# Patient Record
Sex: Male | Born: 1940 | Race: White | Hispanic: No | Marital: Married | Smoking: Former smoker
Health system: Southern US, Community
[De-identification: ages and names within clinical notes are randomized; demographics above are authoritative.]

## PROBLEM LIST (undated history)

## (undated) DIAGNOSIS — E785 Hyperlipidemia, unspecified: Secondary | ICD-10-CM

## (undated) DIAGNOSIS — Z860100 Personal history of colon polyps, unspecified: Secondary | ICD-10-CM

## (undated) DIAGNOSIS — M1990 Unspecified osteoarthritis, unspecified site: Secondary | ICD-10-CM

## (undated) DIAGNOSIS — C801 Malignant (primary) neoplasm, unspecified: Secondary | ICD-10-CM

## (undated) DIAGNOSIS — I493 Ventricular premature depolarization: Secondary | ICD-10-CM

## (undated) DIAGNOSIS — C4490 Unspecified malignant neoplasm of skin, unspecified: Secondary | ICD-10-CM

## (undated) DIAGNOSIS — J449 Chronic obstructive pulmonary disease, unspecified: Secondary | ICD-10-CM

## (undated) DIAGNOSIS — I8290 Acute embolism and thrombosis of unspecified vein: Secondary | ICD-10-CM

## (undated) DIAGNOSIS — Z1501 Genetic susceptibility to malignant neoplasm of breast: Secondary | ICD-10-CM

## (undated) DIAGNOSIS — Z800 Family history of malignant neoplasm of digestive organs: Secondary | ICD-10-CM

## (undated) DIAGNOSIS — T148XXA Other injury of unspecified body region, initial encounter: Secondary | ICD-10-CM

## (undated) DIAGNOSIS — Z973 Presence of spectacles and contact lenses: Secondary | ICD-10-CM

## (undated) DIAGNOSIS — N433 Hydrocele, unspecified: Secondary | ICD-10-CM

## (undated) DIAGNOSIS — I350 Nonrheumatic aortic (valve) stenosis: Secondary | ICD-10-CM

## (undated) DIAGNOSIS — T7840XA Allergy, unspecified, initial encounter: Secondary | ICD-10-CM

## (undated) DIAGNOSIS — R011 Cardiac murmur, unspecified: Secondary | ICD-10-CM

## (undated) DIAGNOSIS — Z1509 Genetic susceptibility to other malignant neoplasm: Principal | ICD-10-CM

## (undated) DIAGNOSIS — D649 Anemia, unspecified: Secondary | ICD-10-CM

## (undated) DIAGNOSIS — I6523 Occlusion and stenosis of bilateral carotid arteries: Secondary | ICD-10-CM

## (undated) DIAGNOSIS — Z803 Family history of malignant neoplasm of breast: Secondary | ICD-10-CM

## (undated) DIAGNOSIS — H9190 Unspecified hearing loss, unspecified ear: Secondary | ICD-10-CM

## (undated) DIAGNOSIS — I10 Essential (primary) hypertension: Secondary | ICD-10-CM

## (undated) HISTORY — DX: Family history of malignant neoplasm of breast: Z80.3

## (undated) HISTORY — PX: COLONOSCOPY: SHX174

## (undated) HISTORY — DX: Unspecified osteoarthritis, unspecified site: M19.90

## (undated) HISTORY — DX: Genetic susceptibility to malignant neoplasm of breast: Z15.01

## (undated) HISTORY — PX: TRANSTHORACIC ECHOCARDIOGRAM: SHX275

## (undated) HISTORY — DX: Family history of malignant neoplasm of digestive organs: Z80.0

## (undated) HISTORY — DX: Malignant (primary) neoplasm, unspecified: C80.1

## (undated) HISTORY — DX: Genetic susceptibility to other malignant neoplasm: Z15.09

## (undated) HISTORY — PX: STAPEDECTOMY: SHX2435

---

## 1960-06-07 HISTORY — PX: TONSILLECTOMY: SUR1361

## 1999-07-24 ENCOUNTER — Ambulatory Visit (HOSPITAL_COMMUNITY): Admission: RE | Admit: 1999-07-24 | Discharge: 1999-07-24 | Payer: Self-pay | Admitting: Orthopaedic Surgery

## 2001-02-24 ENCOUNTER — Other Ambulatory Visit: Admission: RE | Admit: 2001-02-24 | Discharge: 2001-02-24 | Payer: Self-pay | Admitting: Gastroenterology

## 2002-06-07 HISTORY — PX: SHOULDER SURGERY: SHX246

## 2004-04-09 ENCOUNTER — Ambulatory Visit: Payer: Self-pay | Admitting: Gastroenterology

## 2004-04-23 ENCOUNTER — Ambulatory Visit: Payer: Self-pay | Admitting: Gastroenterology

## 2005-12-07 ENCOUNTER — Ambulatory Visit (HOSPITAL_COMMUNITY): Admission: RE | Admit: 2005-12-07 | Discharge: 2005-12-07 | Payer: Self-pay | Admitting: Family Medicine

## 2010-08-25 ENCOUNTER — Encounter: Payer: Self-pay | Admitting: Cardiology

## 2010-09-04 ENCOUNTER — Encounter: Payer: Self-pay | Admitting: Cardiology

## 2010-09-04 ENCOUNTER — Ambulatory Visit (INDEPENDENT_AMBULATORY_CARE_PROVIDER_SITE_OTHER): Payer: Medicare Other | Admitting: Cardiology

## 2010-09-04 ENCOUNTER — Encounter: Payer: Self-pay | Admitting: *Deleted

## 2010-09-04 VITALS — BP 176/86 | HR 80 | Resp 18 | Ht 73.0 in | Wt 189.4 lb

## 2010-09-04 DIAGNOSIS — R0989 Other specified symptoms and signs involving the circulatory and respiratory systems: Secondary | ICD-10-CM | POA: Insufficient documentation

## 2010-09-04 DIAGNOSIS — I6529 Occlusion and stenosis of unspecified carotid artery: Secondary | ICD-10-CM

## 2010-09-04 DIAGNOSIS — R002 Palpitations: Secondary | ICD-10-CM

## 2010-09-04 DIAGNOSIS — R011 Cardiac murmur, unspecified: Secondary | ICD-10-CM

## 2010-09-04 LAB — TSH: TSH: 1.46 u[IU]/mL (ref 0.35–5.50)

## 2010-09-04 NOTE — Assessment & Plan Note (Signed)
Soft murmur at the apex, probably benign, will check to rule out HOCM or MVP

## 2010-09-04 NOTE — Progress Notes (Signed)
   Patient ID: Ronnie Nicholson, male    DOB: June 07, 1941, 70 y.o.   MRN: [id number]  HPI  Ronnie Nicholson is referred today for palpitation. He has had these for years off and on. I evaluated these 10 plus years ago and found them to be benign. They occur at rest or with lying down, never with activity. No chest pain, SOB, or DOE. He does not use excess caffeine or stimulants. Does not smoke. Lab work from Dr Gerda Diss reviewed yet do not see TSH. No active sxs to suggest hyperthyroidism. Outside EKG and today's EKG are normal.   Review of Systems  [all other systems reviewed and are negative      Physical Exam  Constitutional: He is oriented to person, place, and time. He appears well-developed and well-nourished.  HENT:  Head: Normocephalic and atraumatic.  Eyes: EOM are normal. Pupils are equal, round, and reactive to light.  Neck: Normal range of motion. No JVD present. No tracheal deviation present. No thyromegaly present.  Cardiovascular: Normal rate, regular rhythm and intact distal pulses.        Soft mid systolic murmur at apex  Pulmonary/Chest: Effort normal and breath sounds normal.       Pectus excavatum  Abdominal: Soft. Bowel sounds are normal.  Musculoskeletal: Normal range of motion. He exhibits no edema and no tenderness.  Neurological: He is alert and oriented to person, place, and time.  Skin: Skin is warm and dry.  Psychiatric: He has a normal mood and affect. His behavior is normal. Judgment and thought content normal.

## 2010-09-04 NOTE — Assessment & Plan Note (Signed)
Probably benign, 48hr holter monitor to confirm

## 2010-09-04 NOTE — Assessment & Plan Note (Signed)
Newly heard, check carotids

## 2010-09-04 NOTE — Patient Instructions (Signed)
Your physician recommends that you schedule a follow-up appointment in:As needed.  Your physician has recommended that you wear a holter monitor for 48 hours. Holter monitors are medical devices that record the heart's electrical activity. Doctors most often use these monitors to diagnose arrhythmias. Arrhythmias are problems with the speed or rhythm of the heartbeat. The monitor is a small, portable device. You can wear one while you do your normal daily activities. This is usually used to diagnose what is causing palpitations/syncope (passing out).  Your physician has requested that you have a carotid duplex. This test is an ultrasound of the carotid arteries in your neck. It looks at blood flow through these arteries that supply the brain with blood. Allow one hour for this exam. There are no restrictions or special instructions.  Your physician has requested that you have an echocardiogram. Echocardiography is a painless test that uses sound waves to create images of your heart. It provides your doctor with information about the size and shape of your heart and how well your heart's chambers and valves are working. This procedure takes approximately one hour. There are no restrictions for this procedure.  Your physician recommends that you return for lab work in: To have TSH drawn today-Dx 785.1.

## 2010-09-08 ENCOUNTER — Other Ambulatory Visit: Payer: Self-pay | Admitting: *Deleted

## 2010-09-08 ENCOUNTER — Encounter (INDEPENDENT_AMBULATORY_CARE_PROVIDER_SITE_OTHER): Payer: Medicare Other | Admitting: *Deleted

## 2010-09-08 ENCOUNTER — Ambulatory Visit (HOSPITAL_COMMUNITY): Payer: Medicare Other | Attending: Cardiology | Admitting: Radiology

## 2010-09-08 ENCOUNTER — Encounter (INDEPENDENT_AMBULATORY_CARE_PROVIDER_SITE_OTHER): Payer: Medicare Other

## 2010-09-08 DIAGNOSIS — I6529 Occlusion and stenosis of unspecified carotid artery: Secondary | ICD-10-CM

## 2010-09-08 DIAGNOSIS — R011 Cardiac murmur, unspecified: Secondary | ICD-10-CM | POA: Insufficient documentation

## 2010-09-08 DIAGNOSIS — R002 Palpitations: Secondary | ICD-10-CM

## 2010-09-14 ENCOUNTER — Encounter: Payer: Self-pay | Admitting: Cardiology

## 2010-09-15 ENCOUNTER — Telehealth: Payer: Self-pay | Admitting: *Deleted

## 2010-09-15 NOTE — Telephone Encounter (Signed)
Pt is aware of monitor results and pvcs.  We discussed this last week when he was in for his echo.  He states for the past 3-4 days he has felt fine with no ectopy.  He will call back if the pvcs become "worrisome or aggravating". Mylo Red RN

## 2010-10-20 ENCOUNTER — Ambulatory Visit (INDEPENDENT_AMBULATORY_CARE_PROVIDER_SITE_OTHER): Payer: Medicare Other | Admitting: Urology

## 2010-10-20 DIAGNOSIS — N4340 Spermatocele of epididymis, unspecified: Secondary | ICD-10-CM

## 2010-11-03 ENCOUNTER — Encounter: Payer: Self-pay | Admitting: Cardiology

## 2010-11-04 ENCOUNTER — Encounter: Payer: Self-pay | Admitting: Cardiology

## 2011-04-05 ENCOUNTER — Encounter: Payer: Self-pay | Admitting: Gastroenterology

## 2011-05-12 ENCOUNTER — Encounter: Payer: Self-pay | Admitting: Internal Medicine

## 2011-05-20 ENCOUNTER — Encounter: Payer: Self-pay | Admitting: Cardiology

## 2011-05-20 ENCOUNTER — Ambulatory Visit (INDEPENDENT_AMBULATORY_CARE_PROVIDER_SITE_OTHER): Payer: Medicare Other | Admitting: Cardiology

## 2011-05-20 VITALS — BP 136/74 | HR 77 | Ht 73.0 in | Wt 193.0 lb

## 2011-05-20 DIAGNOSIS — R011 Cardiac murmur, unspecified: Secondary | ICD-10-CM

## 2011-05-20 DIAGNOSIS — I493 Ventricular premature depolarization: Secondary | ICD-10-CM | POA: Insufficient documentation

## 2011-05-20 DIAGNOSIS — I350 Nonrheumatic aortic (valve) stenosis: Secondary | ICD-10-CM

## 2011-05-20 DIAGNOSIS — R002 Palpitations: Secondary | ICD-10-CM

## 2011-05-20 DIAGNOSIS — R0989 Other specified symptoms and signs involving the circulatory and respiratory systems: Secondary | ICD-10-CM

## 2011-05-20 DIAGNOSIS — I4949 Other premature depolarization: Secondary | ICD-10-CM

## 2011-05-20 DIAGNOSIS — I359 Nonrheumatic aortic valve disorder, unspecified: Secondary | ICD-10-CM

## 2011-05-20 DIAGNOSIS — I779 Disorder of arteries and arterioles, unspecified: Secondary | ICD-10-CM

## 2011-05-20 MED ORDER — METOPROLOL TARTRATE 25 MG PO TABS: 25.0000 mg | ORAL_TABLET | Freq: Two times a day (BID) | ORAL | Status: DC

## 2011-05-20 NOTE — Assessment & Plan Note (Signed)
These are clearly related to PVCs. Will begin metoprolol tartrate 25 mg p.o. Twice a day. Hopefully, they will decrease by 75% or more. Expectations realistically given the patient

## 2011-05-20 NOTE — Patient Instructions (Signed)
Your physician has recommended you make the following change in your medication:  start Metoprolol  Your physician wants you to follow-up in: 4 months with Dr. Daleen Squibb. ( April) You will receive a reminder letter in the mail two months in advance. If you don't receive a letter, please call our office to schedule the follow-up appointment.

## 2011-05-20 NOTE — Progress Notes (Signed)
HPI  Mr. Ronnie Nicholson comes in today for increased palpitations. It almost daily over the last couple weeks. He only notices when he sitting down or lying down.  He has mild aortic stenosis and normal left ventricular function by echocardiogram in April of this year. He is nonobstructive carotid disease. We'll monitor x48 hours showed noncomplex premature ventricular contractions. Reassurance was given. At the time he was relatively asymptomatic. Past Medical History  Diagnosis Date  . Irregular heart rate   . Colon polyps   . Hypercholesteremia     Current Outpatient Prescriptions  Medication Sig Dispense Refill  . aspirin 81 MG tablet Take 81 mg by mouth daily.        . fish oil-omega-3 fatty acids 1000 MG capsule Take by mouth daily. 1200mg  daily       . Pitavastatin Calcium (LIVALO) 1 MG TABS Take 1 mg by mouth daily.        . metoprolol tartrate (LOPRESSOR) 25 MG tablet Take 1 tablet (25 mg total) by mouth 2 (two) times daily.  180 tablet  3    Allergies  Allergen Reactions  . Flexeril (Cyclobenzaprine Hcl)   . Lodine (Etodolac)   . Tetracyclines & Related     No family history on file.  History   Social History  . Marital Status: Married    Spouse Name: N/A    Number of Children: N/A  . Years of Education: N/A   Occupational History  . retired     08-2003   Social History Main Topics  . Smoking status: Former Games developer  . Smokeless tobacco: Not on file   Comment: quit in 1985  . Alcohol Use: Not on file  . Drug Use: Not on file  . Sexually Active: Not on file   Other Topics Concern  . Not on file   Social History Narrative  . No narrative on file    ROS ALL NEGATIVE EXCEPT THOSE NOTED IN HPI  PE  General Appearance: well developed, well nourished in no acute distress HEENT: symmetrical face, PERRLA, good dentition  Neck: no JVD, thyromegaly, or adenopathy, trachea midline Chest: symmetric without deformity Cardiac: PMI non-displaced, Irregular rate and  rhythm normal S1, S2, no gallop or murmur Lung: clear to ausculation and percussion Vascular: all pulses full without bruits  Abdominal: nondistended, nontender, good bowel sounds, no HSM, no bruits Extremities: no cyanosis, clubbing or edema, no sign of DVT, no varicosities  Skin: normal color, no rashes Neuro: alert and oriented x 3, non-focal Pysch: normal affect  EKG Normal sinus rhythm with frequent ventricular ectopic beats, unifocal BMET No results found for this basename: na, k, cl, co2, glucose, bun, creatinine, calcium, gfrnonaa, gfraa    Lipid Panel  No results found for this basename: chol, trig, hdl, cholhdl, vldl, ldlcalc    CBC No results found for this basename: wbc, rbc, hgb, hct, plt, mcv, mch, mchc, rdw, neutrabs, lymphsabs, monoabs, eosabs, basosabs

## 2011-06-18 ENCOUNTER — Ambulatory Visit (AMBULATORY_SURGERY_CENTER): Payer: Medicare Other | Admitting: *Deleted

## 2011-06-18 VITALS — Ht 73.0 in | Wt 197.6 lb

## 2011-06-18 DIAGNOSIS — Z1211 Encounter for screening for malignant neoplasm of colon: Secondary | ICD-10-CM

## 2011-06-18 MED ORDER — PEG-KCL-NACL-NASULF-NA ASC-C 100 G PO SOLR: ORAL | Status: DC

## 2011-07-01 ENCOUNTER — Ambulatory Visit (AMBULATORY_SURGERY_CENTER): Payer: Medicare Other | Admitting: Internal Medicine

## 2011-07-01 ENCOUNTER — Encounter: Payer: Self-pay | Admitting: Internal Medicine

## 2011-07-01 VITALS — BP 159/81 | HR 98 | Temp 97.9°F | Resp 16 | Ht 73.0 in | Wt 197.0 lb

## 2011-07-01 DIAGNOSIS — Z1211 Encounter for screening for malignant neoplasm of colon: Secondary | ICD-10-CM

## 2011-07-01 DIAGNOSIS — Z860100 Personal history of colon polyps, unspecified: Secondary | ICD-10-CM | POA: Insufficient documentation

## 2011-07-01 DIAGNOSIS — D126 Benign neoplasm of colon, unspecified: Secondary | ICD-10-CM

## 2011-07-01 DIAGNOSIS — Z800 Family history of malignant neoplasm of digestive organs: Secondary | ICD-10-CM

## 2011-07-01 MED ORDER — SODIUM CHLORIDE 0.9 % IV SOLN: 500.0000 mL | INTRAVENOUS | Status: DC

## 2011-07-01 NOTE — Patient Instructions (Addendum)
One tiny 2 mm polyp was removed. Not to worry. Will send a letter about the results. Can consider a repeat colonoscopy in 5 years or so given your personal history of polyps and your family history. Iva Boop, MD, [facility]  Green and blue discharge instructions reviewed with patient and care partner.  Handouts given for polyps and hemorrhoids.  Resume medications as you were taking them prior to your procedure.

## 2011-07-01 NOTE — Progress Notes (Signed)
Patient did not experience any of the following events: a burn prior to discharge; a fall within the facility; wrong site/side/patient/procedure/implant event; or a hospital transfer or hospital admission upon discharge from the facility. (G8907) Patient did not have preoperative order for IV antibiotic SSI prophylaxis. (G8918)  

## 2011-07-01 NOTE — Op Note (Signed)
[facility] [address] [place], [place]  [address]  COLONOSCOPY PROCEDURE REPORT  PATIENT:  Ronnie Nicholson, Ronnie Nicholson  MR#:  [id number] BIRTHDATE:  1940-12-18, 70 yrs. old  GENDER:  male ENDOSCOPIST:  Iva Boop, MD, [facility]  PROCEDURE DATE:  07/01/2011 PROCEDURE:  Colonoscopy with biopsy ASA CLASS:  Class II INDICATIONS:  surveillance and high-risk screening, history of pre-cancerous (adenomatous) colon polyps, family history of colon cancer MEDICATIONS:   These medications were titrated to patient response per physician's verbal order, Fentanyl 50 mcg IV, Versed 6 mg IV  DESCRIPTION OF PROCEDURE:   After the risks benefits and alternatives of the procedure were thoroughly explained, informed consent was obtained.  Digital rectal exam was performed and revealed no abnormalities and normal prostate.   The [id number] endoscope was introduced through the anus and advanced to the cecum, which was identified by both the appendix and ileocecal valve, without limitations.  The quality of the prep was excellent, using MoviPrep.  The instrument was then slowly withdrawn as the colon was fully examined. <<PROCEDUREIMAGES>>  FINDINGS:  A diminutive polyp was found at the splenic flexure. 2 mm polyp removed with cold biopsy.  This was otherwise a normal examination of the colon.   Retroflexed views in the rectum revealed internal and external hemorrhoids.    The time to cecum = 2:58 minutes. The scope was then withdrawn in 10:37 minutes from the cecum and the procedure completed. COMPLICATIONS:  None ENDOSCOPIC IMPRESSION: 1) Diminutive polyp at the splenic flexure - removed 2) Internal and external hemorrhoids 3) Otherwise normal examination, excellent prep 4) Personal history of adenomas and family history colon cancer (father and brother)  REPEAT EXAM:  In for Colonoscopy, pending biopsy results. Consider repeat in 5 years likely  Iva Boop, MD, [facility]  CC:  The  Patient and Simone Curia, Md  n. eSIGNED:   Iva Boop at 07/01/2011 09:50 AM  Wilhelmenia Blase, [phone]

## 2011-07-02 ENCOUNTER — Telehealth: Payer: Self-pay

## 2011-07-02 NOTE — Telephone Encounter (Signed)

## 2011-07-07 ENCOUNTER — Encounter: Payer: Self-pay | Admitting: Internal Medicine

## 2011-07-07 NOTE — Progress Notes (Signed)
Quick Note:  Small adenoma and prior adenomas + family hx colon cancer Repeat colonoscopy approx 5 years 06/2016 ______

## 2011-10-07 ENCOUNTER — Other Ambulatory Visit: Payer: Self-pay | Admitting: *Deleted

## 2011-10-07 DIAGNOSIS — I6529 Occlusion and stenosis of unspecified carotid artery: Secondary | ICD-10-CM

## 2011-10-08 ENCOUNTER — Encounter (INDEPENDENT_AMBULATORY_CARE_PROVIDER_SITE_OTHER): Payer: Medicare Other

## 2011-10-08 DIAGNOSIS — I6529 Occlusion and stenosis of unspecified carotid artery: Secondary | ICD-10-CM

## 2011-10-15 ENCOUNTER — Encounter: Payer: Self-pay | Admitting: Cardiology

## 2011-10-15 ENCOUNTER — Ambulatory Visit (INDEPENDENT_AMBULATORY_CARE_PROVIDER_SITE_OTHER): Payer: Medicare Other | Admitting: Cardiology

## 2011-10-15 VITALS — BP 148/78 | HR 62 | Ht 73.0 in | Wt 195.0 lb

## 2011-10-15 DIAGNOSIS — I359 Nonrheumatic aortic valve disorder, unspecified: Secondary | ICD-10-CM

## 2011-10-15 DIAGNOSIS — R002 Palpitations: Secondary | ICD-10-CM

## 2011-10-15 DIAGNOSIS — I4949 Other premature depolarization: Secondary | ICD-10-CM

## 2011-10-15 DIAGNOSIS — I350 Nonrheumatic aortic (valve) stenosis: Secondary | ICD-10-CM

## 2011-10-15 DIAGNOSIS — I779 Disorder of arteries and arterioles, unspecified: Secondary | ICD-10-CM

## 2011-10-15 DIAGNOSIS — I493 Ventricular premature depolarization: Secondary | ICD-10-CM

## 2011-10-15 NOTE — Assessment & Plan Note (Signed)
These are stable. He only has it at rest. Continue low-dose beta blocker. No further cardiac workup at this time. Patient is one developed symptoms of angina or ischemia and how to notify us. I will schedule followup in a year.

## 2011-10-15 NOTE — Assessment & Plan Note (Signed)
Stable by history and exam. Repeat echocardiogram next year return visit. That'll be 2 years out from his last study.

## 2011-10-15 NOTE — Patient Instructions (Signed)
Your physician has requested that you have a carotid duplex. This test is an ultrasound of the carotid arteries in your neck. It looks at blood flow through these arteries that supply the brain with blood. Allow one hour for this exam. There are no restrictions or special instructions. May 2014  Your physician recommends that you continue on your current medications as directed. Please refer to the Current Medication list given to you today.  Your physician wants you to follow-up in: 1 year. You will receive a reminder letter in the mail two months in advance. If you don't receive a letter, please call our office to schedule the follow-up appointment.

## 2011-10-15 NOTE — Progress Notes (Signed)
HPI  Ronnie Nicholson comes in today for evaluation and management of his symptomatic PVCs which cause her to have palpitations, mild aortic stenosis on echocardiogram last year, and bilateral carotid artery disease.  He works extremely hard and is [place] in form. He denies any chest tightness or ischemic equivalent symptoms.  He recently visited his ophthalmologist. Complained of having some spots in the right peripheral vision that lasted for about 10 minutes. He denies any loss of vision or amaurosis. His ophthalmologist thought it might be from his carotid disease. Carotids were just obtained on May 3 and showed slight progression of his plaque but no unstable plaque or irregularity. He is on a statin as well as aspirin. He denies any other symptoms of a TIA or mini stroke. Past Medical History  Diagnosis Date  . Irregular heart rate   . Colon polyps   . Hypercholesteremia   . Arthritis     Current Outpatient Prescriptions  Medication Sig Dispense Refill  . aspirin 81 MG tablet Take 81 mg by mouth daily.        . metoprolol tartrate (LOPRESSOR) 25 MG tablet Take 1 tablet (25 mg total) by mouth 2 (two) times daily.  180 tablet  3  . Pitavastatin Calcium (LIVALO) 1 MG TABS Take 1 mg by mouth daily.         Current Facility-Administered Medications  Medication Dose Route Frequency Provider Last Rate Last Dose  . 0.9 %  sodium chloride infusion  500 mL Intravenous Continuous Iva Boop, MD        Allergies  Allergen Reactions  . Flexeril (Cyclobenzaprine Hcl)     unspecified  . Lodine (Etodolac)     unspecified  . Tetracyclines & Related     unspecified    Family History  Problem Relation Age of Onset  . Colon cancer Father 34  . Colon cancer Brother 70    History   Social History  . Marital Status: Married    Spouse Name: N/A    Number of Children: N/A  . Years of Education: N/A   Occupational History  . retired     08-2003   Social History Main Topics  .  Smoking status: Former Smoker    Quit date: 06/18/1983  . Smokeless tobacco: Never Used   Comment: quit in 1985  . Alcohol Use: No  . Drug Use: No  . Sexually Active: Not on file   Other Topics Concern  . Not on file   Social History Narrative  . No narrative on file    ROS ALL NEGATIVE EXCEPT THOSE NOTED IN HPI  PE  General Appearance: well developed, well nourished in no acute distress HEENT: symmetrical face, PERRLA, good dentition  Neck: no JVD, thyromegaly, or adenopathy, trachea midline Chest: symmetric without deformity Cardiac: PMI non-displaced, RRR, normal S1, S2, no gallop or murmur Lung: clear to ausculation and percussion Vascular: all pulses full without bruits  Abdominal: nondistended, nontender, good bowel sounds, no HSM, no bruits Extremities: no cyanosis, clubbing or edema, no sign of DVT, no varicosities  Skin: normal color, no rashes Neuro: alert and oriented x 3, non-focal Pysch: normal affect  EKG  BMET No results found for this basename: na, k, cl, co2, glucose, bun, creatinine, calcium, gfrnonaa, gfraa    Lipid Panel  No results found for this basename: chol, trig, hdl, cholhdl, vldl, ldlcalc    CBC No results found for this basename: wbc, rbc, hgb, hct, plt, mcv, mch, mchc,  rdw, neutrabs, lymphsabs, monoabs, eosabs, basosabs

## 2011-10-15 NOTE — Assessment & Plan Note (Signed)
His symptoms do not indicate a TIA from his carotid disease. Last carotids showed slight progression but no unstable plaque the ultrasound. Continue aggressive secondary prevention. Symptoms of amaurosis and TIA reviewed with patient.

## 2012-05-10 ENCOUNTER — Other Ambulatory Visit: Payer: Self-pay | Admitting: Cardiology

## 2012-05-10 DIAGNOSIS — I6529 Occlusion and stenosis of unspecified carotid artery: Secondary | ICD-10-CM

## 2012-05-11 ENCOUNTER — Encounter (INDEPENDENT_AMBULATORY_CARE_PROVIDER_SITE_OTHER): Payer: Medicare Other

## 2012-05-11 DIAGNOSIS — I6529 Occlusion and stenosis of unspecified carotid artery: Secondary | ICD-10-CM

## 2012-06-13 ENCOUNTER — Other Ambulatory Visit: Payer: Self-pay | Admitting: *Deleted

## 2012-06-13 MED ORDER — METOPROLOL TARTRATE 25 MG PO TABS: 25.0000 mg | ORAL_TABLET | Freq: Two times a day (BID) | ORAL | Status: DC

## 2012-07-11 ENCOUNTER — Ambulatory Visit (INDEPENDENT_AMBULATORY_CARE_PROVIDER_SITE_OTHER): Payer: Medicare Other | Admitting: Urology

## 2012-07-11 DIAGNOSIS — N433 Hydrocele, unspecified: Secondary | ICD-10-CM

## 2012-10-16 ENCOUNTER — Ambulatory Visit: Payer: Medicare Other | Admitting: Physician Assistant

## 2012-11-13 ENCOUNTER — Ambulatory Visit (HOSPITAL_COMMUNITY)
Admission: RE | Admit: 2012-11-13 | Discharge: 2012-11-13 | Disposition: A | Discharge status: Home / Self Care | Payer: Medicare Other | Source: Ambulatory Visit | Attending: Orthopedic Surgery | Admitting: Orthopedic Surgery

## 2012-11-13 DIAGNOSIS — M542 Cervicalgia: Secondary | ICD-10-CM | POA: Insufficient documentation

## 2012-11-13 DIAGNOSIS — M436 Torticollis: Secondary | ICD-10-CM | POA: Insufficient documentation

## 2012-11-13 DIAGNOSIS — IMO0001 Reserved for inherently not codable concepts without codable children: Secondary | ICD-10-CM | POA: Insufficient documentation

## 2012-11-13 NOTE — Evaluation (Addendum)
Physical Therapy Evaluation  Patient Details  Name: Ronnie Nicholson MRN: [id number] Date of Birth: 1940/06/18  Today's Date: 11/13/2012 Time: 0850-0930 PT Time Calculation (min): 40 min Charges: Evaluation: 1 TE: [id number]             Visit#: 1 of 8  Re-eval: 12/13/12 Assessment Diagnosis: Neck pain Next MD Visit: Dr. Shon Baton - unscheduled Prior Therapy: For Rt RTC repair - 10-12 years ago  Authorization: Medicare    Authorization Time Period:    Authorization Visit#: 1 of 8   Past Medical History:  Past Medical History  Diagnosis Date  . Irregular heart rate   . Colon polyps   . Hypercholesteremia   . Arthritis    Past Surgical History:  Past Surgical History  Procedure Laterality Date  . Stapedectomy  both ears    1970  . Tonsillectomy  1962  . Shoulder surgery  1962    Subjective Symptoms/Limitations Symptoms: significant PMH: Rt RTC surgery, possible Lt RTC tear (did not repair secondary to improved pain.) Pertinent History: Pt is referred to PT for neck pain.  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  He reports that he does not take any pain medication and has not tried ice or heat to help with the pain.  Limitations:  (difficulty turning head to the Left to see out his window)  Limitations:  (difficulty turning head to the Left to see out his window) Patient Stated Goals: get relief for neck pain. 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 : No Is the patient reluctant to leave their home because of a fear of falling? : No  Prior Functioning  Home Living Lives With: Spouse Prior Function Vocation: Retired Comments: Enjoys working on Financial controller  Cognition/Observation Observation/Other Assessments Observations: decreased flexibility to LUE hand lift off back  Sensation/Coordination/Flexibility/Functional Tests Functional Tests Functional Tests: Neck Disability Index (NDI): 10%  Assessment RUE Strength RUE Overall Strength Comments: middle trapesius: 4/5, lower trapezius 4/5 Right Shoulder Flexion: 5/5 Right Shoulder Extension: 5/5 Right Shoulder ABduction: 5/5 Right Shoulder Internal Rotation: 4/5 Right Shoulder External Rotation: 4/5 LUE Strength LUE Overall Strength Comments: lower trapezius: 3-/5, middle trapezius: 4/5 Cervical AROM Cervical Flexion: 6 Cervical Extension: 10 Cervical - Right Side Bend: 21 Cervical - Left Side Bend: 20 Cervical - Right Rotation: 23 Cervical - Left Rotation: 22.5 Palpation Palpation: decreased thoracic PA mobility T1-T9, moderat fascial restrictions to Rt first rib with pain and tenderness, mild fascial restrictins to Rt Upper trapezius,   Mobility/Balance  Posture/Postural Control Posture/Postural Control: Postural limitations Postural Limitations: ridgid thoracic spine, mild kyphosis   Exercise/Treatments Stretches Upper Trapezius Stretch: 1 rep;20 seconds Levator Stretch: 1 rep;20 seconds Seated Exercises Cervical Isometrics: Flexion;5 secs;Other (comment) (3 reps) Neck Retraction: 5 reps Cervical Rotation: Both;5 reps Shoulder Rolls: Backwards;10 reps Other Seated Exercise: scapula retraction: 10 reps    Physical Therapy Assessment and Plan PT Assessment and Plan Clinical Impression Statement:  Pt is a 72 year old male referred to PT for cervical pain with impairments listed below.  After evaluation it is found  his restritions are greatest to thoracic region likely causing decreased mobility and increased pain to cervical spibne.  Pt will benefit from skilled therapeutic intervention in order to improve on the following deficits: Pain;Decreased strength;Decreased coordination;Impaired flexibility;Decreased range of motion;Decreased mobility;Increased fascial restricitons Rehab Potential: Good PT Frequency: Min 2X/week PT Duration: 4 weeks PT Treatment/Interventions: Therapeutic activities;Therapeutic exercise;Neuromuscular re-education;Patient/family education;Manual techniques PT Plan: Manual techniques to decrease cervical and thoracic and first rib pain and improve clavicalar, cervical and thoracic mobility, progress to stretching activities and supine neck retraction, cervical rotation.  Progress to POE and prone shoulder activities.     Goals Home Exercise Program Pt will Perform Home Exercise Program: Independently PT Goal: Perform Home Exercise Program - Progress: Goal set today PT Short Term Goals Time to Complete Short Term Goals: 2 weeks PT Short Term Goal 1: Pt will improve cervical AROM by 3 cm in rotation and lateral sidebending for greater ease when looking out his window.  PT Short Term Goal 2: Pt will improve thoracic and cervical mobility in order to decrease fascial restrictions to anterior ribs, cervical region and posterior thoracic and cervical region.  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  PT Long Term Goal 2: Pt will report pain less than 1/10 for 80% of his day for improved QOL.  Long Term Goal 3: Pt will improve cervical coordination in order to lift his head with greater ease to decrease pain to upper trapezius region Long Term Goal 4: Pt will report improved  feeling to Rt fingers.   Problem List Patient Active Problem List   Diagnosis Date Noted  . Neck pain 11/13/2012  . Neck stiffness 11/13/2012  . Muscle pain, cervical 11/13/2012  . Personal history of colonic polyps - adenomatous 07/01/2011  . Symptomatic premature ventricular contractions 05/20/2011  . Mild aortic stenosis 05/20/2011  . Bilateral carotid artery disease 05/20/2011  . Palpitations 09/04/2010    General Behavior During Therapy: [facility] for tasks assessed/performed Cognition: [facility] for tasks performed PT Plan of Care PT Home Exercise Plan: see scanned report PT Patient Instructions: importance of HEP, POC and answered questions about diagnosis.  Consulted and Agree with Plan of Care: Patient  GP Functional Assessment Tool Used: NDI: 10% and clinical observation Functional Limitation: Self care Self Care Current Status (H8469): At least 20 percent but less than 40 percent impaired, limited or restricted Self Care Goal Status (G2952): At least 1 percent but less than 20 percent impaired, limited or restricted  Delynn Olvera, MPT, ATC 11/13/2012, 10:21 AM  Physician Documentation Your signature is required to indicate approval of the treatment plan as stated above.  Please sign and either send electronically or make a copy of this report for your files and return this physician signed original.   Please mark one 1.__approve of plan  2. ___approve of plan with the following conditions.   ______________________________  _____________________ Physician Signature                                                                                                             Date

## 2012-11-15 ENCOUNTER — Ambulatory Visit (HOSPITAL_COMMUNITY)
Admission: RE | Admit: 2012-11-15 | Discharge: 2012-11-15 | Disposition: A | Discharge status: Home / Self Care | Payer: Medicare Other | Source: Ambulatory Visit | Attending: Family Medicine | Admitting: Family Medicine

## 2012-11-15 NOTE — Progress Notes (Signed)
Physical Therapy Treatment Patient Details  Name: Ronnie Nicholson MRN: [id number] Date of Birth: [birth year]  Today's Date: 11/15/2012 Time: 1308-6578 PT Time Calculation (min): 40 min  Visit#: 2 of 8  Re-eval: 12/13/12 Charges: Therex x 26' Manual x 8' MHP x 1   Authorization: Medicare  Authorization Visit#: 2 of 8   Subjective: Symptoms/Limitations Symptoms: Pt states that he only has pain with certain movements. Pain Assessment Currently in Pain?: No/denies Pain Score: 0-No pain   Exercise/Treatments Stretches Upper Trapezius Stretch: 2 reps;30 seconds Levator Stretch: 2 reps;30 seconds Theraband Exercises Scapula Retraction: 10 reps;Green Shoulder Extension: 10 reps;Green Rows: 10 reps;Green Seated Exercises Cervical Rotation: 10 reps;Both Lateral Flexion: 10 reps W Back: 10 reps Shoulder Rolls: 10 reps  Modalities Modalities: Moist Heat Manual Therapy Manual Therapy: Myofascial release Myofascial Release: MFR throughout cervical musculature; gentle manual cervical traction (while on MHP) Moist Heat Therapy Number Minutes Moist Heat: 12 Minutes Moist Heat Location:  (Cervical)  Physical Therapy Assessment and Plan PT Assessment and Plan Clinical Impression Statement: Pt completes therex well after initial cueing and demo. Pt's cervical movements are limited by muscle tightness. Applied heat pack and completed manual techniques to cervical area to decrease pain and tightness. Pt reports decrease tightness and displays improved AROM at end of session. Pt will benefit from skilled therapeutic intervention in order to improve on the following deficits: Pain;Decreased strength;Decreased coordination;Impaired flexibility;Decreased range of motion;Decreased mobility;Increased fascial restricitons Rehab Potential: Good PT Frequency: Min 2X/week PT Duration: 4 weeks PT Treatment/Interventions: Therapeutic activities;Therapeutic exercise;Neuromuscular  re-education;Patient/family education;Manual techniques PT Plan: Manual techniques to decrease cervical and thoracic and first rib pain and improve clavicalar, cervical and thoracic mobility, progress to stretching activities and supine neck retraction, cervical rotation.  Progress to POE and prone shoulder activities.     Problem List Patient Active Problem List   Diagnosis Date Noted  . Neck pain 11/13/2012  . Neck stiffness 11/13/2012  . Muscle pain, cervical 11/13/2012  . Personal history of colonic polyps - adenomatous 07/01/2011  . Symptomatic premature ventricular contractions 05/20/2011  . Mild aortic stenosis 05/20/2011  . Bilateral carotid artery disease 05/20/2011  . Palpitations 09/04/2010    General Behavior During Therapy: [facility] for tasks assessed/performed Cognition: [facility] for tasks performed  Seth Bake, PTA 11/15/2012, 11:06 AM

## 2012-11-15 NOTE — Progress Notes (Deleted)
Physical Therapy - Wound Therapy  Evaluation   Patient Details  Name: Ronnie Nicholson MRN: [id number] Date of Birth: 08/06/1940  Today's Date: 11/15/2012 Time: 4098-1191 Time Calculation (min): 25 min  Visit#:   of    Re-eval:    Subjective Subjective Assessment Subjective: Pt states that he has had this wound for about two months.  He has been on two bouts of antibiotics.  He went to his podiatrist who referred him to his diabetic MD who referred him back to his primary MD who referred him to wound care as well as back to Dr. Nolen Mu.  Dr. Nolen Mu called a vascular surgeon who he has an appointment with on 11/22/2012.  The vascular surgeon stated not to debride the wound until he has assessed the wound.   Patient and Family Stated Goals: For the wound to heal.   Date of Onset: 09/15/12 Prior Treatments: Pt currently is suppose to be soaking his foot in betadine and keeping the foot dry.    Pain Assessment Pain Assessment Pain Assessment: No/denies pain Pain Score: 0-No pain  Wound Therapy Wound 11/15/12 Other (Comment) Toe (Comment  which one) Left (Active)  Site / Wound Assessment Black 11/15/2012  9:42 AM  % Wound base Red or Granulating 0% 11/15/2012  9:42 AM  % Wound base Yellow 0% 11/15/2012  9:42 AM  % Wound base Black 100% 11/15/2012  9:42 AM  Peri-wound Assessment Erythema (non-blanchable) 11/15/2012  9:42 AM  Wound Length (cm) 1 cm 11/15/2012  9:42 AM  Wound Width (cm) 1 cm 11/15/2012  9:42 AM  Closure None 11/15/2012  9:42 AM  Drainage Amount None 11/15/2012  9:42 AM  Non-staged Wound Description Not applicable 11/15/2012  9:42 AM  Dressing Status None 11/15/2012  9:42 AM       Physical Therapy Assessment and Plan Wound Therapy - Assess/Plan/Recommendations Wound Therapy - Clinical Statement: Pt whos history includes several back surgeries; two CABG and several stents who has a nonhealing wound along the medial aspect of his LT great toe.  Pt status is complicated as he  has both arteriol and venous insufficency.  Pt has been referred to a vascular surgeon therefore we will hold treatment until the vascular surgeon assess the wound. Factors Delaying/Impairing Wound Healing: Diabetes Mellitus;Vascular compromise Hydrotherapy Plan: Other (comment) (await vascular surgeon assessment.) Wound Therapy - Current Recommendations: PT Wound Plan: await order from vascular surgeon      Goals Wound Therapy Goals - Improve the function of patient's integumentary system by progressing the wound(s) through the phases of wound healing by: Decrease Necrotic Tissue to: 0 Decrease Necrotic Tissue - Progress: Goal set today Increase Granulation Tissue to: 100 Increase Granulation Tissue - Progress: Goal set today Decrease Length/Width/Depth by (cm): 0 Decrease Length/Width/Depth - Progress: Goal set today Patient/Family will be able to : understand the risk of infection  Problem List Patient Active Problem List   Diagnosis Date Noted  . Neck pain 11/13/2012  . Neck stiffness 11/13/2012  . Muscle pain, cervical 11/13/2012  . Personal history of colonic polyps - adenomatous 07/01/2011  . Symptomatic premature ventricular contractions 05/20/2011  . Mild aortic stenosis 05/20/2011  . Bilateral carotid artery disease 05/20/2011  . Palpitations 09/04/2010    GP    RUSSELL,CINDY 11/15/2012, 9:48 AM  Your signature is required to indicate approval of the treatment plan as stated above.  Please sign and return making a copy for your files.  You may hard copy or send electronically.  Please check  one: ___1.  Approve of this plan  ___2.  Approve of this plan with the following changes.   ____________________________                             _____________ Physician                                                                      Date

## 2012-11-17 ENCOUNTER — Encounter (INDEPENDENT_AMBULATORY_CARE_PROVIDER_SITE_OTHER): Payer: Medicare Other

## 2012-11-17 DIAGNOSIS — I6529 Occlusion and stenosis of unspecified carotid artery: Secondary | ICD-10-CM

## 2012-11-20 ENCOUNTER — Ambulatory Visit (HOSPITAL_COMMUNITY)
Admission: RE | Admit: 2012-11-20 | Discharge: 2012-11-20 | Disposition: A | Discharge status: Home / Self Care | Payer: Medicare Other | Source: Ambulatory Visit | Attending: Orthopedic Surgery | Admitting: Orthopedic Surgery

## 2012-11-20 NOTE — Evaluation (Signed)
Physical Therapy Discharge Summary  Patient Details  Name: Ronnie Nicholson MRN: [id number] Date of Birth: 05/19/1941  Today's Date: 11/20/2012 Time: 8295-6213 PT Time Calculation (min): 30 min Charges: MMT x 1 Self care x 18'              Visit#: 3 of 8  Re-eval: 12/13/12 Assessment Diagnosis: Neck pain Next MD Visit: Dr. Shon Baton - unscheduled Prior Therapy: For Rt RTC repair - 10-12 years ago  Authorization: Medicare    Authorization Visit#: 3 of 8   Past Medical History:  Past Medical History  Diagnosis Date  . Irregular heart rate   . Colon polyps   . Hypercholesteremia   . Arthritis    Past Surgical History:  Past Surgical History  Procedure Laterality Date  . Stapedectomy  both ears    1970  . Tonsillectomy  1962  . Shoulder surgery  1962    Subjective Symptoms/Limitations Symptoms: Pt states that therapy has really helped. He feels he is ready for discharge to HEP. Pain Assessment Currently in Pain?: No/denies  Sensation/Coordination/Flexibility/Functional Tests Functional Tests Functional Tests: Neck Disability Index (NDI): 2% (was 10%)  Assessment RUE Strength RUE Overall Strength Comments: middle trapesius: 5/5, lower trapezius 5/5 (both were 4/5) Right Shoulder Flexion: 5/5 Right Shoulder Extension: 5/5 Right Shoulder ABduction: 5/5 Right Shoulder Internal Rotation: 5/5 Right Shoulder External Rotation: 5/5 LUE Strength LUE Overall Strength Comments: lower trapezius: 5/5 , middle trapezius: 5/5 (lower was 3-/5, middle was 4/5) Cervical AROM Cervical Flexion: 3 (was 6) Cervical Extension: 7.5 (was 10) Cervical - Right Side Bend: 19.5 (was 21) Cervical - Left Side Bend: 18 (was 20) Cervical - Right Rotation: 20.5 (was 23) Cervical - Left Rotation: 18 (was 22.5)  Exercise/Treatments Machines for Strengthening UBE (Upper Arm Bike): 6'@2 .0 Prone Exercises Other Prone Exercise: POE cervical AROM Other Prone Exercise: POE retraction and  protraction  Physical Therapy Assessment and Plan PT Assessment and Plan Clinical Impression Statement: Pt has progressed well with therapy. Pt displays improved ROM and strength. All goals have been met except for ROM goal which has been partly met. Pt is independent with HEP and comfortable with D/C. Pt will benefit from skilled therapeutic intervention in order to improve on the following deficits: Pain;Decreased strength;Decreased coordination;Impaired flexibility;Decreased range of motion;Decreased mobility;Increased fascial restricitons Rehab Potential: Good PT Frequency: Min 2X/week PT Duration: 4 weeks PT Treatment/Interventions: Therapeutic activities;Therapeutic exercise;Neuromuscular re-education;Patient/family education;Manual techniques PT Plan: Recommend D/C to HEP per pt request.    Goals Home Exercise Program Pt will Perform Home Exercise Program: Independently PT Short Term Goals Time to Complete Short Term Goals: 2 weeks PT Short Term Goal 1: Pt will improve cervical AROM by 3 cm in rotation and lateral sidebending for greater ease when looking out his window.  PT Short Term Goal 1 - Progress: Partly met PT Short Term Goal 2: Pt will improve thoracic and cervical mobility in order to decrease fascial restrictions to anterior ribs, cervical region and posterior thoracic and cervical region.  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  PT Long Term Goal 1 - Progress: Met PT Long Term Goal 2: Pt will report pain less than 1/10 for 80% of his day for improved QOL.  PT Long Term Goal 2 - Progress: Met Long Term Goal 3: Pt will improve cervical coordination in order to lift his head with greater ease to decrease pain to upper  trapezius region Long Term Goal 3 Progress: Met Long Term Goal 4: Pt will report improved feeling to Rt fingers.  Long Term Goal 4 Progress: Met  Problem List Patient Active Problem List   Diagnosis Date Noted  . Neck pain 11/13/2012  . Neck stiffness 11/13/2012  . Muscle pain, cervical 11/13/2012  . Personal history of colonic polyps - adenomatous 07/01/2011  . Symptomatic premature ventricular contractions 05/20/2011  . Mild aortic stenosis 05/20/2011  . Bilateral carotid artery disease 05/20/2011  . Palpitations 09/04/2010    General Behavior During Therapy: [facility] for tasks assessed/performed Cognition: [facility] for tasks performed PT Plan of Care PT Home Exercise Plan: Given for scapular tband exercises, POE cervical AROM, and POE retraction and protraction.   GP Functional Assessment Tool Used: NDI: 1% and clinical observation Functional Limitation: Self care Self Care Goal Status (Z6109): At least 1 percent but less than 20 percent impaired, limited or restricted Self Care Discharge Status [phone]): At least 1 percent but less than 20 percent impaired, limited or restricted  Seth Bake, PTA  11/20/2012, 9:34 AM  Physician Documentation Your signature is required to indicate approval of the treatment plan as stated above.  Please sign and either send electronically or make a copy of this report for your files and return this physician signed original.   Please mark one 1.__approve of plan  2. ___approve of plan with the following conditions.   ______________________________                                                          _____________________ Physician Signature                                                                                                             Date

## 2012-11-22 ENCOUNTER — Ambulatory Visit (HOSPITAL_COMMUNITY): Payer: Medicare Other | Admitting: *Deleted

## 2012-11-27 ENCOUNTER — Ambulatory Visit (HOSPITAL_COMMUNITY): Payer: Medicare Other | Admitting: *Deleted

## 2012-11-29 ENCOUNTER — Ambulatory Visit (HOSPITAL_COMMUNITY): Payer: Medicare Other | Admitting: Physical Therapy

## 2012-12-04 ENCOUNTER — Ambulatory Visit (HOSPITAL_COMMUNITY): Payer: Medicare Other | Admitting: Physical Therapy

## 2012-12-06 ENCOUNTER — Ambulatory Visit (HOSPITAL_COMMUNITY): Payer: Medicare Other | Admitting: Physical Therapy

## 2013-01-02 ENCOUNTER — Ambulatory Visit (INDEPENDENT_AMBULATORY_CARE_PROVIDER_SITE_OTHER): Payer: Medicare Other | Admitting: Urology

## 2013-01-02 DIAGNOSIS — N433 Hydrocele, unspecified: Secondary | ICD-10-CM

## 2013-02-01 ENCOUNTER — Ambulatory Visit (INDEPENDENT_AMBULATORY_CARE_PROVIDER_SITE_OTHER): Payer: Medicare Other | Admitting: Cardiology

## 2013-02-01 ENCOUNTER — Encounter: Payer: Self-pay | Admitting: Cardiology

## 2013-02-01 VITALS — BP 130/68 | HR 59 | Ht 73.0 in | Wt 194.0 lb

## 2013-02-01 DIAGNOSIS — I350 Nonrheumatic aortic (valve) stenosis: Secondary | ICD-10-CM

## 2013-02-01 DIAGNOSIS — R002 Palpitations: Secondary | ICD-10-CM

## 2013-02-01 DIAGNOSIS — I359 Nonrheumatic aortic valve disorder, unspecified: Secondary | ICD-10-CM

## 2013-02-01 DIAGNOSIS — I779 Disorder of arteries and arterioles, unspecified: Secondary | ICD-10-CM

## 2013-02-01 DIAGNOSIS — I493 Ventricular premature depolarization: Secondary | ICD-10-CM

## 2013-02-01 DIAGNOSIS — I4949 Other premature depolarization: Secondary | ICD-10-CM

## 2013-02-01 NOTE — Progress Notes (Signed)
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 At 72 years of age, he continues to do everything he likes to do. He is remarkably good shape. He denies any chest pain, dyspnea on exertion, presyncope or syncope. Carotid Dopplers were stable in June. Work is followed by Dr. Gerda Diss.  Past Medical History  Diagnosis Date  . Irregular heart rate   . Colon polyps   . Hypercholesteremia   . Arthritis     Current Outpatient Prescriptions  Medication Sig Dispense Refill  . aspirin 81 MG tablet Take 81 mg by mouth daily.        . metoprolol tartrate (LOPRESSOR) 25 MG tablet Take 1 tablet (25 mg total) by mouth 2 (two) times daily.  180 tablet  3  . Pitavastatin Calcium (LIVALO) 1 MG TABS Take 1 mg by mouth daily.         Current Facility-Administered Medications  Medication Dose Route Frequency Provider Last Rate Last Dose  . 0.9 %  sodium chloride infusion  500 mL Intravenous Continuous Iva Boop, MD        Allergies  Allergen Reactions  . Flexeril [Cyclobenzaprine Hcl]     unspecified  . Lodine [Etodolac]     unspecified  . Tetracyclines & Related     unspecified    Family History  Problem Relation Age of Onset  . Colon cancer Father 34  . Colon cancer Brother 86    History   Social History  . Marital Status: Married    Spouse Name: N/A    Number of Children: N/A  . Years of Education: N/A   Occupational History  . retired     08-2003   Social History Main Topics  . Smoking status: Former Smoker    Quit date: 06/18/1983  . Smokeless tobacco: Never Used     Comment: quit in 1985  . Alcohol Use: No  . Drug Use: No  . Sexual Activity: Not on file   Other Topics Concern  . Not on file   Social History Narrative  . No narrative on file    ROS ALL NEGATIVE EXCEPT THOSE NOTED IN HPI  PE  General Appearance: well developed,  well nourished in no acute distress HEENT: symmetrical face, PERRLA, good dentition  Neck: no JVD, thyromegaly, or adenopathy, trachea midline Chest: symmetric without deformity Cardiac: PMI non-displaced, RRR, normal S1, S2, no gallop, 2/6 systolic murmur consistent with mild aortic stenosis. S2 splits Lung: clear to ausculation and percussion Vascular: all pulses full without bruits  Abdominal: nondistended, nontender, good bowel sounds, no HSM, no bruits Extremities: no cyanosis, clubbing or edema, no sign of DVT, no varicosities  Skin: normal color, no rashes Neuro: alert and oriented x 3, non-focal Pysch: normal affect  EKG Sinus bradycardia, otherwise normal EKG BMET No results found for this basename: na, k, cl, co2, glucose, bun, creatinine, calcium, gfrnonaa, gfraa    Lipid Panel  No results found for this basename: chol, trig, hdl, cholhdl, vldl, ldlcalc    CBC No results found for this basename: wbc, rbc, hgb, hct, plt, mcv, mch, mchc, rdw, neutrabs, lymphsabs, monoabs, eosabs, basosabs

## 2013-02-01 NOTE — Assessment & Plan Note (Signed)
Stable in June. Continue secondary preventative therapy. Repeat in June of 2015

## 2013-02-01 NOTE — Assessment & Plan Note (Signed)
He is asymptomatic and exam is consistent with mild AS. His last echocardiogram was 2 years ago. We'll repeat we'll followup with Dr. Darl Householder in one year.

## 2013-02-01 NOTE — Patient Instructions (Addendum)
Your physician has requested that you have an echocardiogram.  Echocardiography is a painless test that uses sound waves to create images of your heart. It provides your doctor with information about the size and shape of your heart and how well your heart's chambers and valves are working. This procedure takes approximately one hour. There are no restrictions for this procedure.  Your physician recommends that you continue on your current medications as directed. Please refer to the Current Medication list given to you today.  Your physician wants you to follow-up in: 1 year with Dr. Purvis Sheffield in our [place] office. You will receive a reminder letter in the mail two months in advance. If you don't receive a letter, please call our office to schedule the follow-up appointment.  Your physician has requested that you have a carotid duplex June 2015 at [facility].  This test is an ultrasound of the carotid arteries in your neck. It looks at blood flow through these arteries that supply the brain with blood. Allow one hour for this exam. There are no restrictions or special instructions.

## 2013-03-01 ENCOUNTER — Other Ambulatory Visit: Payer: Self-pay | Admitting: Urology

## 2013-03-20 ENCOUNTER — Ambulatory Visit (HOSPITAL_COMMUNITY)
Admission: RE | Admit: 2013-03-20 | Discharge: 2013-03-20 | Disposition: A | Discharge status: Home / Self Care | Payer: Medicare Other | Source: Ambulatory Visit | Attending: Cardiology | Admitting: Cardiology

## 2013-03-20 DIAGNOSIS — I350 Nonrheumatic aortic (valve) stenosis: Secondary | ICD-10-CM

## 2013-03-20 DIAGNOSIS — R002 Palpitations: Secondary | ICD-10-CM | POA: Insufficient documentation

## 2013-03-20 DIAGNOSIS — Z87891 Personal history of nicotine dependence: Secondary | ICD-10-CM | POA: Insufficient documentation

## 2013-03-20 DIAGNOSIS — I359 Nonrheumatic aortic valve disorder, unspecified: Secondary | ICD-10-CM

## 2013-03-20 NOTE — Progress Notes (Signed)
[facility]   2D echo completed 03/20/2013.   Veda Canning, RDCS

## 2013-05-07 ENCOUNTER — Encounter (HOSPITAL_BASED_OUTPATIENT_CLINIC_OR_DEPARTMENT_OTHER): Payer: Self-pay | Admitting: *Deleted

## 2013-05-07 NOTE — Progress Notes (Signed)
05/07/13 1231  OBSTRUCTIVE SLEEP APNEA  Have you ever been diagnosed with sleep apnea through a sleep study? No  Do you snore loudly (loud enough to be heard through closed doors)?  1  Do you often feel tired, fatigued, or sleepy during the daytime? 0  Has anyone observed you stop breathing during your sleep? 1  Do you have, or are you being treated for high blood pressure? 0  BMI more than 35 kg/m2? 0  Age over 74 years old? 1  Neck circumference greater than 40 cm/18 inches? 0  Gender: 1  Obstructive Sleep Apnea Score 4  Score 4 or greater  Results sent to PCP

## 2013-05-07 NOTE — Progress Notes (Signed)
NPO AFTER MN. ARRIVE AT 0600. NEEDS ISTAT AND CXR.  CURRENT EKG IN EPIC AND CHART. WILL TAKE LOPRESSOR AM DOS W/ SIPS OF WATER.

## 2013-05-09 NOTE — Anesthesia Preprocedure Evaluation (Addendum)
Anesthesia Evaluation  Patient identified by MRN, date of birth, ID band Patient awake    Reviewed: Allergy & Precautions, H&P , NPO status , Patient's Chart, lab work & pertinent test results, reviewed documented beta blocker date and time   Airway Mallampati: II TM Distance: >3 FB Neck ROM: full    Dental  (+) Caps and Dental Advisory Given All front upper teeth are capped:   Pulmonary COPDformer smoker,  breath sounds clear to auscultation  Pulmonary exam normal       Cardiovascular hypertension, Pt. on home beta blockers + Valvular Problems/Murmurs AS Rhythm:regular Rate:Normal  Mild AS. palpitations   Neuro/Psych Carotid stenosis ( moderate ) negative neurological ROS  negative psych ROS   GI/Hepatic negative GI ROS, Neg liver ROS,   Endo/Other  negative endocrine ROS  Renal/GU negative Renal ROS  negative genitourinary   Musculoskeletal   Abdominal   Peds  Hematology negative hematology ROS (+)   Anesthesia Other Findings   Reproductive/Obstetrics negative OB ROS                          Anesthesia Physical Anesthesia Plan  ASA: III  Anesthesia Plan: General   Post-op Pain Management:    Induction: Intravenous  Airway Management Planned: LMA  Additional Equipment:   Intra-op Plan:   Post-operative Plan:   Informed Consent: I have reviewed the patients History and Physical, chart, labs and discussed the procedure including the risks, benefits and alternatives for the proposed anesthesia with the patient or authorized representative who has indicated his/her understanding and acceptance.   Dental Advisory Given  Plan Discussed with: CRNA and Surgeon  Anesthesia Plan Comments:         Anesthesia Quick Evaluation

## 2013-05-09 NOTE — H&P (Signed)
Urology History and Physical Exam  CC: Left sided scrotal swelling  HPI: 72 year old male presents for surgical management of a symptomatic, enlarging left hydrocele.  PMH: Past Medical History  Diagnosis Date  . Arthritis   . Hyperlipidemia   . History of colon polyps   . Symptomatic PVCs   . COPD (chronic obstructive pulmonary disease)   . Hypertension   . Left hydrocele   . Bilateral carotid artery stenosis     PER DUPLEX 11-17-2012---  RICA 1-39%/  LICA  60-79%  . Mild aortic valve stenosis   . Wears glasses     PSH: Past Surgical History  Procedure Laterality Date  . Stapedectomy Bilateral 1970  . Shoulder surgery Right 2004  . Transthoracic echocardiogram  03-20-2013  DR WALL    GRADE I DIASTOLIC DYSFUNCTION/  EF 65-70%/  MILD AORTIC STENOSIS WITH STABLE GRADIENTS  . Tonsillectomy  1962    Allergies: Allergies  Allergen Reactions  . Lodine [Etodolac] Other (See Comments)    URINARY RETENTION  . Flexeril [Cyclobenzaprine Hcl] Rash  . Tetracyclines & Related Rash    Medications: No prescriptions prior to admission     Social History: History   Social History  . Marital Status: Married    Spouse Name: N/A    Number of Children: N/A  . Years of Education: N/A   Occupational History  . retired     08-2003   Social History Main Topics  . Smoking status: Former Smoker -- 1.00 packs/day for 10 years    Types: Cigarettes    Quit date: 06/18/1983  . Smokeless tobacco: Never Used  . Alcohol Use: No  . Drug Use: No  . Sexual Activity: Not on file   Other Topics Concern  . Not on file   Social History Narrative  . No narrative on file    Family History: Family History  Problem Relation Age of Onset  . Colon cancer Father 43  . Colon cancer Brother 67    Review of Systems: Positive: Left scrotal enlargement, discomfort Negative:  A further 10 point review of systems was negative except what is listed in the HPI.  Physical  Exam: @VITALS2 @ Constitutional: Well nourished and well developed . No acute distress.  ENT:. The ears and nose are normal in appearance.  Neck: The appearance of the neck is normal and no neck mass is present.  Pulmonary: No respiratory distress and normal respiratory rhythm and effort.  Cardiovascular: Heart rate and rhythm are normal . No peripheral edema.  Abdomen: The abdomen is soft and nontender. No masses are palpated. No CVA tenderness. No hernias are palpable. No hepatosplenomegaly noted.  Rectal: Rectal exam demonstrates normal sphincter tone, the anus is normal on inspection., no tenderness and no masses. Estimated prostate size is 1+. Normal rectal tone, no rectal masses, prostate is smooth, symmetric and non-tender. The prostate has no nodularity and is not tender. The left seminal vesicle is nonpalpable. The right seminal vesicle is nonpalpable. The perineum is normal on inspection.  Genitourinary: Examination of the penis demonstrates a normal meatus. The penis is circumcised. Examination of the right scrotum demonstrates no hydrocele. Examination of the left scrotum demostrates a hydrocele. The right vas deferens is is palpably normal. The left vas deferens is not able to be palpated. The right epididymis is palpably normal. The right spermatic cord is palpably normal. The left spermatic cord is not palpable. The right testis is palpably normal. The left testis is nonpalpable.  Lymphatics: The femoral and inguinal nodes are not enlarged or tender.  Skin: Normal skin turgor, no visible rash and no visible skin lesions.  Neuro/Psych:. Mood and affect are appropriate  Studies:  No results found for this basename: HGB, WBC, PLT,  in the last 72 hours  No results found for this basename: NA, K, CL, CO2, BUN, CREATININE, CALCIUM, MAGNESIUM, GFRNONAA, GFRAA,  in the last 72 hours   No results found for this basename: PT, INR, APTT,  in the last 72 hours   No components found with this  basename: ABG,     Assessment:  Symptomatic left hydrocele  Plan: Left hydrocelectomy

## 2013-05-10 ENCOUNTER — Encounter (HOSPITAL_BASED_OUTPATIENT_CLINIC_OR_DEPARTMENT_OTHER): Payer: Medicare Other | Admitting: Anesthesiology

## 2013-05-10 ENCOUNTER — Ambulatory Visit (HOSPITAL_BASED_OUTPATIENT_CLINIC_OR_DEPARTMENT_OTHER)
Admission: RE | Admit: 2013-05-10 | Discharge: 2013-05-10 | Disposition: A | Discharge status: Home / Self Care | Payer: Medicare Other | Source: Ambulatory Visit | Attending: Urology | Admitting: Urology

## 2013-05-10 ENCOUNTER — Ambulatory Visit (HOSPITAL_COMMUNITY): Payer: Medicare Other

## 2013-05-10 ENCOUNTER — Ambulatory Visit (HOSPITAL_BASED_OUTPATIENT_CLINIC_OR_DEPARTMENT_OTHER): Payer: Medicare Other | Admitting: Anesthesiology

## 2013-05-10 ENCOUNTER — Encounter (HOSPITAL_BASED_OUTPATIENT_CLINIC_OR_DEPARTMENT_OTHER): Payer: Self-pay | Admitting: *Deleted

## 2013-05-10 ENCOUNTER — Encounter (HOSPITAL_BASED_OUTPATIENT_CLINIC_OR_DEPARTMENT_OTHER)
Admission: RE | Disposition: A | Discharge status: Home / Self Care | Payer: Self-pay | Source: Ambulatory Visit | Attending: Urology

## 2013-05-10 DIAGNOSIS — I10 Essential (primary) hypertension: Secondary | ICD-10-CM | POA: Insufficient documentation

## 2013-05-10 DIAGNOSIS — I658 Occlusion and stenosis of other precerebral arteries: Secondary | ICD-10-CM | POA: Insufficient documentation

## 2013-05-10 DIAGNOSIS — J4489 Other specified chronic obstructive pulmonary disease: Secondary | ICD-10-CM | POA: Insufficient documentation

## 2013-05-10 DIAGNOSIS — I359 Nonrheumatic aortic valve disorder, unspecified: Secondary | ICD-10-CM | POA: Insufficient documentation

## 2013-05-10 DIAGNOSIS — N433 Hydrocele, unspecified: Secondary | ICD-10-CM | POA: Insufficient documentation

## 2013-05-10 DIAGNOSIS — E785 Hyperlipidemia, unspecified: Secondary | ICD-10-CM | POA: Insufficient documentation

## 2013-05-10 DIAGNOSIS — Z87891 Personal history of nicotine dependence: Secondary | ICD-10-CM | POA: Insufficient documentation

## 2013-05-10 DIAGNOSIS — I6529 Occlusion and stenosis of unspecified carotid artery: Secondary | ICD-10-CM | POA: Insufficient documentation

## 2013-05-10 DIAGNOSIS — J449 Chronic obstructive pulmonary disease, unspecified: Secondary | ICD-10-CM | POA: Insufficient documentation

## 2013-05-10 HISTORY — PX: HYDROCELE EXCISION: SHX482

## 2013-05-10 HISTORY — DX: Essential (primary) hypertension: I10

## 2013-05-10 HISTORY — DX: Chronic obstructive pulmonary disease, unspecified: J44.9

## 2013-05-10 HISTORY — DX: Occlusion and stenosis of bilateral carotid arteries: I65.23

## 2013-05-10 HISTORY — DX: Presence of spectacles and contact lenses: Z97.3

## 2013-05-10 HISTORY — DX: Hydrocele, unspecified: N43.3

## 2013-05-10 HISTORY — DX: Hyperlipidemia, unspecified: E78.5

## 2013-05-10 HISTORY — DX: Ventricular premature depolarization: I49.3

## 2013-05-10 HISTORY — DX: Nonrheumatic aortic (valve) stenosis: I35.0

## 2013-05-10 HISTORY — DX: Personal history of colonic polyps: Z86.010

## 2013-05-10 HISTORY — DX: Personal history of colon polyps, unspecified: Z86.0100

## 2013-05-10 LAB — POCT I-STAT 4, (NA,K, GLUC, HGB,HCT)
Glucose, Bld: 100 mg/dL — ABNORMAL HIGH (ref 70–99)
Hemoglobin: 13.9 g/dL (ref 13.0–17.0)
Potassium: 4 mEq/L (ref 3.5–5.1)
Sodium: 143 mEq/L (ref 135–145)

## 2013-05-10 SURGERY — HYDROCELECTOMY
Anesthesia: General | Site: Scrotum | Laterality: Left

## 2013-05-10 MED ORDER — FENTANYL CITRATE 0.05 MG/ML IJ SOLN
25.0000 ug | INTRAMUSCULAR | Status: DC | PRN
  Filled 2013-05-10: qty 1

## 2013-05-10 MED ORDER — HYDROCODONE-ACETAMINOPHEN 5-325 MG PO TABS: 1.0000 | ORAL_TABLET | ORAL | Status: DC | PRN

## 2013-05-10 MED ORDER — MIDAZOLAM HCL 2 MG/2ML IJ SOLN
INTRAMUSCULAR | Status: AC
  Filled 2013-05-10: qty 2

## 2013-05-10 MED ORDER — LACTATED RINGERS IV SOLN
INTRAVENOUS | Status: DC
  Filled 2013-05-10: qty 1000

## 2013-05-10 MED ORDER — FENTANYL CITRATE 0.05 MG/ML IJ SOLN
INTRAMUSCULAR | Status: AC
  Filled 2013-05-10: qty 2

## 2013-05-10 MED ORDER — ONDANSETRON HCL 4 MG/2ML IJ SOLN
4.0000 mg | Freq: Four times a day (QID) | INTRAMUSCULAR | Status: DC | PRN
  Filled 2013-05-10: qty 2

## 2013-05-10 MED ORDER — FENTANYL CITRATE 0.05 MG/ML IJ SOLN
INTRAMUSCULAR | Status: DC | PRN
  Administered 2013-05-10 (×2): 50 ug via INTRAVENOUS

## 2013-05-10 MED ORDER — DEXAMETHASONE SODIUM PHOSPHATE 4 MG/ML IJ SOLN
INTRAMUSCULAR | Status: DC | PRN
  Administered 2013-05-10: 10 mg via INTRAVENOUS

## 2013-05-10 MED ORDER — EPHEDRINE SULFATE 50 MG/ML IJ SOLN
INTRAMUSCULAR | Status: DC | PRN
  Administered 2013-05-10 (×2): 10 mg via INTRAVENOUS

## 2013-05-10 MED ORDER — KETOROLAC TROMETHAMINE 30 MG/ML IJ SOLN
INTRAMUSCULAR | Status: DC | PRN
  Administered 2013-05-10: 15 mg via INTRAVENOUS

## 2013-05-10 MED ORDER — ONDANSETRON HCL 4 MG/2ML IJ SOLN
INTRAMUSCULAR | Status: DC | PRN
  Administered 2013-05-10: 4 mg via INTRAVENOUS

## 2013-05-10 MED ORDER — LIDOCAINE HCL (CARDIAC) 20 MG/ML IV SOLN
INTRAVENOUS | Status: DC | PRN
  Administered 2013-05-10: 80 mg via INTRAVENOUS

## 2013-05-10 MED ORDER — SODIUM CHLORIDE 0.9 % IV SOLN
250.0000 mL | INTRAVENOUS | Status: DC | PRN
Start: 2013-05-10 — End: 2013-05-10
  Filled 2013-05-10: qty 250

## 2013-05-10 MED ORDER — LACTATED RINGERS IV SOLN
INTRAVENOUS | Status: DC
  Administered 2013-05-10: 07:00:00 via INTRAVENOUS
  Filled 2013-05-10: qty 1000

## 2013-05-10 MED ORDER — ACETAMINOPHEN 650 MG RE SUPP
650.0000 mg | RECTAL | Status: DC | PRN
  Filled 2013-05-10: qty 1

## 2013-05-10 MED ORDER — PROPOFOL 10 MG/ML IV BOLUS
INTRAVENOUS | Status: DC | PRN
  Administered 2013-05-10: 200 mg via INTRAVENOUS

## 2013-05-10 MED ORDER — CEFAZOLIN SODIUM 1-5 GM-% IV SOLN
1.0000 g | INTRAVENOUS | Status: DC
  Filled 2013-05-10: qty 50

## 2013-05-10 MED ORDER — SODIUM CHLORIDE 0.9 % IJ SOLN
3.0000 mL | Freq: Two times a day (BID) | INTRAMUSCULAR | Status: DC
  Filled 2013-05-10: qty 3

## 2013-05-10 MED ORDER — ACETAMINOPHEN 325 MG PO TABS
650.0000 mg | ORAL_TABLET | ORAL | Status: DC | PRN
  Filled 2013-05-10: qty 2

## 2013-05-10 MED ORDER — OXYCODONE HCL 5 MG PO TABS
5.0000 mg | ORAL_TABLET | ORAL | Status: DC | PRN
  Filled 2013-05-10: qty 2

## 2013-05-10 MED ORDER — BUPIVACAINE HCL (PF) 0.25 % IJ SOLN
INTRAMUSCULAR | Status: DC | PRN
  Administered 2013-05-10: 10 mL

## 2013-05-10 MED ORDER — CEFAZOLIN SODIUM-DEXTROSE 2-3 GM-% IV SOLR
2.0000 g | INTRAVENOUS | Status: AC
  Administered 2013-05-10: 2 g via INTRAVENOUS
  Filled 2013-05-10: qty 50

## 2013-05-10 MED ORDER — SODIUM CHLORIDE 0.9 % IJ SOLN
3.0000 mL | INTRAMUSCULAR | Status: DC | PRN
  Filled 2013-05-10: qty 3

## 2013-05-10 MED ORDER — SODIUM CHLORIDE 0.9 % IR SOLN
Status: DC | PRN
  Administered 2013-05-10: 500 mL

## 2013-05-10 SURGICAL SUPPLY — 38 items
BANDAGE GAUZE ELAST BULKY 4 IN (GAUZE/BANDAGES/DRESSINGS) ×2 IMPLANT
BLADE SURG 15 STRL LF DISP TIS (BLADE) ×1 IMPLANT
BLADE SURG 15 STRL SS (BLADE) ×1
BLADE SURG ROTATE 9660 (MISCELLANEOUS) ×2 IMPLANT
CANISTER SUCTION 1200CC (MISCELLANEOUS) IMPLANT
CANISTER SUCTION 2500CC (MISCELLANEOUS) ×2 IMPLANT
CLEANER CAUTERY TIP 5X5 PAD (MISCELLANEOUS) IMPLANT
CLOTH BEACON ORANGE TIMEOUT ST (SAFETY) ×2 IMPLANT
COVER MAYO STAND STRL (DRAPES) ×2 IMPLANT
COVER TABLE BACK 60X90 (DRAPES) ×2 IMPLANT
DISSECTOR ROUND CHERRY 3/8 STR (MISCELLANEOUS) IMPLANT
DRAIN PENROSE 18X1/4 LTX STRL (WOUND CARE) ×2 IMPLANT
DRAPE PED LAPAROTOMY (DRAPES) ×2 IMPLANT
ELECT NEEDLE TIP 2.8 STRL (NEEDLE) IMPLANT
ELECT REM PT RETURN 9FT ADLT (ELECTROSURGICAL) ×2
ELECTRODE REM PT RTRN 9FT ADLT (ELECTROSURGICAL) ×1 IMPLANT
GAUZE SPONGE 4X4 12PLY STRL LF (GAUZE/BANDAGES/DRESSINGS) ×2 IMPLANT
GLOVE BIO SURGEON STRL SZ8 (GLOVE) ×2 IMPLANT
GOWN STRL REIN XL XLG (GOWN DISPOSABLE) ×4 IMPLANT
GOWN W/COTTON TOWEL STD LRG (GOWNS) IMPLANT
GOWN XL W/COTTON TOWEL STD (GOWNS) IMPLANT
NEEDLE HYPO 22GX1.5 SAFETY (NEEDLE) ×2 IMPLANT
NS IRRIG 500ML POUR BTL (IV SOLUTION) ×2 IMPLANT
PACK BASIN DAY SURGERY FS (CUSTOM PROCEDURE TRAY) ×2 IMPLANT
PAD CLEANER CAUTERY TIP 5X5 (MISCELLANEOUS)
PENCIL BUTTON HOLSTER BLD 10FT (ELECTRODE) ×2 IMPLANT
SUPPORT SCROTAL LG STRP (MISCELLANEOUS) IMPLANT
SUPPORT SCROTAL MED ADLT STRP (MISCELLANEOUS) ×2 IMPLANT
SUT CHROMIC 2 0 SH (SUTURE) ×2 IMPLANT
SUT CHROMIC 3 0 SH 27 (SUTURE) ×2 IMPLANT
SUT CHROMIC 4 0 SH 27 (SUTURE) IMPLANT
SUT MNCRL AB 4-0 PS2 18 (SUTURE) ×2 IMPLANT
SYR CONTROL 10ML LL (SYRINGE) ×2 IMPLANT
TOWEL OR 17X24 6PK STRL BLUE (TOWEL DISPOSABLE) ×4 IMPLANT
TRAY DSU PREP LF (CUSTOM PROCEDURE TRAY) ×2 IMPLANT
TUBE CONNECTING 12X1/4 (SUCTIONS) ×2 IMPLANT
WATER STERILE IRR 500ML POUR (IV SOLUTION) ×2 IMPLANT
YANKAUER SUCT BULB TIP NO VENT (SUCTIONS) ×2 IMPLANT

## 2013-05-10 NOTE — Anesthesia Procedure Notes (Signed)
Procedure Name: LMA Insertion Date/Time: 05/10/2013 7:40 AM Performed by: Maris Berger T Pre-anesthesia Checklist: Patient identified, Emergency Drugs available, Suction available and Patient being monitored Patient Re-evaluated:Patient Re-evaluated prior to inductionOxygen Delivery Method: Circle System Utilized Preoxygenation: Pre-oxygenation with 100% oxygen Intubation Type: IV induction Ventilation: Mask ventilation without difficulty LMA: LMA inserted LMA Size: 5.0 Number of attempts: 1 Airway Equipment and Method: bite block Placement Confirmation: positive ETCO2 Dental Injury: Teeth and Oropharynx as per pre-operative assessment

## 2013-05-10 NOTE — Transfer of Care (Signed)
Immediate Anesthesia Transfer of Care Note  Patient: Ronnie Nicholson  Procedure(s) Performed: Procedure(s): HYDROCELECTOMY ADULT (Left)  Patient Location: PACU  Anesthesia Type:General  Level of Consciousness: awake and oriented  Airway & Oxygen Therapy: Patient Spontanous Breathing and Patient connected to nasal cannula oxygen  Post-op Assessment: Report given to PACU RN  Post vital signs: Reviewed and stable  Complications: No apparent anesthesia complications

## 2013-05-10 NOTE — Interval H&P Note (Signed)
History and Physical Interval Note:  05/10/2013 7:33 AM  Ronnie Nicholson  has presented today for surgery, with the diagnosis of Left Hydrocele  The various methods of treatment have been discussed with the patient and family. After consideration of risks, benefits and other options for treatment, the patient has consented to  Procedure(s): HYDROCELECTOMY ADULT (Left) as a surgical intervention .  The patient's history has been reviewed, patient examined, no change in status, stable for surgery.  I have reviewed the patient's chart and labs.  Questions were answered to the patient's satisfaction.     Chelsea Aus

## 2013-05-10 NOTE — Anesthesia Postprocedure Evaluation (Signed)
  Anesthesia Post-op Note  Patient: Ronnie Nicholson  Procedure(s) Performed: Procedure(s) (LRB): HYDROCELECTOMY ADULT (Left)  Patient Location: PACU  Anesthesia Type: General  Level of Consciousness: awake and alert   Airway and Oxygen Therapy: Patient Spontanous Breathing  Post-op Pain: mild  Post-op Assessment: Post-op Vital signs reviewed, Patient's Cardiovascular Status Stable, Respiratory Function Stable, Patent Airway and No signs of Nausea or vomiting  Last Vitals:  Filed Vitals:   05/10/13 0830  BP: 121/69  Pulse: 65  Temp:   Resp: 12    Post-op Vital Signs: stable   Complications: No apparent anesthesia complications

## 2013-05-10 NOTE — Op Note (Signed)
Preoperative diagnosis: Left hydrocele   Postoperative diagnosis: Same   Procedure:Left hydrocele repair   Surgeon: Bertram Millard. Eldoris Beiser, M.D.   Anesthesia: Gen.   Indications: Patient presented with scrotal swelling. A hydrocele was confirmed with scrotal ultrasonography. The patient was symptomatic from his hydrocele and requested surgical intervention. He appeared to understand the risks benefits potential complications of this procedure.   Procedure: The patient was properly identified and marked in the holding room. He received preoperative IV antibiotics. He was then taken to the operating room where general anesthetic was administered using the LMA. The scrotum was then prepped and draped in the usual manner. Appropriate surgical timeout was performed. An incision was made in the median raphae of the scrotum. The hydrocele was encountered which was freed from the scrotal wall and then the testis and hydrocele sac were delivered from the incision. The hydrocele sac was then incised anteriorly and a large amount of fluid was obtained. The testis was carefully inspected and no other pathology was appreciated. The hydrocele sac was moderate in thickness.  The sac was then plicated posteriorly with a running 2-0 chromic suture. The spermatic cord block was then performed with quarter percent Marcaine. The testis was returned to the hemiscrotum taking great care to make sure that there was no twisting of the spermatic cord. Inspection of the inside of the scrotum revealed no significant bleeding. A quarter-inch Penrose was brought through the lower aspect of the scrotum into the affected hemiscrotum and sutured to the skin. The scrotal incision was then closed anatomically, first with a running 3-0 chromic reapproximating the dartos fascia, and then a 4-0 Monocryl used to close the skin in a simple running fashion. A fluff dressing and jockstrap was then placed Sponge and needle counts were correct. No  obvious complications occurred and the patient was brought to recovery room in stable condition.

## 2013-05-11 ENCOUNTER — Encounter (HOSPITAL_BASED_OUTPATIENT_CLINIC_OR_DEPARTMENT_OTHER): Payer: Self-pay | Admitting: Urology

## 2013-06-12 ENCOUNTER — Ambulatory Visit (INDEPENDENT_AMBULATORY_CARE_PROVIDER_SITE_OTHER): Payer: Self-pay | Admitting: Urology

## 2013-06-12 DIAGNOSIS — N433 Hydrocele, unspecified: Secondary | ICD-10-CM

## 2013-06-13 ENCOUNTER — Encounter: Payer: Self-pay | Admitting: Cardiovascular Disease

## 2013-06-13 ENCOUNTER — Ambulatory Visit (HOSPITAL_COMMUNITY): Payer: Medicare Other | Attending: Cardiovascular Disease

## 2013-06-13 DIAGNOSIS — I658 Occlusion and stenosis of other precerebral arteries: Secondary | ICD-10-CM | POA: Insufficient documentation

## 2013-06-13 DIAGNOSIS — Z87891 Personal history of nicotine dependence: Secondary | ICD-10-CM | POA: Insufficient documentation

## 2013-06-13 DIAGNOSIS — I6529 Occlusion and stenosis of unspecified carotid artery: Secondary | ICD-10-CM

## 2013-06-13 DIAGNOSIS — E785 Hyperlipidemia, unspecified: Secondary | ICD-10-CM | POA: Insufficient documentation

## 2013-07-09 ENCOUNTER — Encounter: Payer: Self-pay | Admitting: Family Medicine

## 2013-07-09 ENCOUNTER — Ambulatory Visit (INDEPENDENT_AMBULATORY_CARE_PROVIDER_SITE_OTHER): Payer: Medicare Other | Admitting: Family Medicine

## 2013-07-09 VITALS — BP 112/70 | Temp 99.2°F | Ht 73.0 in | Wt 195.4 lb

## 2013-07-09 DIAGNOSIS — J329 Chronic sinusitis, unspecified: Secondary | ICD-10-CM

## 2013-07-09 MED ORDER — AMOXICILLIN 500 MG PO CAPS: 500.0000 mg | ORAL_CAPSULE | Freq: Three times a day (TID) | ORAL | Status: DC

## 2013-07-09 NOTE — Progress Notes (Signed)
   Subjective:    Patient ID: Ronnie Nicholson, male    DOB: 26-Jul-1940, 73 y.o.   MRN: [id number]  Cough This is a new problem. The current episode started in the past 7 days. The problem has been unchanged. The problem occurs constantly. The cough is non-productive. Nothing aggravates the symptoms. He has tried OTC cough suppressant for the symptoms. The treatment provided no relief.   No fever  Pos prod cough  No known exp  Got the flu shot    Review of Systems  Respiratory: Positive for cough.    No vomiting no diarrhea no rash ROS otherwise    Objective:   Physical Exam  Alert mild malaise. H&T moderate his congestion and intermittent cough during exam. Pharynx light erythema neck supple. Lungs clear heart regular in rhythm.      Assessment & Plan:  Impression rhinosinusitis plan a mocks 500 3 times a day 10 days. Since Medicare discussed. Warning signs discussed. WSL

## 2013-07-19 ENCOUNTER — Other Ambulatory Visit: Payer: Self-pay | Admitting: Cardiology

## 2013-07-24 ENCOUNTER — Ambulatory Visit (INDEPENDENT_AMBULATORY_CARE_PROVIDER_SITE_OTHER): Payer: Self-pay | Admitting: Urology

## 2013-07-24 DIAGNOSIS — N4340 Spermatocele of epididymis, unspecified: Secondary | ICD-10-CM

## 2013-12-06 ENCOUNTER — Telehealth: Payer: Self-pay | Admitting: Family Medicine

## 2013-12-06 DIAGNOSIS — E785 Hyperlipidemia, unspecified: Secondary | ICD-10-CM

## 2013-12-06 DIAGNOSIS — I10 Essential (primary) hypertension: Secondary | ICD-10-CM

## 2013-12-06 DIAGNOSIS — Z125 Encounter for screening for malignant neoplasm of prostate: Secondary | ICD-10-CM

## 2013-12-06 DIAGNOSIS — Z79899 Other long term (current) drug therapy: Secondary | ICD-10-CM

## 2013-12-06 NOTE — Telephone Encounter (Signed)
Patient needs order for blood work. °

## 2013-12-06 NOTE — Telephone Encounter (Signed)
Patient was notified labwork was ordered.

## 2013-12-06 NOTE — Telephone Encounter (Signed)
Lip liv m7 psa 

## 2013-12-18 LAB — BASIC METABOLIC PANEL
BUN: 18 mg/dL (ref 6–23)
CALCIUM: 8.7 mg/dL (ref 8.4–10.5)
CO2: 29 meq/L (ref 19–32)
CREATININE: 0.91 mg/dL (ref 0.50–1.35)
Chloride: 106 mEq/L (ref 96–112)
Glucose, Bld: 86 mg/dL (ref 70–99)
Potassium: 4.3 mEq/L (ref 3.5–5.3)
Sodium: 142 mEq/L (ref 135–145)

## 2013-12-18 LAB — LIPID PANEL
Cholesterol: 256 mg/dL — ABNORMAL HIGH (ref 0–200)
HDL: 53 mg/dL (ref >39)
LDL CALC: 172 mg/dL — AB (ref 0–99)
TRIGLYCERIDES: 156 mg/dL — AB (ref <150)
Total CHOL/HDL Ratio: 4.8 Ratio
VLDL: 31 mg/dL (ref 0–40)

## 2013-12-18 LAB — HEPATIC FUNCTION PANEL
ALBUMIN: 4 g/dL (ref 3.5–5.2)
ALK PHOS: 65 U/L (ref 39–117)
ALT: 10 U/L (ref 0–53)
AST: 15 U/L (ref 0–37)
Bilirubin, Direct: 0.1 mg/dL (ref 0.0–0.3)
Indirect Bilirubin: 0.5 mg/dL (ref 0.2–1.2)
TOTAL PROTEIN: 6.4 g/dL (ref 6.0–8.3)
Total Bilirubin: 0.6 mg/dL (ref 0.2–1.2)

## 2013-12-19 LAB — PSA, MEDICARE: PSA: 0.31 ng/mL (ref <=4.00)

## 2014-01-07 ENCOUNTER — Ambulatory Visit (INDEPENDENT_AMBULATORY_CARE_PROVIDER_SITE_OTHER): Payer: Medicare Other | Admitting: Family Medicine

## 2014-01-07 ENCOUNTER — Encounter: Payer: Self-pay | Admitting: Family Medicine

## 2014-01-07 VITALS — BP 134/70 | HR 80 | Ht 71.5 in | Wt 191.8 lb

## 2014-01-07 DIAGNOSIS — Z23 Encounter for immunization: Secondary | ICD-10-CM

## 2014-01-07 MED ORDER — PRAVASTATIN SODIUM 20 MG PO TABS: 20.0000 mg | ORAL_TABLET | Freq: Every day | ORAL | Status: DC

## 2014-01-07 NOTE — Progress Notes (Signed)
Subjective:    Patient ID: Ronnie Nicholson, male    DOB: 1940-11-24, 73 y.o.   MRN: [id number]  HPI AWV- Annual Wellness Visit  The patient was seen for their annual wellness visit. The patient's past medical history, surgical history, and family history were reviewed. Pertinent vaccines were reviewed ( tetanus, pneumonia, shingles, flu) The patient's medication list was reviewed and updated.  The height and weight were entered. The patient's current BMI is: 26.38  Cognitive screening was completed. Outcome of Mini - Cog: Pass  Falls within the past 6 months: nonr  Current tobacco usage: nonr (All patients who use tobacco were given written and verbal information on quitting)  Recent listing of emergency department/hospitalizations over the past year were reviewed.  current specialist the patient sees on a regular basis: None   Medicare annual wellness visit patient questionnaire was reviewed.  A written screening schedule for the patient for the next 5-10 years was given. Appropriate discussion of followup regarding next visit was discussed.   Exercising regularly  Results for orders placed in visit on 12/06/13  LIPID PANEL      Result Value Ref Range   Cholesterol 256 (*) 0 - 200 mg/dL   Triglycerides 156 (*) <150 mg/dL   HDL 53  >39 mg/dL   Total CHOL/HDL Ratio 4.8     VLDL 31  0 - 40 mg/dL   LDL Cholesterol 172 (*) 0 - 99 mg/dL  HEPATIC FUNCTION PANEL      Result Value Ref Range   Total Bilirubin 0.6  0.2 - 1.2 mg/dL   Bilirubin, Direct 0.1  0.0 - 0.3 mg/dL   Indirect Bilirubin 0.5  0.2 - 1.2 mg/dL   Alkaline Phosphatase 65  39 - 117 U/L   AST 15  0 - 37 U/L   ALT 10  0 - 53 U/L   Total Protein 6.4  6.0 - 8.3 g/dL   Albumin 4.0  3.5 - 5.2 g/dL  BASIC METABOLIC PANEL      Result Value Ref Range   Sodium 142  135 - 145 mEq/L   Potassium 4.3  3.5 - 5.3 mEq/L   Chloride 106  96 - 112 mEq/L   CO2 29  19 - 32 mEq/L   Glucose, Bld 86  70 - 99 mg/dL   BUN 18   6 - 23 mg/dL   Creat 0.91  0.50 - 1.35 mg/dL   Calcium 8.7  8.4 - 10.5 mg/dL  PSA, MEDICARE      Result Value Ref Range   PSA 0.31  <=4.00 ng/mL    Colonoscopy due fall,   Watching diet but not as good, going to ball and watching grandson  Eating a kit of fast food  Took lavalo stopped because of high cost Review of Systems  Constitutional: Negative for fever, activity change and appetite change.  HENT: Negative for congestion and rhinorrhea.   Eyes: Negative for discharge.  Respiratory: Negative for cough and wheezing.   Cardiovascular: Negative for chest pain.  Gastrointestinal: Negative for vomiting, abdominal pain and blood in stool.  Genitourinary: Negative for frequency and difficulty urinating.  Musculoskeletal: Negative for neck pain.  Skin: Negative for rash.  Allergic/Immunologic: Negative for environmental allergies and food allergies.  Neurological: Negative for weakness and headaches.  Psychiatric/Behavioral: Negative for agitation.  All other systems reviewed and are negative.      Objective:   Physical Exam  Constitutional: He appears well-developed and well-nourished.  HENT:  Head:  Normocephalic and atraumatic.  Right Ear: External ear normal.  Left Ear: External ear normal.  Nose: Nose normal.  Mouth/Throat: Oropharynx is clear and moist.  Eyes: EOM are normal. Pupils are equal, round, and reactive to light.  Neck: Normal range of motion. Neck supple. No thyromegaly present.  Cardiovascular: Normal rate, regular rhythm and normal heart sounds.   No murmur heard. Pulmonary/Chest: Effort normal and breath sounds normal. No respiratory distress. He has no wheezes.  Abdominal: Soft. Bowel sounds are normal. He exhibits no distension and no mass. There is no tenderness.  Genitourinary: Penis normal.  Prostate gland within normal limits  Musculoskeletal: Normal range of motion. He exhibits no edema.  Lymphadenopathy:    He has no cervical adenopathy.    Neurological: He is alert. He exhibits normal muscle tone.  Skin: Skin is warm and dry. No erythema.  Psychiatric: He has a normal mood and affect. His behavior is normal. Judgment normal.          Assessment & Plan:  Impression 1 wellness exam #2 hyperlipidemia. Patient would like to try another low-dose agent. Generic. Plan Prevnar today. Colonoscopy this fall. Diet exercise discussed. Initiate Pravachol 20 mg daily. Check in 6 months. Lipid and liver then. WSL

## 2014-01-14 ENCOUNTER — Ambulatory Visit (INDEPENDENT_AMBULATORY_CARE_PROVIDER_SITE_OTHER): Payer: Medicare Other | Admitting: Cardiovascular Disease

## 2014-01-14 ENCOUNTER — Encounter: Payer: Self-pay | Admitting: Cardiovascular Disease

## 2014-01-14 VITALS — BP 140/82 | HR 70 | Ht 72.0 in | Wt 193.0 lb

## 2014-01-14 DIAGNOSIS — R002 Palpitations: Secondary | ICD-10-CM

## 2014-01-14 DIAGNOSIS — Z888 Allergy status to other drugs, medicaments and biological substances status: Secondary | ICD-10-CM

## 2014-01-14 DIAGNOSIS — Z789 Other specified health status: Secondary | ICD-10-CM

## 2014-01-14 DIAGNOSIS — I359 Nonrheumatic aortic valve disorder, unspecified: Secondary | ICD-10-CM

## 2014-01-14 DIAGNOSIS — I658 Occlusion and stenosis of other precerebral arteries: Secondary | ICD-10-CM

## 2014-01-14 DIAGNOSIS — I6529 Occlusion and stenosis of unspecified carotid artery: Secondary | ICD-10-CM

## 2014-01-14 DIAGNOSIS — I350 Nonrheumatic aortic (valve) stenosis: Secondary | ICD-10-CM

## 2014-01-14 DIAGNOSIS — I6523 Occlusion and stenosis of bilateral carotid arteries: Secondary | ICD-10-CM

## 2014-01-14 DIAGNOSIS — E785 Hyperlipidemia, unspecified: Secondary | ICD-10-CM

## 2014-01-14 NOTE — Patient Instructions (Signed)
Your physician recommends that you schedule a follow-up appointment in: 1 year with Dr Virgina Jock will receive a reminder letter two months in advance reminding you to call and schedule your appointment. If you don't receive this letter, please contact our office.  Your physician recommends that you continue on your current medications as directed. Please refer to the Current Medication list given to you today.  Your physician has requested that you have a carotid duplex. This test is an ultrasound of the carotid arteries in your neck. It looks at blood flow through these arteries that supply the brain with blood. Allow one hour for this exam. There are no restrictions or special instructions.

## 2014-01-14 NOTE — Progress Notes (Signed)
Patient ID: Ronnie Nicholson, male   DOB: February 17, 1941, 73 y.o.   MRN: [id number]      SUBJECTIVE: The patient is a 73 year old male with a reported history of symptomatic PVCs, carotid artery disease, and aortic stenosis. Echocardiogram performed on 03/20/2013 demonstrated normal left ventricular systolic function, EF 47-65%, grade 1 diastolic dysfunction, and mild aortic stenosis with a mean gradient of 13 mm mercury. Carotid Dopplers on 06/13/2013 demonstrated 1-39% right internal carotid artery stenosis and 60-79% left internal carotid artery stenosis.  He stays quite active and walks with his wife regularly. He denies exertional chest pain and dyspnea, as well as lightheadedness, dizziness and leg swelling. He says that metoprolol has completely resolved his palpitations. He admits to infrequently taking pravastatin due to muscle and joint aches. He has several questions with regards to statins in general.  Lipids on 12/18/2013 demonstrated total cholesterol 256, LDL 172, HDL 53, triglycerides 156.  ECG today demonstrates normal sinus rhythm with PVCs, heart rate 75 beats per minute.  Review of Systems: As per "subjective", otherwise negative.  Allergies  Allergen Reactions  . Lodine [Etodolac] Other (See Comments)    URINARY RETENTION  . Flexeril [Cyclobenzaprine Hcl] Rash  . Tetracyclines & Related Rash    Current Outpatient Prescriptions  Medication Sig Dispense Refill  . aspirin 81 MG tablet Take 81 mg by mouth daily.        . metoprolol tartrate (LOPRESSOR) 25 MG tablet TAKE ONE TABLET TWICE DAILY  180 tablet  1  . pravastatin (PRAVACHOL) 20 MG tablet Take 1 tablet (20 mg total) by mouth daily.  90 tablet  1   No current facility-administered medications for this visit.    Past Medical History  Diagnosis Date  . Arthritis   . Hyperlipidemia   . History of colon polyps   . Symptomatic PVCs   . COPD (chronic obstructive pulmonary disease)   . Hypertension   . Left  hydrocele   . Bilateral carotid artery stenosis     PER DUPLEX 11-17-2012---  RICA 4-65%/  LICA  03-54%  . Mild aortic valve stenosis   . Wears glasses     Past Surgical History  Procedure Laterality Date  . Stapedectomy Bilateral 1970  . Shoulder surgery Right 2004  . Transthoracic echocardiogram  03-20-2013  DR WALL    GRADE I DIASTOLIC DYSFUNCTION/  EF 65-70%/  MILD AORTIC STENOSIS WITH STABLE GRADIENTS  . Tonsillectomy  1962  . Hydrocele excision Left 05/10/2013    Procedure: HYDROCELECTOMY ADULT;  Surgeon: Franchot Gallo, MD;  Location: [facility];  Service: Urology;  Laterality: Left;    History   Social History  . Marital Status: Married    Spouse Name: N/A    Number of Children: N/A  . Years of Education: N/A   Occupational History  . retired     08-2003   Social History Main Topics  . Smoking status: Former Smoker -- 1.00 packs/day for 10 years    Types: Cigarettes    Quit date: 06/18/1983  . Smokeless tobacco: Never Used  . Alcohol Use: No  . Drug Use: No  . Sexual Activity: Not on file   Other Topics Concern  . Not on file   Social History Narrative  . No narrative on file     Filed Vitals:   01/14/14 0815  BP: 140/82  Pulse: 70  Height: 6' (1.829 m)  Weight: 193 lb (87.544 kg)  SpO2: 97%    PHYSICAL EXAM  General: NAD Neck: No JVD, no thyromegaly. Lungs: Clear to auscultation bilaterally with normal respiratory effort. CV: Nondisplaced PMI.  Regular rate and rhythm, normal S1/diminished S2, no S3/S4, III/VI ejection systolic murmur over RUSB. No pretibial or periankle edema.  Bilateral carotid bruits.  Normal pedal pulses.  Abdomen: Soft, nontender, no hepatosplenomegaly, no distention.  Neurologic: Alert and oriented x 3.  Psych: Normal affect. Extremities: No clubbing or cyanosis.   ECG: reviewed and available in electronic records.      ASSESSMENT AND PLAN: 1. Symptomatic PVC's: Symptomatically stable on  metoprolol 25 mg twice daily.  2. Aortic stenosis: Most recently assessed as mild in October 2014. This appears to be stable with no reported symptoms.  3. Carotid artery disease: Moderate blockage on the left (60-79%) in January 2015 with mild blockage on the right. I will reassess with carotid Dopplers.  4. Hyperlipidemia: We had a very long discussion with regards to the intended effects as well as side effects of statins, and the importance of taking them daily. If he is unable to take pravastatin 20 mg daily I would recommend decreasing the dose to 10 mg daily and if he were also unable to tolerate this, he may be eligible for the newer class of medications, PCSK-9 inhibitors.  Dispo: f/u 1 year.  Kate Sable, M.D., F.A.C.C.

## 2014-01-17 ENCOUNTER — Other Ambulatory Visit (HOSPITAL_COMMUNITY): Payer: Medicare Other

## 2014-01-30 ENCOUNTER — Other Ambulatory Visit: Payer: Self-pay | Admitting: Cardiovascular Disease

## 2014-03-02 ENCOUNTER — Encounter: Payer: Self-pay | Admitting: Gastroenterology

## 2014-05-20 ENCOUNTER — Telehealth: Payer: Self-pay | Admitting: Internal Medicine

## 2014-05-20 NOTE — Telephone Encounter (Signed)
Left message for patient to call back  

## 2014-05-21 ENCOUNTER — Ambulatory Visit (INDEPENDENT_AMBULATORY_CARE_PROVIDER_SITE_OTHER): Payer: Medicare Other | Admitting: Family Medicine

## 2014-05-21 ENCOUNTER — Encounter: Payer: Self-pay | Admitting: Family Medicine

## 2014-05-21 VITALS — BP 132/80 | Temp 98.4°F | Ht 73.0 in | Wt 193.0 lb

## 2014-05-21 DIAGNOSIS — I6523 Occlusion and stenosis of bilateral carotid arteries: Secondary | ICD-10-CM | POA: Diagnosis not present

## 2014-05-21 DIAGNOSIS — Z23 Encounter for immunization: Secondary | ICD-10-CM | POA: Diagnosis not present

## 2014-05-21 DIAGNOSIS — S61412A Laceration without foreign body of left hand, initial encounter: Secondary | ICD-10-CM

## 2014-05-21 DIAGNOSIS — R109 Unspecified abdominal pain: Secondary | ICD-10-CM | POA: Diagnosis not present

## 2014-05-21 NOTE — Progress Notes (Signed)
   Subjective:    Patient ID: Ronnie Nicholson, male    DOB: September 09, 1940, 73 y.o.   MRN: [id number]  Abdominal Pain This is a recurrent problem. The current episode started more than 1 month ago. The quality of the pain is aching. The pain is aggravated by eating. He has tried nothing for the symptoms.   Mid abd pain fairly severe, rad to bck  Hit Sunday after a meal  yest ate leftovers, not as bad of a spell  Had severe bout of similar pain severe mo ago, sudden onset, and sudden dparture, almost went to the er  No nause no vom  Still has gallbladder,  No fam hx of gb problems  No sig heartburn  Pt would like to get tetanus vaccine. He cut left hand yesterday.    Injured left hand. Puncture wound. Blood fair amount. Now is stop.  These 3 bouts of discomfort were quite severe. One time the patient was on the way to the emergency room. No family history of gall bladder disease  Review of Systems  Gastrointestinal: Positive for abdominal pain.   No headache no change in bowel habits no blood in stool no urinary symptoms ROS otherwise negative    Objective:   Physical Exam  Alert no apparent distress HEENT normal. Lungs clear heart regular in rhythm. Abdomen no discrete tenderness no rebound no guarding no masses      Assessment & Plan:  Impression intermittent colicky pain post large greasy meals radiating to back very severe nature. Gallbladder is highest on list. Discussed plan right upper quadrant ultrasound. 25 minutes spent most in discussion. Further recommendations based results. WSL

## 2014-05-22 NOTE — Telephone Encounter (Signed)
Left message for patient to call back  

## 2014-05-24 ENCOUNTER — Ambulatory Visit (HOSPITAL_COMMUNITY)
Admission: RE | Admit: 2014-05-24 | Discharge: 2014-05-24 | Disposition: A | Discharge status: Home / Self Care | Payer: Medicare Other | Source: Ambulatory Visit | Attending: Family Medicine | Admitting: Family Medicine

## 2014-05-24 ENCOUNTER — Emergency Department (HOSPITAL_COMMUNITY)
Admission: EM | Admit: 2014-05-24 | Discharge: 2014-05-24 | Disposition: A | Discharge status: Home / Self Care | Payer: Medicare Other | Attending: Emergency Medicine | Admitting: Emergency Medicine

## 2014-05-24 ENCOUNTER — Encounter (HOSPITAL_COMMUNITY): Payer: Self-pay | Admitting: *Deleted

## 2014-05-24 ENCOUNTER — Emergency Department (HOSPITAL_COMMUNITY): Payer: Medicare Other

## 2014-05-24 DIAGNOSIS — Z86010 Personal history of colonic polyps: Secondary | ICD-10-CM | POA: Insufficient documentation

## 2014-05-24 DIAGNOSIS — J449 Chronic obstructive pulmonary disease, unspecified: Secondary | ICD-10-CM | POA: Insufficient documentation

## 2014-05-24 DIAGNOSIS — I10 Essential (primary) hypertension: Secondary | ICD-10-CM | POA: Insufficient documentation

## 2014-05-24 DIAGNOSIS — K8020 Calculus of gallbladder without cholecystitis without obstruction: Secondary | ICD-10-CM

## 2014-05-24 DIAGNOSIS — Z7982 Long term (current) use of aspirin: Secondary | ICD-10-CM | POA: Insufficient documentation

## 2014-05-24 DIAGNOSIS — R109 Unspecified abdominal pain: Secondary | ICD-10-CM | POA: Diagnosis present

## 2014-05-24 DIAGNOSIS — K8050 Calculus of bile duct without cholangitis or cholecystitis without obstruction: Secondary | ICD-10-CM | POA: Insufficient documentation

## 2014-05-24 DIAGNOSIS — Z973 Presence of spectacles and contact lenses: Secondary | ICD-10-CM | POA: Diagnosis not present

## 2014-05-24 DIAGNOSIS — E785 Hyperlipidemia, unspecified: Secondary | ICD-10-CM | POA: Insufficient documentation

## 2014-05-24 DIAGNOSIS — Z79899 Other long term (current) drug therapy: Secondary | ICD-10-CM | POA: Insufficient documentation

## 2014-05-24 DIAGNOSIS — I493 Ventricular premature depolarization: Secondary | ICD-10-CM | POA: Diagnosis not present

## 2014-05-24 DIAGNOSIS — Z87891 Personal history of nicotine dependence: Secondary | ICD-10-CM | POA: Insufficient documentation

## 2014-05-24 LAB — CBC WITH DIFFERENTIAL/PLATELET
Basophils Absolute: 0 10*3/uL (ref 0.0–0.1)
Basophils Relative: 1 % (ref 0–1)
EOS PCT: 6 % — AB (ref 0–5)
Eosinophils Absolute: 0.4 10*3/uL (ref 0.0–0.7)
HEMATOCRIT: 38.4 % — AB (ref 39.0–52.0)
HEMOGLOBIN: 12.7 g/dL — AB (ref 13.0–17.0)
LYMPHS ABS: 1.7 10*3/uL (ref 0.7–4.0)
Lymphocytes Relative: 27 % (ref 12–46)
MCH: 30.4 pg (ref 26.0–34.0)
MCHC: 33.1 g/dL (ref 30.0–36.0)
MCV: 91.9 fL (ref 78.0–100.0)
MONOS PCT: 7 % (ref 3–12)
Monocytes Absolute: 0.5 10*3/uL (ref 0.1–1.0)
NEUTROS PCT: 59 % (ref 43–77)
Neutro Abs: 3.9 10*3/uL (ref 1.7–7.7)
Platelets: 207 10*3/uL (ref 150–400)
RBC: 4.18 MIL/uL — AB (ref 4.22–5.81)
RDW: 12.6 % (ref 11.5–15.5)
WBC: 6.5 10*3/uL (ref 4.0–10.5)

## 2014-05-24 LAB — LIPASE, BLOOD: Lipase: 20 U/L (ref 11–59)

## 2014-05-24 LAB — COMPREHENSIVE METABOLIC PANEL
ALK PHOS: 88 U/L (ref 39–117)
ALT: 40 U/L (ref 0–53)
AST: 96 U/L — AB (ref 0–37)
Albumin: 3.5 g/dL (ref 3.5–5.2)
Anion gap: 11 (ref 5–15)
BILIRUBIN TOTAL: 0.2 mg/dL — AB (ref 0.3–1.2)
BUN: 18 mg/dL (ref 6–23)
CHLORIDE: 101 meq/L (ref 96–112)
CO2: 28 mEq/L (ref 19–32)
Calcium: 8.7 mg/dL (ref 8.4–10.5)
Creatinine, Ser: 1.03 mg/dL (ref 0.50–1.35)
GFR calc Af Amer: 80 mL/min — ABNORMAL LOW (ref >90)
GFR calc non Af Amer: 69 mL/min — ABNORMAL LOW (ref >90)
Glucose, Bld: 124 mg/dL — ABNORMAL HIGH (ref 70–99)
POTASSIUM: 3.9 meq/L (ref 3.7–5.3)
Sodium: 140 mEq/L (ref 137–147)
TOTAL PROTEIN: 6.8 g/dL (ref 6.0–8.3)

## 2014-05-24 LAB — LACTIC ACID, PLASMA: LACTIC ACID, VENOUS: 1.3 mmol/L (ref 0.5–2.2)

## 2014-05-24 MED ORDER — HYDROCODONE-ACETAMINOPHEN 5-325 MG PO TABS: ORAL_TABLET | ORAL | Status: DC

## 2014-05-24 MED ORDER — IOHEXOL 300 MG/ML  SOLN
100.0000 mL | Freq: Once | INTRAMUSCULAR | Status: AC | PRN
  Administered 2014-05-24: 100 mL via INTRAVENOUS

## 2014-05-24 MED ORDER — IOHEXOL 300 MG/ML  SOLN
50.0000 mL | Freq: Once | INTRAMUSCULAR | Status: AC | PRN
  Administered 2014-05-24: 50 mL via ORAL

## 2014-05-24 NOTE — ED Notes (Signed)
Patient with no complaints at this time. Respirations even and unlabored. Skin warm/dry. Discharge instructions reviewed with patient at this time. Patient given opportunity to voice concerns/ask questions. Patient discharged at this time and left Emergency Department with steady gait.   

## 2014-05-24 NOTE — ED Notes (Signed)
Patient had onset of abdominal pain radiating to back after eating [place] food tonight.   Denies pain, n/v  at present.  Negative Murphy's sign. Had GB US today which showed possible chronic cholecystitis.  Is to f/u with Dr. Wolfgang Phoenix.

## 2014-05-24 NOTE — ED Provider Notes (Signed)
CSN: [id number]     Arrival date & time 05/24/14  1904 History   First MD Initiated Contact with Patient 05/24/14 1921     Chief Complaint  Patient presents with  . Abdominal Pain     HPI Pt was seen at 1925.  Per pt, c/o gradual onset and persistence of multiple intermittent episodes of upper abd "pain" for the past 4+ months, worse over the past week. Describes the abd pain as radiating into his back, and occurring after he eats. States he had "another episode" tonight PTA, "after I ate [place] food." Pt was evaluated by his PMD today, and was sent to the hospital for an outpatient RUQ [place]. Pt does not know the results. Denies vomiting/diarrhea, no fevers, no back pain, no rash, no CP/SOB, no black or blood in stools.       Past Medical History  Diagnosis Date  . Arthritis   . Hyperlipidemia   . History of colon polyps   . Symptomatic PVCs   . COPD (chronic obstructive pulmonary disease)   . Hypertension   . Left hydrocele   . Bilateral carotid artery stenosis     PER DUPLEX 11-17-2012---  RICA 3-81%/  LICA  01-75%  . Mild aortic valve stenosis   . Wears glasses    Past Surgical History  Procedure Laterality Date  . Stapedectomy Bilateral 1970  . Shoulder surgery Right 2004  . Transthoracic echocardiogram  03-20-2013  DR WALL    GRADE I DIASTOLIC DYSFUNCTION/  EF 65-70%/  MILD AORTIC STENOSIS WITH STABLE GRADIENTS  . Tonsillectomy  1962  . Hydrocele excision Left 05/10/2013    Procedure: HYDROCELECTOMY ADULT;  Surgeon: Franchot Gallo, MD;  Location: [facility];  Service: Urology;  Laterality: Left;   Family History  Problem Relation Age of Onset  . Colon cancer Father 68  . Colon cancer Brother 34   History  Substance Use Topics  . Smoking status: Former Smoker -- 1.00 packs/day for 10 years    Types: Cigarettes    Quit date: 06/18/1983  . Smokeless tobacco: Never Used  . Alcohol Use: No    Review of Systems ROS: Statement: All systems negative  except as marked or noted in the HPI; Constitutional: Negative for fever and chills. ; ; Eyes: Negative for eye pain, redness and discharge. ; ; ENMT: Negative for ear pain, hoarseness, nasal congestion, sinus pressure and sore throat. ; ; Cardiovascular: Negative for chest pain, palpitations, diaphoresis, dyspnea and peripheral edema. ; ; Respiratory: Negative for cough, wheezing and stridor. ; ; Gastrointestinal: +abd pain. Negative for nausea, vomiting, diarrhea, blood in stool, hematemesis, jaundice and rectal bleeding. . ; ; Genitourinary: Negative for dysuria, flank pain and hematuria. ; ; Musculoskeletal: Negative for back pain and neck pain. Negative for swelling and trauma.; ; Skin: Negative for pruritus, rash, abrasions, blisters, bruising and skin lesion.; ; Neuro: Negative for headache, lightheadedness and neck stiffness. Negative for weakness, altered level of consciousness , altered mental status, extremity weakness, paresthesias, involuntary movement, seizure and syncope.      Allergies  Lodine; Flexeril; and Tetracyclines & related  Home Medications   Prior to Admission medications   Medication Sig Start Date End Date Taking? Authorizing Provider  aspirin 81 MG tablet Take 81 mg by mouth daily.     Yes Historical Provider, MD  metoprolol tartrate (LOPRESSOR) 25 MG tablet TAKE ONE TABLET TWICE DAILY 01/31/14  Yes Herminio Commons, MD  pravastatin (PRAVACHOL) 20 MG tablet Take  1 tablet (20 mg total) by mouth daily. 01/07/14  Yes Mikey Kirschner, MD   BP 165/59 mmHg  Pulse 66  Temp(Src) 98 F (36.7 C) (Oral)  Resp 20  Ht 6\' 1"  (1.854 m)  Wt 192 lb (87.091 kg)  BMI 25.34 kg/m2  SpO2 100% Physical Exam  1930: Physical examination:  Nursing notes reviewed; Vital signs and O2 SAT reviewed;  Constitutional: Well developed, Well nourished, Well hydrated, In no acute distress; Head:  Normocephalic, atraumatic; Eyes: EOMI, PERRL, No scleral icterus; ENMT: Mouth and pharynx normal,  Mucous membranes moist; Neck: Supple, Full range of motion, No lymphadenopathy; Cardiovascular: Regular rate and rhythm, No gallop; Respiratory: Breath sounds clear & equal bilaterally, No rales, rhonchi, wheezes.  Speaking full sentences with ease, Normal respiratory effort/excursion; Chest: Nontender, Movement normal; Abdomen: Soft, Nontender, Nondistended, Normal bowel sounds; Genitourinary: No CVA tenderness; Extremities: Pulses normal, No tenderness, No edema, No calf edema or asymmetry.; Neuro: AA&Ox3, Major CN grossly intact.  Speech clear. No gross focal motor or sensory deficits in extremities.; Skin: Color normal, Warm, Dry.   ED Course  Procedures     EKG Interpretation None      MDM  MDM Reviewed: previous chart, nursing note and vitals Reviewed previous: ultrasound and labs Interpretation: labs and CT scan      US Abdomen Limited Ruq 05/24/2014   CLINICAL DATA:  One-month history of generalized abdominal pain ; history of hyperlipidemia and colonic polyps  EXAM: US ABDOMEN LIMITED - RIGHT UPPER QUADRANT  COMPARISON:  None.  FINDINGS: Gallbladder:  There is at least 1 large shadowing stone in the gallbladder fundus measuring 2.8 cm. Other echogenic foci in the gallbladder lumen are consistent with tiny stones or sludge. There is a focal area of gallbladder wall thickening measuring up to 1.2 cm in thickness. There is no positive sonographic Murphy's sign.  Common bile duct:  Diameter: 3 mm  Liver:  The liver exhibits no focal mass or ductal dilation. The maximal measured dimension is 16.4 cm.  IMPRESSION: Cholelithiasis associated with irregular gallbladder wall thickening. There is no positive sonographic Murphy's sign. Findings may reflect chronic cholecystitis. CT scanning of the abdomen is recommended to further evaluate the pericholecystic soft tissues and the gallbladder wall. Nuclear medicine hepatobiliary scanning may be of value in terms of assessing gallbladder function.    Electronically Signed   By: David  Martinique   On: 05/24/2014 09:43    Results for orders placed or performed during the hospital encounter of 05/24/14  Comprehensive metabolic panel  Result Value Ref Range   Sodium 140 137 - 147 mEq/L   Potassium 3.9 3.7 - 5.3 mEq/L   Chloride 101 96 - 112 mEq/L   CO2 28 19 - 32 mEq/L   Glucose, Bld 124 (H) 70 - 99 mg/dL   BUN 18 6 - 23 mg/dL   Creatinine, Ser 1.03 0.50 - 1.35 mg/dL   Calcium 8.7 8.4 - 10.5 mg/dL   Total Protein 6.8 6.0 - 8.3 g/dL   Albumin 3.5 3.5 - 5.2 g/dL   AST 96 (H) 0 - 37 U/L   ALT 40 0 - 53 U/L   Alkaline Phosphatase 88 39 - 117 U/L   Total Bilirubin 0.2 (L) 0.3 - 1.2 mg/dL   GFR calc non Af Amer 69 (L) >90 mL/min   GFR calc Af Amer 80 (L) >90 mL/min   Anion gap 11 5 - 15  Lipase, blood  Result Value Ref Range   Lipase 20  11 - 59 U/L  CBC with Differential  Result Value Ref Range   WBC 6.5 4.0 - 10.5 K/uL   RBC 4.18 (L) 4.22 - 5.81 MIL/uL   Hemoglobin 12.7 (L) 13.0 - 17.0 g/dL   HCT 38.4 (L) 39.0 - 52.0 %   MCV 91.9 78.0 - 100.0 fL   MCH 30.4 26.0 - 34.0 pg   MCHC 33.1 30.0 - 36.0 g/dL   RDW 12.6 11.5 - 15.5 %   Platelets 207 150 - 400 K/uL   Neutrophils Relative % 59 43 - 77 %   Neutro Abs 3.9 1.7 - 7.7 K/uL   Lymphocytes Relative 27 12 - 46 %   Lymphs Abs 1.7 0.7 - 4.0 K/uL   Monocytes Relative 7 3 - 12 %   Monocytes Absolute 0.5 0.1 - 1.0 K/uL   Eosinophils Relative 6 (H) 0 - 5 %   Eosinophils Absolute 0.4 0.0 - 0.7 K/uL   Basophils Relative 1 0 - 1 %   Basophils Absolute 0.0 0.0 - 0.1 K/uL  Lactic acid, plasma  Result Value Ref Range   Lactic Acid, Venous 1.3 0.5 - 2.2 mmol/L   Ct Abdomen Pelvis W Contrast 05/24/2014   CLINICAL DATA:  Right upper quadrant abdominal pain. Pain radiates to left flank pain  EXAM: CT ABDOMEN AND PELVIS WITH CONTRAST  TECHNIQUE: Multidetector CT imaging of the abdomen and pelvis was performed using the standard protocol following bolus administration of intravenous contrast.   CONTRAST:  36mL OMNIPAQUE IOHEXOL 300 MG/ML SOLN, 14mL OMNIPAQUE IOHEXOL 300 MG/ML SOLN  COMPARISON:  Ultrasound 05/14/2014  FINDINGS: Lower chest:  Lung bases are clear.  Hepatobiliary: No focal hepatic lesion. No biliary duct dilatation. There are 2 large lamellated gallstones within the lumen of the gallbladder measuring approximately 2.5 cm each. There is mild gallbladder wall thickening. No pericholecystic fluid evident. No biliary duct dilatation.  Pancreas: The pancreas is normal.  No duct dilatation.  Spleen: Normal spleen.  Adrenals/urinary tract: Adrenal glands normal. Kidneys are normal with parapelvic cysts. The ureters are normal. The bladder is normal.  Stomach/Bowel: Stomach, small bowel and cecum are normal. The cecum is in the right upper quadrant. Appendix is not identified however there is no pericecal inflammation to suggest acute appendicitis. Colon and rectosigmoid colon are normal.  Vascular/Lymphatic: Abdominal aorta is mildly calcified. No retroperitoneal periportal lymphadenopathy.  Reproductive: Prostate gland is normal. No pelvic lymphadenopathy. No free fluid the pelvis.  Musculoskeletal: No aggressive osseous lesion.  Other: No ventral abdominal hernia.  IMPRESSION: 1. Large gallstones within the gallbladder with mild wall thickening. No convincing evidence acute cholecystitis. 2. No bowel obstruction. 3. Atherosclerotic calcification aorta.   Electronically Signed   By: Suzy Bouchard M.D.   On: 05/24/2014 21:17    2120:  T/C to General Surgery Dr. Arnoldo Morale, case discussed, including:  HPI, pertinent PM/SHx, VS/PE, dx testing, ED course and treatment:  No acute surgical issue at this time, agreeable to f/u in office Tuesday at 1030am. Pt states he continues to feel "ok" and wants to go home now. Has tol PO well while in the ED without N/V. Abd remains benign, VSS. Dx and testing, as well as d/w General Surgeon, d/w pt and family.  Questions answered.  Verb understanding, agreeable  to d/c home with outpt f/u.      Francine Graven, DO 05/27/14 1428

## 2014-05-24 NOTE — Telephone Encounter (Signed)
I have left another message that if the patient still needs [place] to please call back

## 2014-05-24 NOTE — Discharge Instructions (Signed)
°Emergency Department Resource Guide °1) Find a Doctor and Pay Out of Pocket °Although you won't have to find out who is covered by your insurance plan, it is a good idea to ask around and get recommendations. You will then need to call the office and see if the doctor you have chosen will accept you as a new patient and what types of options they offer for patients who are self-pay. Some doctors offer discounts or will set up payment plans for their patients who do not have insurance, but you will need to ask so you aren't surprised when you get to your appointment. ° °2) Contact Your Local Health Department °Not all health departments have doctors that can see patients for sick visits, but many do, so it is worth a call to see if yours does. If you don't know where your local health department is, you can check in your phone book. The CDC also has a tool to help you locate your state's health department, and many state websites also have listings of all of their local health departments. ° °3) Find a Walk-in Clinic °If your illness is not likely to be very severe or complicated, you may want to try a walk in clinic. These are popping up all over the country in pharmacies, drugstores, and shopping centers. They're usually staffed by nurse practitioners or physician assistants that have been trained to treat common illnesses and complaints. They're usually fairly quick and inexpensive. However, if you have serious medical issues or chronic medical problems, these are probably not your best option. ° °No Primary Care Doctor: °- Call Health Connect at  [phone] - they can help you locate a primary care doctor that  accepts your insurance, provides certain services, etc. °- Physician Referral Service- 1-800-533-3463 ° °Chronic Pain Problems: °Organization         Address  Phone   Notes  °[facility]  ([phone]) [phone] Patients need to be referred by their primary care doctor.  ° °Medication  Assistance: °Organization         Address  Phone   Notes  °[place] [facility] [address] °[place], [place] [address] (336) 641-8030 --Must be a resident of [place] °-- Must have NO insurance coverage whatsoever (no Medicaid/ Medicare, etc.) °-- The pt. MUST have a primary care doctor that directs their care regularly and follows them in the community °  °[facility]  (866) 331-1348   °[employer]  (888) 892-1162   ° °Agencies that provide inexpensive medical care: °Organization         Address  Phone   Notes  °[facility]  (336) 832-8035   °[facility]    (336) 832-7272   °[facility] [address] °[place], [place] [address] (336) 832-4777   °[facility] [address], °[place] (336) 271-4999   °[facility]    (336) 373-0678   °[facility]    (336) 272-1050   °[facility] ° [address], [place] Phone:  (336) 832-4444, Fax:  (336) 832-4440 Hours of Operation:  9 am - 6 pm, M-F.  Also accepts Medicaid/Medicare and self-pay.  °[facility] ° [address],  Phone: (336) 832-3150, Fax: (336) [phone]. Hours of Operation:  8:30 am - 5:30 pm, M-F.  Also accepts Medicaid and self-pay.  °[facility] [place] [address], [place] Phone: (336) 878-6027   °[facility] [address], [place], [place] (336)723-1848, Ext. 123 Mondays & Thursdays: 7-9 AM.  First 15 patients are seen on a first come, first serve basis. °  ° °Medicaid-accepting [place] Providers: ° °Organization         Address  Phone   Notes  °[facility] [address], [place] (336) 641-2100 Also accepts self-pay patients.  °[facility] [address], [place] ° (336) 856-9996   °[facility] [address], [place]  (336) 288-8857   °[facility] [address] [place] [address], [place] (336) 299-7000   °Veita Bland [address], [place]  ° (336) 373-1557 Only accepts [place] Access Medicaid patients after they have their name applied to their card.  ° °Self-Pay (no insurance) in [place]: ° °Organization         Address  Phone   Notes  °Sickle Cell Patients, [facility] [address], [place] (336) 832-1970   °[facility] [address], [place] (336) 832-4400   °[facility] ° [address], [place] (336) 992-4800   °[facility]/Dr. Osei-Bonsu ° [address] [place] [address], [place] or [address], [place] (336) [phone] Phone number for both [place] and [place] locations is the same.  °[facility] [address], [place] (336) 299-0000   °[facility] [address] [place] [address], [place] or [address] (336) 852-7530 °(336) 878-2260   °[facility] [address], [place] (336) 350-1642, phone; (336) 294-5005, fax Sees patients 1st and 3rd Saturday of every month.  Must not qualify for public or private insurance (i.e. Medicaid, Medicare, [place] Health Choice, Veterans' Benefits) • Household income should be no more than 200% of the poverty level •The clinic cannot treat you if you are pregnant or think you are pregnant • Sexually transmitted diseases are not treated at the clinic.  ° ° °Dental Care: °Organization         Address  Phone  Notes  °[place] [facility] [address],  (336) 641-6152 Accepts children up to age 21 who are enrolled in Medicaid or [place] Health Choice; pregnant women with a Medicaid card; and children who have applied for Medicaid or [place] Health Choice, but were declined, whose parents can pay a reduced fee at time of service.  °[place]  [facility] [place]  [address], [place] (336) 641-7733 Accepts children up to age 21 who are enrolled in Medicaid or [place] Health Choice; pregnant women with a Medicaid card; and children who have applied for Medicaid or [place] Health Choice, but were declined, whose parents can pay a reduced fee at time of service.  °[facility] ° [address],  (336) 641-4533 Patients are seen by appointment only. Walk-ins are not accepted. [facility] will see patients 18 years of age and older. °Monday - Tuesday (8am-5pm) °Most Wednesdays (8:30-5pm) °$30 per visit, cash only  °[facility] ° [address], [place] (336) 641-4533 Patients are seen by appointment only. Walk-ins are not accepted. [facility] will see patients 18 years of age and older. °One   Wednesday Evening (Monthly: Volunteer Based).  $30 per visit, cash only  °[facility]  (919) 537-3737 for adults; Children under age 4, call Graduate Pediatric Dentistry at ([phone]) [phone]. Children aged 4-14, please call ([phone]) [phone] to request a pediatric application. ° Dental services are provided in all areas of dental care including fillings, crowns and bridges, complete and partial dentures, implants, gum treatment, root canals, and extractions. Preventive care is also provided. Treatment is provided to both adults and children. °Patients are selected via a lottery and there is often a waiting list. °  °[facility] [address], °[place] ° (336) 763-8833 [email or web address].drcivils.[email or web address] °  °[facility] [address], [place], [place] (336)723-1848, Ext. 123 Second and Fourth Thursday of each month, opens at 6:30 AM; Clinic ends at 9 AM.  Patients are seen on a first-come first-served basis, and a limited number are seen during each clinic.  ° °[facility] ° [address], [place], [place] (336) 723-7904    Eligibility Requirements °You must have lived in [place], [place], or [place] counties for at least the last three months. °  You cannot be eligible for state or federal sponsored healthcare insurance, including Veterans Administration, Medicaid, or Medicare. °  You generally cannot be eligible for healthcare insurance through your employer.  °  How to apply: °Eligibility screenings are held every Tuesday and Wednesday afternoon from 1:00 pm until 4:00 pm. You do not need an appointment for the interview!  °[facility] [address], [place], [place] 336-631-2330   °[facility]  336-342-8273   °[facility]  336-703-3100   °[facility]  336-570-6415   ° °Behavioral Health Resources in the Community: °Intensive Outpatient Programs °Organization         Address  Phone  Notes  °[facility] [address], [place], [place] 336-878-6098   °[facility] [address], [place], [place] 336-832-9800   °[facility] [address], [place], [place] ° 336-882-2125   °[facility] [address],  °[place], [place] 1-800-853-5163 or 336-641-4981   °Substance Abuse Resources °Organization         Address  Phone  Notes  °[facility]  336-882-2125   °[facility]  336-784-9470   °[facility]  336-285-9073   °[facility]  336-845-3988   °Residential & Outpatient Substance Abuse Program  1-800-659-3381   °Psychological Services °Organization         Address  Phone  Notes  °[facility]  336- 832-9600   °[facility]  336- 378-7881   °[facility] [address], [place] 1-800-853-5163 or 336-641-4981   ° °Mobile Crisis Teams °Organization         Address  Phone  Notes  °[facility], Mobile Crisis Care Unit  1-877-626-1772   °Assertive °[facility] ° [address]  [place], [place] 336-834-9664   °Sharon DeEsch [address] °[place] 336-554-5454   ° °Self-Help/Support Groups °Organization         Address  Phone             Notes  °[facility]  - variety of support groups  336- 373-1402 Call for more information  °Narcotics Anonymous (NA), [facility] [address], °[place]  2 meetings at this location  ° °  Residential Treatment Programs Organization         Address  Phone  Notes  [facility] [address],    [place]  1-(779)131-0778   [facility]  [address], [place] [address], [place], [place]   [place], [place] 848-819-2653 Admissions: 8am-3pm M-F  [facility] [address]    [place], [place] 858-850-2774   [facility] [address] [place], [place], [place]   [facility] [address]  [place], [place]   [facility] Intensive Outpatient [address], [place], [place]   [facility] ([place] [place]  [place], [place] 1-(317)858-7053 or 717-251-4665   [facility] [address], [place], [place] Accepts Medicaid  [facility] [address],  [place] 1-(205)621-6707 Substance Abuse/Addiction Treatment   [facility]         Address  Phone  Notes  [facility]  939-656-6582   Domenic Schwab, PhD [address] [place] [place], [place]   3673010450 or 475-838-1666   [place], [place] 416-732-6114   [facility] [address] [place], [place] 418-831-8567 Insurance/Medicaid/sponsorship through [facility] [address] [place] [address]                                    [place], [place] ([phone])504-2334 [place] [address], [place] ([phone]    Dr. Adele Schilder  [phone]   [facility] Dept. 1) [address], [place] 2) [place] 3)  [address] [place] [address], [place] 704-224-0479 [phone])[phone]  ([phone])[phone]   [place] 619-631-4573 or (973)[phone] (After Hours)      Eat a bland diet, avoiding greasy, fatty, fried foods, as well as spicy and acidic foods or beverages.  Avoid eating within the hour or 2 before going to bed or laying down.  Also avoid teas, colas, coffee, chocolate, pepermint and spearment. Take the prescription as directed.  Call your regular medical doctor on Monday to schedule a follow up appointment in the next 3 days. Call the General Surgeon's office on Monday to confirm your follow up appointment for Tuesday at 1030am.  Return to the Emergency Department immediately if worsening.

## 2014-05-24 NOTE — ED Notes (Signed)
Pt c/o abdominal pain that radiates through to his back.. Pt states he was here earlier today to have an [place] of his abdomen but won't get the results until Monday.

## 2014-05-27 ENCOUNTER — Emergency Department (HOSPITAL_COMMUNITY)
Admission: EM | Admit: 2014-05-27 | Discharge: 2014-05-27 | Disposition: A | Discharge status: Home / Self Care | Payer: Medicare Other | Attending: Emergency Medicine | Admitting: Emergency Medicine

## 2014-05-27 ENCOUNTER — Encounter (HOSPITAL_COMMUNITY): Payer: Self-pay | Admitting: *Deleted

## 2014-05-27 DIAGNOSIS — Z86010 Personal history of colonic polyps: Secondary | ICD-10-CM | POA: Diagnosis not present

## 2014-05-27 DIAGNOSIS — Z87891 Personal history of nicotine dependence: Secondary | ICD-10-CM | POA: Insufficient documentation

## 2014-05-27 DIAGNOSIS — M1990 Unspecified osteoarthritis, unspecified site: Secondary | ICD-10-CM | POA: Insufficient documentation

## 2014-05-27 DIAGNOSIS — E785 Hyperlipidemia, unspecified: Secondary | ICD-10-CM | POA: Diagnosis not present

## 2014-05-27 DIAGNOSIS — Z79899 Other long term (current) drug therapy: Secondary | ICD-10-CM | POA: Insufficient documentation

## 2014-05-27 DIAGNOSIS — Z7982 Long term (current) use of aspirin: Secondary | ICD-10-CM | POA: Insufficient documentation

## 2014-05-27 DIAGNOSIS — I10 Essential (primary) hypertension: Secondary | ICD-10-CM | POA: Diagnosis not present

## 2014-05-27 DIAGNOSIS — K8050 Calculus of bile duct without cholangitis or cholecystitis without obstruction: Secondary | ICD-10-CM | POA: Diagnosis not present

## 2014-05-27 DIAGNOSIS — R1084 Generalized abdominal pain: Secondary | ICD-10-CM | POA: Diagnosis present

## 2014-05-27 DIAGNOSIS — J449 Chronic obstructive pulmonary disease, unspecified: Secondary | ICD-10-CM | POA: Diagnosis not present

## 2014-05-27 LAB — URINALYSIS, ROUTINE W REFLEX MICROSCOPIC
Bilirubin Urine: NEGATIVE
Glucose, UA: NEGATIVE mg/dL
Hgb urine dipstick: NEGATIVE
Ketones, ur: NEGATIVE mg/dL
LEUKOCYTES UA: NEGATIVE
NITRITE: NEGATIVE
PH: 7 (ref 5.0–8.0)
Protein, ur: NEGATIVE mg/dL
SPECIFIC GRAVITY, URINE: 1.008 (ref 1.005–1.030)
UROBILINOGEN UA: 0.2 mg/dL (ref 0.0–1.0)

## 2014-05-27 LAB — CBC WITH DIFFERENTIAL/PLATELET
BASOS ABS: 0 10*3/uL (ref 0.0–0.1)
Basophils Relative: 1 % (ref 0–1)
EOS ABS: 0.2 10*3/uL (ref 0.0–0.7)
EOS PCT: 2 % (ref 0–5)
HEMATOCRIT: 43.3 % (ref 39.0–52.0)
HEMOGLOBIN: 14.4 g/dL (ref 13.0–17.0)
Lymphocytes Relative: 16 % (ref 12–46)
Lymphs Abs: 1.1 10*3/uL (ref 0.7–4.0)
MCH: 30.5 pg (ref 26.0–34.0)
MCHC: 33.3 g/dL (ref 30.0–36.0)
MCV: 91.7 fL (ref 78.0–100.0)
MONO ABS: 0.5 10*3/uL (ref 0.1–1.0)
MONOS PCT: 7 % (ref 3–12)
NEUTROS ABS: 4.9 10*3/uL (ref 1.7–7.7)
Neutrophils Relative %: 74 % (ref 43–77)
Platelets: 223 10*3/uL (ref 150–400)
RBC: 4.72 MIL/uL (ref 4.22–5.81)
RDW: 12.5 % (ref 11.5–15.5)
WBC: 6.7 10*3/uL (ref 4.0–10.5)

## 2014-05-27 LAB — COMPREHENSIVE METABOLIC PANEL
ALBUMIN: 4.2 g/dL (ref 3.5–5.2)
ALT: 27 U/L (ref 0–53)
ANION GAP: 15 (ref 5–15)
AST: 22 U/L (ref 0–37)
Alkaline Phosphatase: 100 U/L (ref 39–117)
BILIRUBIN TOTAL: 0.6 mg/dL (ref 0.3–1.2)
BUN: 17 mg/dL (ref 6–23)
CALCIUM: 9.7 mg/dL (ref 8.4–10.5)
CHLORIDE: 98 meq/L (ref 96–112)
CO2: 26 mEq/L (ref 19–32)
Creatinine, Ser: 1.08 mg/dL (ref 0.50–1.35)
GFR calc Af Amer: 76 mL/min — ABNORMAL LOW (ref >90)
GFR calc non Af Amer: 65 mL/min — ABNORMAL LOW (ref >90)
Glucose, Bld: 95 mg/dL (ref 70–99)
Potassium: 4.4 mEq/L (ref 3.7–5.3)
Sodium: 139 mEq/L (ref 137–147)
TOTAL PROTEIN: 7.8 g/dL (ref 6.0–8.3)

## 2014-05-27 LAB — LIPASE, BLOOD: LIPASE: 19 U/L (ref 11–59)

## 2014-05-27 NOTE — ED Notes (Signed)
Pt was seen and treated at [facility] on 12/18, dx with gallstones. Was told to follow up with Dr Arnoldo Morale on Tuesday 12/22. Pt and family does not want Dr. Arnoldo Morale as Psychologist, sport and exercise. Pt went to his PCP this morning, who sent pt to ED. Pt denies pain at present. Has been on liquid diet. Ate small amount of oatmeal at 0645. Denies n/v/d.

## 2014-05-27 NOTE — Discharge Instructions (Signed)
Biliary Colic  °Biliary colic is a steady or irregular pain in the upper abdomen. It is usually under the right side of the rib cage. It happens when gallstones interfere with the normal flow of bile from the gallbladder. Bile is a liquid that helps to digest fats. Bile is made in the liver and stored in the gallbladder. When you eat a meal, bile passes from the gallbladder through the cystic duct and the common bile duct into the small intestine. There, it mixes with partially digested food. If a gallstone blocks either of these ducts, the normal flow of bile is blocked. The muscle cells in the bile duct contract forcefully to try to move the stone. This causes the pain of biliary colic.  °SYMPTOMS  °· A person with biliary colic usually complains of pain in the upper abdomen. This pain can be: °· In the center of the upper abdomen just below the breastbone. °· In the upper-right part of the abdomen, near the gallbladder and liver. °· Spread back toward the right shoulder blade. °· Nausea and vomiting. °· The pain usually occurs after eating. °· Biliary colic is usually triggered by the digestive system's demand for bile. The demand for bile is high after fatty meals. Symptoms can also occur when a person who has been fasting suddenly eats a very large meal. Most episodes of biliary colic pass after 1 to 5 hours. After the most intense pain passes, your abdomen may continue to ache mildly for about 24 hours. °DIAGNOSIS  °After you describe your symptoms, your caregiver will perform a physical exam. He or she will pay attention to the upper right portion of your belly (abdomen). This is the area of your liver and gallbladder. An ultrasound will help your caregiver look for gallstones. Specialized scans of the gallbladder may also be done. Blood tests may be done, especially if you have fever or if your pain persists. °PREVENTION  °Biliary colic can be prevented by controlling the risk factors for gallstones. Some of  these risk factors, such as heredity, increasing age, and pregnancy are a normal part of life. Obesity and a high-fat diet are risk factors you can change through a healthy lifestyle. Women going through menopause who take hormone replacement therapy (estrogen) are also more likely to develop biliary colic. °TREATMENT  °· Pain medication may be prescribed. °· You may be encouraged to eat a fat-free diet. °· If the first episode of biliary colic is severe, or episodes of colic keep retuning, surgery to remove the gallbladder (cholecystectomy) is usually recommended. This procedure can be done through small incisions using an instrument called a laparoscope. The procedure often requires a brief stay in the hospital. Some people can leave the hospital the same day. It is the most widely used treatment in people troubled by painful gallstones. It is effective and safe, with no complications in more than 90% of cases. °· If surgery cannot be done, medication that dissolves gallstones may be used. This medication is expensive and can take months or years to work. Only small stones will dissolve. °· Rarely, medication to dissolve gallstones is combined with a procedure called shock-wave lithotripsy. This procedure uses carefully aimed shock waves to break up gallstones. In many people treated with this procedure, gallstones form again within a few years. °PROGNOSIS  °If gallstones block your cystic duct or common bile duct, you are at risk for repeated episodes of biliary colic. There is also a 25% chance that you will develop   a gallbladder infection(acute cholecystitis), or some other complication of gallstones within 10 to 20 years. If you have surgery, schedule it at a time that is convenient for you and at a time when you are not sick. HOME CARE INSTRUCTIONS   Drink plenty of clear fluids.  Avoid fatty, greasy or fried foods, or any foods that make your pain worse.  Take medications as directed. SEEK MEDICAL  CARE IF:   You develop a fever over 100.5 F (38.1 C).  Your pain gets worse over time.  You develop nausea that prevents you from eating and drinking.  You develop vomiting. SEEK IMMEDIATE MEDICAL CARE IF:   You have continuous or severe belly (abdominal) pain which is not relieved with medications.  You develop nausea and vomiting which is not relieved with medications.  You have symptoms of biliary colic and you suddenly develop a fever and shaking chills. This may signal cholecystitis. Call your caregiver immediately.  You develop a yellow color to your skin or the white part of your eyes (jaundice). Document Released: 10/25/2005 Document Revised: 08/16/2011 Document Reviewed: 01/04/2008 [facility] Patient Information 2015 [place], [place]. This information is not intended to replace advice given to you by your health care provider. Make sure you discuss any questions you have with your health care provider. Cholelithiasis Cholelithiasis (also called gallstones) is a form of gallbladder disease in which gallstones form in your gallbladder. The gallbladder is an organ that stores bile made in the liver, which helps digest fats. Gallstones begin as small crystals and slowly grow into stones. Gallstone pain occurs when the gallbladder spasms and a gallstone is blocking the duct. Pain can also occur when a stone passes out of the duct.  RISK FACTORS  Being male.   Having multiple pregnancies. Health care providers sometimes advise removing diseased gallbladders before future pregnancies.   Being obese.  Eating a diet heavy in fried foods and fat.   Being older than 5 years and increasing age.   Prolonged use of medicines containing male hormones.   Having diabetes mellitus.   Rapidly losing weight.   Having a family history of gallstones (heredity).  SYMPTOMS  Nausea.   Vomiting.  Abdominal pain.   Yellowing of the skin (jaundice).   Sudden pain. It  may persist from several minutes to several hours.  Fever.   Tenderness to the touch. In some cases, when gallstones do not move into the bile duct, people have no pain or symptoms. These are called "silent" gallstones.  TREATMENT Silent gallstones do not need treatment. In severe cases, emergency surgery may be required. Options for treatment include:  Surgery to remove the gallbladder. This is the most common treatment.  Medicines. These do not always work and may take 6-12 months or more to work.  Shock wave treatment (extracorporeal biliary lithotripsy). In this treatment an ultrasound machine sends shock waves to the gallbladder to break gallstones into smaller pieces that can pass into the intestines or be dissolved by medicine. HOME CARE INSTRUCTIONS   Only take over-the-counter or prescription medicines for pain, discomfort, or fever as directed by your health care provider.   Follow a low-fat diet until seen again by your health care provider. Fat causes the gallbladder to contract, which can result in pain.   Follow up with your health care provider as directed. Attacks are almost always recurrent and surgery is usually required for permanent treatment.  SEEK IMMEDIATE MEDICAL CARE IF:   Your pain increases and is not  controlled by medicines.   You have a fever or persistent symptoms for more than 2-3 days.   You have a fever and your symptoms suddenly get worse.   You have persistent nausea and vomiting.  MAKE SURE YOU:   Understand these instructions.  Will watch your condition.  Will get help right away if you are not doing well or get worse. Document Released: 05/20/2005 Document Revised: 01/24/2013 Document Reviewed: 11/15/2012 [facility] Patient Information 2015 [place], [place]. This information is not intended to replace advice given to you by your health care provider. Make sure you discuss any questions you have with your health care provider.

## 2014-05-27 NOTE — ED Notes (Signed)
MD at bedside. Colin Rhein

## 2014-05-27 NOTE — Addendum Note (Signed)
Addended by: Dairl Ponder on: 05/27/2014 08:15 AM   Modules accepted: Orders

## 2014-05-27 NOTE — ED Notes (Signed)
Unable to tolerate solid food w/o having major pain.

## 2014-05-27 NOTE — ED Provider Notes (Signed)
CSN: [id number]     Arrival date & time 05/27/14  31 History   First MD Initiated Contact with Patient 05/27/14 1128     Chief Complaint  Patient presents with  . gallstones      (Consider location/radiation/quality/duration/timing/severity/associated sxs/prior Treatment) Patient is a 73 y.o. male presenting with abdominal pain.  Abdominal Pain Pain location:  Generalized Pain quality: aching and sharp   Pain radiates to:  Does not radiate Pain severity:  Moderate Onset quality:  Gradual Duration:  1 week Timing:  Constant Progression:  Unchanged Chronicity:  New Context comment:  Recently seen at [facility] for same with cholelithiasis without cholecystitis and set up for outpt referral with surgery Relieved by:  Nothing Worsened by:  Eating Associated symptoms: anorexia, nausea and vomiting   Associated symptoms: no constipation, no diarrhea and no fever     Past Medical History  Diagnosis Date  . Arthritis   . Hyperlipidemia   . History of colon polyps   . Symptomatic PVCs   . COPD (chronic obstructive pulmonary disease)   . Hypertension   . Left hydrocele   . Bilateral carotid artery stenosis     PER DUPLEX 11-17-2012---  RICA 2-44%/  LICA  01-02%  . Mild aortic valve stenosis   . Wears glasses    Past Surgical History  Procedure Laterality Date  . Stapedectomy Bilateral 1970  . Shoulder surgery Right 2004  . Transthoracic echocardiogram  03-20-2013  DR WALL    GRADE I DIASTOLIC DYSFUNCTION/  EF 65-70%/  MILD AORTIC STENOSIS WITH STABLE GRADIENTS  . Tonsillectomy  1962  . Hydrocele excision Left 05/10/2013    Procedure: HYDROCELECTOMY ADULT;  Surgeon: Franchot Gallo, MD;  Location: [facility];  Service: Urology;  Laterality: Left;   Family History  Problem Relation Age of Onset  . Colon cancer Father 27  . Colon cancer Brother 72   History  Substance Use Topics  . Smoking status: Former Smoker -- 1.00 packs/day for 10 years   Types: Cigarettes    Quit date: 06/18/1983  . Smokeless tobacco: Never Used  . Alcohol Use: No    Review of Systems  Constitutional: Negative for fever.  Gastrointestinal: Positive for nausea, vomiting, abdominal pain and anorexia. Negative for diarrhea and constipation.  All other systems reviewed and are negative.     Allergies  Lodine; Flexeril; and Tetracyclines & related  Home Medications   Prior to Admission medications   Medication Sig Start Date End Date Taking? Authorizing Provider  aspirin EC 81 MG tablet Take 81 mg by mouth daily.   Yes Historical Provider, MD  metoprolol tartrate (LOPRESSOR) 25 MG tablet Take 25 mg by mouth 2 (two) times daily.   Yes Historical Provider, MD  pravastatin (PRAVACHOL) 20 MG tablet Take 1 tablet (20 mg total) by mouth daily. 01/07/14  Yes Mikey Kirschner, MD  aspirin 81 MG tablet Take 81 mg by mouth daily.      Historical Provider, MD  HYDROcodone-acetaminophen (NORCO/VICODIN) 5-325 MG per tablet 1 or 2 tabs PO q12 hours prn pain Patient not taking: Reported on 05/27/2014 05/24/14   Francine Graven, DO  metoprolol tartrate (LOPRESSOR) 25 MG tablet TAKE ONE TABLET TWICE DAILY 01/31/14   Herminio Commons, MD   BP 143/59 mmHg  Pulse 65  Temp(Src) 97.5 F (36.4 C) (Oral)  Resp 16  SpO2 99% Physical Exam  Constitutional: He is oriented to person, place, and time. He appears well-developed and well-nourished.  HENT:  Head: Normocephalic and atraumatic.  Eyes: Conjunctivae and EOM are normal.  Neck: Normal range of motion. Neck supple.  Cardiovascular: Normal rate, regular rhythm and normal heart sounds.   Pulmonary/Chest: Effort normal and breath sounds normal. No respiratory distress.  Abdominal: He exhibits no distension. There is no tenderness. There is no rebound and no guarding.  Musculoskeletal: Normal range of motion.  Neurological: He is alert and oriented to person, place, and time.  Skin: Skin is warm and dry.  Vitals  reviewed.   ED Course  Procedures (including critical care time) Labs Review Labs Reviewed  CBC WITH DIFFERENTIAL  URINALYSIS, ROUTINE W REFLEX MICROSCOPIC  COMPREHENSIVE METABOLIC PANEL  LIPASE, BLOOD    Imaging Review No results found.   EKG Interpretation None      MDM   Final diagnoses:  Biliary colic    73 y.o. male with pertinent PMH of COPD, HTN, cholelithiasis presents with primary cc of wanting to have his gallbladder removed.  The patient presented 2 days ago to [facility] where he was diagnosed with cholelithiasis without evidence of cholecystitis. Outpatient follow-up with surgery was arranged, however the patient and his wife do not want to go to the surgeon that they were told to follow-up with. They present today primarily to have another surgeon perform the operation. Patient states his symptoms have been mostly resolved since time of last year visit. He states he's been able to eat applesauce and soft foods without any difficulty or pain. He has not had nausea or vomiting over the last 2 days. On my examination patient is well-appearing, has stable vitals, has no abdominal pain or tenderness.   I spoke with [facility] to help arrange for followup.  Unable to arrange this at this time, however they are aware of the pt and he was instructed to call when he left today.  He voiced understanding of standard return precautions.  I have reviewed all laboratory and imaging studies if ordered as above  1. Biliary colic         Debby Freiberg, MD 05/27/14 [phone]

## 2014-06-11 ENCOUNTER — Encounter: Payer: Self-pay | Admitting: Family Medicine

## 2014-06-11 ENCOUNTER — Ambulatory Visit (INDEPENDENT_AMBULATORY_CARE_PROVIDER_SITE_OTHER): Payer: Medicare Other | Admitting: Family Medicine

## 2014-06-11 VITALS — BP 122/86 | Temp 98.7°F | Ht 73.0 in | Wt 190.0 lb

## 2014-06-11 DIAGNOSIS — J310 Chronic rhinitis: Secondary | ICD-10-CM

## 2014-06-11 DIAGNOSIS — J329 Chronic sinusitis, unspecified: Secondary | ICD-10-CM

## 2014-06-11 MED ORDER — CEPHALEXIN 500 MG PO CAPS: 500.0000 mg | ORAL_CAPSULE | Freq: Three times a day (TID) | ORAL | Status: DC

## 2014-06-11 NOTE — Progress Notes (Signed)
   Subjective:    Patient ID: Ronnie Nicholson, male    DOB: 15-Apr-1941, 73 y.o.   MRN: [id number]  HPI Comments: Pt getting gallbladder surgery on the 13th.   Cough This is a new problem. The current episode started yesterday. Associated symptoms include nasal congestion and rhinorrhea. He has tried OTC cough suppressant for the symptoms.   Cough and cong and drainage  Some headache frontal, worse with change of position.  Cough is productive  Sore throat off-and-on.  Due to have gallbladder surgery shortly.   Review of Systems  HENT: Positive for rhinorrhea.   Respiratory: Positive for cough.    No vomiting no diarrhea no abdominal pain currently    Objective:   Physical Exam  Alert moderate malaise. HEENT moderate nasal congestion frontal tenderness. Neck supple. Lungs clear. Heart regular in rhythm.      Assessment & Plan:  Impression acute rhinosinusitis plan antibiotics prescribed. Since Medicare discussed. Warning signs discussed. WSL

## 2014-06-19 ENCOUNTER — Other Ambulatory Visit (INDEPENDENT_AMBULATORY_CARE_PROVIDER_SITE_OTHER): Payer: Self-pay | Admitting: Surgery

## 2014-06-19 ENCOUNTER — Encounter (HOSPITAL_BASED_OUTPATIENT_CLINIC_OR_DEPARTMENT_OTHER): Payer: Self-pay | Admitting: *Deleted

## 2014-06-19 NOTE — Progress Notes (Signed)
Will go to [facility] for bmet-ekg done 8/15-

## 2014-06-19 NOTE — Progress Notes (Signed)
   06/19/14 1606  OBSTRUCTIVE SLEEP APNEA  Have you ever been diagnosed with sleep apnea through a sleep study? No  Do you snore loudly (loud enough to be heard through closed doors)?  0  Do you often feel tired, fatigued, or sleepy during the daytime? 0  Has anyone observed you stop breathing during your sleep? 0  Do you have, or are you being treated for high blood pressure? 1  BMI more than 35 kg/m2? 0  Age over 76 years old? 1  Neck circumference greater than 40 cm/16 inches? 1  Gender: 1  Obstructive Sleep Apnea Score 4  Score 4 or greater  Results sent to PCP

## 2014-06-20 ENCOUNTER — Encounter (HOSPITAL_COMMUNITY)
Admission: RE | Admit: 2014-06-20 | Discharge: 2014-06-20 | Disposition: A | Discharge status: Home / Self Care | Payer: Medicare Other | Source: Ambulatory Visit | Attending: Surgery | Admitting: Surgery

## 2014-06-20 ENCOUNTER — Other Ambulatory Visit (HOSPITAL_COMMUNITY): Payer: Medicare Other

## 2014-06-20 DIAGNOSIS — K8020 Calculus of gallbladder without cholecystitis without obstruction: Secondary | ICD-10-CM | POA: Insufficient documentation

## 2014-06-20 DIAGNOSIS — Z01818 Encounter for other preprocedural examination: Secondary | ICD-10-CM | POA: Diagnosis not present

## 2014-06-20 LAB — BASIC METABOLIC PANEL
ANION GAP: 6 (ref 5–15)
BUN: 18 mg/dL (ref 6–23)
CALCIUM: 9.1 mg/dL (ref 8.4–10.5)
CO2: 30 mmol/L (ref 19–32)
CREATININE: 1.06 mg/dL (ref 0.50–1.35)
Chloride: 103 mEq/L (ref 96–112)
GFR, EST AFRICAN AMERICAN: 77 mL/min — AB (ref >90)
GFR, EST NON AFRICAN AMERICAN: 67 mL/min — AB (ref >90)
Glucose, Bld: 111 mg/dL — ABNORMAL HIGH (ref 70–99)
POTASSIUM: 4.3 mmol/L (ref 3.5–5.1)
SODIUM: 139 mmol/L (ref 135–145)

## 2014-06-23 NOTE — H&P (Signed)
Ronnie Nicholson 06/19/2014 10:48 AM Location: [facility] Patient #: [id number] DOB: Jun 05, 1941 Married / Language: English / Race: White Male  History of Present Illness (Ezequiel Macauley A. Ninfa Linden MD; 06/19/2014 11:21 AM) Patient words: Intial GB.  The patient is a 74 year old male who presents for evaluation of gall stones. This is a pleasant gentleman referred by Dr. Wolfgang Phoenix for symptom cholelithiasis. He has had several attacks of right upper quadrant abdominal pain after fatty meals. This started this past summer but is now coming much more freely. He has severe pain in the right upper quadrant after fatty meals. He has no nausea or vomiting. The pain does not refer anywhere else. It is sharp in nature. He is otherwise without complaints. He has been to the emergency department recently for an attack   Other Problems Festus Holts, LPN; 2/50/5397 67:34 AM) Cholelithiasis Hypercholesterolemia  Past Surgical History Festus Holts, LPN; 1/93/7902 40:97 AM) Colon Polyp Removal - Colonoscopy Colon Polyp Removal - Open Shoulder Surgery Right. Vasectomy  Diagnostic Studies History Festus Holts, LPN; 3/53/2992 42:68 AM) Colonoscopy 1-5 years ago  Allergies Festus Holts, LPN; 3/41/9622 29:79 AM) Lodine *ANALGESICS - ANTI-INFLAMMATORY* Flexeril *MUSCULOSKELETAL THERAPY AGENTS* Tetracycline *CHEMICALS*  Medication History Festus Holts, LPN; 8/92/1194 17:40 AM) Aspirin Low Strength (81MG  Tablet Chewable, Oral) Active. Lopressor (50MG  Tablet, Oral) Active.  Social History Festus Holts, LPN; 01/18/4817 56:31 AM) Alcohol use Occasional alcohol use. No caffeine use No drug use Tobacco use Former smoker.  Family History Festus Holts, LPN; 4/97/0263 78:58 AM) Breast Cancer Sister. Colon Cancer Brother. Colon Polyps Brother. Melanoma Brother.  Review of Systems Festus Holts LPN; 8/50/2774 12:87 AM) General Not Present- Appetite  Loss, Chills, Fatigue, Fever, Night Sweats, Weight Gain and Weight Loss. Skin Not Present- Change in Wart/Mole, Dryness, Hives, Jaundice, New Lesions, Non-Healing Wounds, Rash and Ulcer. HEENT Present- Hearing Loss. Not Present- Earache, Hoarseness, Nose Bleed, Oral Ulcers, Ringing in the Ears, Seasonal Allergies, Sinus Pain, Sore Throat, Visual Disturbances, Wears glasses/contact lenses and Yellow Eyes. Respiratory Not Present- Bloody sputum, Chronic Cough, Difficulty Breathing, Snoring and Wheezing. Breast Not Present- Breast Mass, Breast Pain, Nipple Discharge and Skin Changes. Cardiovascular Not Present- Chest Pain, Difficulty Breathing Lying Down, Leg Cramps, Palpitations, Rapid Heart Rate, Shortness of Breath and Swelling of Extremities. Gastrointestinal Not Present- Abdominal Pain, Bloating, Bloody Stool, Change in Bowel Habits, Chronic diarrhea, Constipation, Difficulty Swallowing, Excessive gas, Gets full quickly at meals, Hemorrhoids, Indigestion, Nausea, Rectal Pain and Vomiting. Male Genitourinary Not Present- Blood in Urine, Change in Urinary Stream, Frequency, Impotence, Nocturia, Painful Urination, Urgency and Urine Leakage. Musculoskeletal Not Present- Back Pain, Joint Pain, Joint Stiffness, Muscle Pain, Muscle Weakness and Swelling of Extremities. Neurological Not Present- Decreased Memory, Fainting, Headaches, Numbness, Seizures, Tingling, Tremor, Trouble walking and Weakness. Psychiatric Not Present- Anxiety, Bipolar, Change in Sleep Pattern, Depression, Fearful and Frequent crying. Endocrine Not Present- Cold Intolerance, Excessive Hunger, Hair Changes, Heat Intolerance, Hot flashes and New Diabetes. Hematology Not Present- Easy Bruising, Excessive bleeding, Gland problems, HIV and Persistent Infections.   Vitals Festus Holts LPN; 8/67/6720 94:70 AM) 06/19/2014 10:51 AM Weight: 186.13 lb Height: 73in Body Surface Area: 2.09 m Body Mass Index: 24.56 kg/m Temp.:  98.10F(Temporal)  Pulse: 64 (Regular)  Resp.: 18 (Unlabored)  BP: 140/84 (Sitting, Left Arm, Standard)    Physical Exam (Aeon Koors A. Ninfa Linden MD; 06/19/2014 11:21 AM) General Mental Status-Alert. General Appearance-Consistent with stated age. Hydration-Well hydrated. Voice-Normal.  Head and Neck Head-normocephalic, atraumatic with no lesions or palpable masses.  Eye Eyeball -  Bilateral-Extraocular movements intact. Sclera/Conjunctiva - Bilateral-No scleral icterus.  Chest and Lung Exam Chest and lung exam reveals -quiet, even and easy respiratory effort with no use of accessory muscles and on auscultation, normal breath sounds, no adventitious sounds and normal vocal resonance. Inspection Chest Wall - Normal. Back - normal.  Cardiovascular Cardiovascular examination reveals -on palpation PMI is normal in location and amplitude, no palpable S3 or S4. Normal cardiac borders., normal heart sounds, regular rate and rhythm with no murmurs, carotid auscultation reveals no bruits and normal pedal pulses bilaterally.  Abdomen Inspection Inspection of the abdomen reveals - No Hernias. Skin - Scar - no surgical scars. Palpation/Percussion Palpation and Percussion of the abdomen reveal - Soft, Non Tender, No Rebound tenderness, No Rigidity (guarding) and No hepatosplenomegaly. Auscultation Auscultation of the abdomen reveals - Bowel sounds normal.  Neurologic Neurologic evaluation reveals -alert and oriented x 3 with no impairment of recent or remote memory. Mental Status-Normal.  Musculoskeletal Normal Exam - Left-Upper Extremity Strength Normal and Lower Extremity Strength Normal. Normal Exam - Right-Upper Extremity Strength Normal, Lower Extremity Weakness.    Assessment & Plan (Macrae Wiegman A. Ninfa Linden MD; 06/19/2014 11:22 AM) SYMPTOMATIC CHOLELITHIASIS (574.20  K80.20) Impression: I have reviewed his ultrasound and CAT scan showing a thickened  gallbladder wall and gallstones. His liver function tests are normal. I recommend urgent laparoscopic cholecystectomy. I discussed this with the patient and his wife in detail and gave him literature regarding the surgery. I discussed the surgical risks which includes as not limited to bleeding, infection, bile duct injury, bile leak, injury to other structures, the need to convert to a open procedure, cardiopulmonary issues, DVT, etc. they understand and wish to proceed. I also discussed postoperative recovery.

## 2014-06-24 ENCOUNTER — Ambulatory Visit (HOSPITAL_BASED_OUTPATIENT_CLINIC_OR_DEPARTMENT_OTHER): Payer: Medicare Other | Admitting: Anesthesiology

## 2014-06-24 ENCOUNTER — Ambulatory Visit (HOSPITAL_BASED_OUTPATIENT_CLINIC_OR_DEPARTMENT_OTHER)
Admission: RE | Admit: 2014-06-24 | Discharge: 2014-06-24 | Disposition: A | Discharge status: Home / Self Care | Payer: Medicare Other | Source: Ambulatory Visit | Attending: Surgery | Admitting: Surgery

## 2014-06-24 ENCOUNTER — Encounter (HOSPITAL_BASED_OUTPATIENT_CLINIC_OR_DEPARTMENT_OTHER): Payer: Self-pay | Admitting: *Deleted

## 2014-06-24 ENCOUNTER — Encounter (HOSPITAL_BASED_OUTPATIENT_CLINIC_OR_DEPARTMENT_OTHER)
Admission: RE | Disposition: A | Discharge status: Home / Self Care | Payer: Self-pay | Source: Ambulatory Visit | Attending: Surgery

## 2014-06-24 DIAGNOSIS — K8010 Calculus of gallbladder with chronic cholecystitis without obstruction: Secondary | ICD-10-CM | POA: Insufficient documentation

## 2014-06-24 DIAGNOSIS — I10 Essential (primary) hypertension: Secondary | ICD-10-CM | POA: Insufficient documentation

## 2014-06-24 DIAGNOSIS — Z87891 Personal history of nicotine dependence: Secondary | ICD-10-CM | POA: Insufficient documentation

## 2014-06-24 DIAGNOSIS — E780 Pure hypercholesterolemia: Secondary | ICD-10-CM | POA: Insufficient documentation

## 2014-06-24 DIAGNOSIS — C801 Malignant (primary) neoplasm, unspecified: Secondary | ICD-10-CM

## 2014-06-24 HISTORY — PX: CHOLECYSTECTOMY: SHX55

## 2014-06-24 HISTORY — DX: Malignant (primary) neoplasm, unspecified: C80.1

## 2014-06-24 LAB — POCT HEMOGLOBIN-HEMACUE: HEMOGLOBIN: 13.9 g/dL (ref 13.0–17.0)

## 2014-06-24 SURGERY — LAPAROSCOPIC CHOLECYSTECTOMY
Anesthesia: General | Site: Abdomen

## 2014-06-24 MED ORDER — HYDROCODONE-ACETAMINOPHEN 5-325 MG PO TABS: 1.0000 | ORAL_TABLET | ORAL | Status: DC | PRN

## 2014-06-24 MED ORDER — MEPERIDINE HCL 25 MG/ML IJ SOLN: 6.2500 mg | INTRAMUSCULAR | Status: DC | PRN

## 2014-06-24 MED ORDER — MORPHINE SULFATE 2 MG/ML IJ SOLN: 1.0000 mg | INTRAMUSCULAR | Status: DC | PRN

## 2014-06-24 MED ORDER — ACETAMINOPHEN 650 MG RE SUPP: 650.0000 mg | RECTAL | Status: DC | PRN

## 2014-06-24 MED ORDER — OXYCODONE HCL 5 MG PO TABS: 5.0000 mg | ORAL_TABLET | ORAL | Status: DC | PRN

## 2014-06-24 MED ORDER — FENTANYL CITRATE 0.05 MG/ML IJ SOLN
INTRAMUSCULAR | Status: AC
  Filled 2014-06-24: qty 6

## 2014-06-24 MED ORDER — BUPIVACAINE-EPINEPHRINE (PF) 0.5% -1:200000 IJ SOLN
INTRAMUSCULAR | Status: AC
  Filled 2014-06-24: qty 30

## 2014-06-24 MED ORDER — FENTANYL CITRATE 0.05 MG/ML IJ SOLN: 50.0000 ug | INTRAMUSCULAR | Status: DC | PRN

## 2014-06-24 MED ORDER — ONDANSETRON HCL 4 MG/2ML IJ SOLN: 4.0000 mg | Freq: Once | INTRAMUSCULAR | Status: DC | PRN

## 2014-06-24 MED ORDER — PROPOFOL 10 MG/ML IV BOLUS
INTRAVENOUS | Status: AC
  Filled 2014-06-24: qty 20

## 2014-06-24 MED ORDER — ROCURONIUM BROMIDE 100 MG/10ML IV SOLN
INTRAVENOUS | Status: DC | PRN
  Administered 2014-06-24: 30 mg via INTRAVENOUS

## 2014-06-24 MED ORDER — PROPOFOL 10 MG/ML IV BOLUS
INTRAVENOUS | Status: DC | PRN
  Administered 2014-06-24: 150 mg via INTRAVENOUS

## 2014-06-24 MED ORDER — NEOSTIGMINE METHYLSULFATE 10 MG/10ML IV SOLN
INTRAVENOUS | Status: DC | PRN
  Administered 2014-06-24: 3 mg via INTRAVENOUS

## 2014-06-24 MED ORDER — SODIUM CHLORIDE 0.9 % IJ SOLN: 3.0000 mL | Freq: Two times a day (BID) | INTRAMUSCULAR | Status: DC

## 2014-06-24 MED ORDER — LIDOCAINE HCL (CARDIAC) 20 MG/ML IV SOLN
INTRAVENOUS | Status: DC | PRN
  Administered 2014-06-24: 100 mg via INTRAVENOUS

## 2014-06-24 MED ORDER — BUPIVACAINE-EPINEPHRINE (PF) 0.5% -1:200000 IJ SOLN
INTRAMUSCULAR | Status: DC | PRN
  Administered 2014-06-24: 20 mL

## 2014-06-24 MED ORDER — SODIUM CHLORIDE 0.9 % IR SOLN
Status: DC | PRN
  Administered 2014-06-24: 1

## 2014-06-24 MED ORDER — SODIUM CHLORIDE 0.9 % IJ SOLN: 3.0000 mL | INTRAMUSCULAR | Status: DC | PRN

## 2014-06-24 MED ORDER — SODIUM CHLORIDE 0.9 % IV SOLN: 250.0000 mL | INTRAVENOUS | Status: DC | PRN

## 2014-06-24 MED ORDER — EPHEDRINE SULFATE 50 MG/ML IJ SOLN
INTRAMUSCULAR | Status: DC | PRN
  Administered 2014-06-24: 10 mg via INTRAVENOUS

## 2014-06-24 MED ORDER — MIDAZOLAM HCL 2 MG/2ML IJ SOLN: 1.0000 mg | INTRAMUSCULAR | Status: DC | PRN

## 2014-06-24 MED ORDER — MIDAZOLAM HCL 2 MG/2ML IJ SOLN
INTRAMUSCULAR | Status: AC
  Filled 2014-06-24: qty 2

## 2014-06-24 MED ORDER — HYDROMORPHONE HCL 1 MG/ML IJ SOLN
0.2500 mg | INTRAMUSCULAR | Status: DC | PRN
  Administered 2014-06-24 (×2): 0.5 mg via INTRAVENOUS

## 2014-06-24 MED ORDER — CEFAZOLIN SODIUM-DEXTROSE 2-3 GM-% IV SOLR
2.0000 g | INTRAVENOUS | Status: AC
  Administered 2014-06-24: 2 g via INTRAVENOUS

## 2014-06-24 MED ORDER — LACTATED RINGERS IV SOLN
INTRAVENOUS | Status: DC
  Administered 2014-06-24 (×2): via INTRAVENOUS

## 2014-06-24 MED ORDER — HYDROMORPHONE HCL 1 MG/ML IJ SOLN
INTRAMUSCULAR | Status: AC
  Filled 2014-06-24: qty 1

## 2014-06-24 MED ORDER — FENTANYL CITRATE 0.05 MG/ML IJ SOLN
INTRAMUSCULAR | Status: DC | PRN
  Administered 2014-06-24: 100 ug via INTRAVENOUS
  Administered 2014-06-24: 50 ug via INTRAVENOUS

## 2014-06-24 MED ORDER — GLYCOPYRROLATE 0.2 MG/ML IJ SOLN
INTRAMUSCULAR | Status: DC | PRN
  Administered 2014-06-24: .4 mg via INTRAVENOUS
  Administered 2014-06-24: 0.2 mg via INTRAVENOUS

## 2014-06-24 MED ORDER — DEXAMETHASONE SODIUM PHOSPHATE 4 MG/ML IJ SOLN
INTRAMUSCULAR | Status: DC | PRN
  Administered 2014-06-24: 10 mg via INTRAVENOUS

## 2014-06-24 MED ORDER — MIDAZOLAM HCL 5 MG/5ML IJ SOLN
INTRAMUSCULAR | Status: DC | PRN
  Administered 2014-06-24: 2 mg via INTRAVENOUS

## 2014-06-24 MED ORDER — ACETAMINOPHEN 325 MG PO TABS: 650.0000 mg | ORAL_TABLET | ORAL | Status: DC | PRN

## 2014-06-24 MED ORDER — ONDANSETRON HCL 4 MG/2ML IJ SOLN
INTRAMUSCULAR | Status: DC | PRN
  Administered 2014-06-24: 4 mg via INTRAVENOUS

## 2014-06-24 SURGICAL SUPPLY — 42 items
APPLIER CLIP 5 13 M/L LIGAMAX5 (MISCELLANEOUS) ×4
BANDAGE ADH SHEER 1  50/CT (GAUZE/BANDAGES/DRESSINGS) IMPLANT
BENZOIN TINCTURE PRP APPL 2/3 (GAUZE/BANDAGES/DRESSINGS) ×4 IMPLANT
BLADE CLIPPER SURG (BLADE) ×4 IMPLANT
CANISTER SUCT 3000ML (MISCELLANEOUS) ×4 IMPLANT
CHLORAPREP W/TINT 26ML (MISCELLANEOUS) ×4 IMPLANT
CLIP APPLIE 5 13 M/L LIGAMAX5 (MISCELLANEOUS) ×2 IMPLANT
CLOSURE WOUND 1/2 X4 (GAUZE/BANDAGES/DRESSINGS) ×1
COVER MAYO STAND STRL (DRAPES) IMPLANT
DECANTER SPIKE VIAL GLASS SM (MISCELLANEOUS) IMPLANT
DRAPE C-ARM 42X72 X-RAY (DRAPES) IMPLANT
ELECT REM PT RETURN 9FT ADLT (ELECTROSURGICAL) ×4
ELECTRODE REM PT RTRN 9FT ADLT (ELECTROSURGICAL) ×2 IMPLANT
FILTER SMOKE EVAC LAPAROSHD (FILTER) IMPLANT
GLOVE BIOGEL PI IND STRL 7.0 (GLOVE) ×2 IMPLANT
GLOVE BIOGEL PI INDICATOR 7.0 (GLOVE) ×2
GLOVE ECLIPSE 6.5 STRL STRAW (GLOVE) ×4 IMPLANT
GLOVE EXAM NITRILE MD LF STRL (GLOVE) ×4 IMPLANT
GLOVE SURG SIGNA 7.5 PF LTX (GLOVE) ×4 IMPLANT
GOWN STRL REUS W/ TWL LRG LVL3 (GOWN DISPOSABLE) ×4 IMPLANT
GOWN STRL REUS W/ TWL XL LVL3 (GOWN DISPOSABLE) ×2 IMPLANT
GOWN STRL REUS W/TWL LRG LVL3 (GOWN DISPOSABLE) ×4
GOWN STRL REUS W/TWL XL LVL3 (GOWN DISPOSABLE) ×2
HEMOSTAT SNOW SURGICEL 2X4 (HEMOSTASIS) IMPLANT
PACK BASIN DAY SURGERY FS (CUSTOM PROCEDURE TRAY) ×4 IMPLANT
POUCH SPECIMEN RETRIEVAL 10MM (ENDOMECHANICALS) ×4 IMPLANT
SCISSORS LAP 5X35 DISP (ENDOMECHANICALS) IMPLANT
SET CHOLANGIOGRAPH 5 50 .035 (SET/KITS/TRAYS/PACK) IMPLANT
SET IRRIG TUBING LAPAROSCOPIC (IRRIGATION / IRRIGATOR) ×4 IMPLANT
SLEEVE ENDOPATH XCEL 5M (ENDOMECHANICALS) ×8 IMPLANT
SLEEVE SCD COMPRESS KNEE MED (MISCELLANEOUS) ×4 IMPLANT
SPECIMEN JAR SMALL (MISCELLANEOUS) ×4 IMPLANT
STRIP CLOSURE SKIN 1/2X4 (GAUZE/BANDAGES/DRESSINGS) ×3 IMPLANT
SUT MON AB 4-0 PC3 18 (SUTURE) ×4 IMPLANT
SUT VICRYL 0 UR6 27IN ABS (SUTURE) ×4 IMPLANT
TOWEL OR 17X24 6PK STRL BLUE (TOWEL DISPOSABLE) ×4 IMPLANT
TRAY LAPAROSCOPIC (CUSTOM PROCEDURE TRAY) ×4 IMPLANT
TROCAR XCEL BLUNT TIP 100MML (ENDOMECHANICALS) ×4 IMPLANT
TROCAR XCEL NON-BLD 5MMX100MML (ENDOMECHANICALS) ×4 IMPLANT
TUBE CONNECTING 20'X1/4 (TUBING) ×1
TUBE CONNECTING 20X1/4 (TUBING) ×3 IMPLANT
TUBING INSUFFLATION (TUBING) ×4 IMPLANT

## 2014-06-24 NOTE — Discharge Instructions (Signed)
[facility] ______[facility] LAPAROSCOPIC SURGERY: POST OP INSTRUCTIONS Always review your discharge instruction sheet given to you by the facility where your surgery was performed. IF YOU HAVE DISABILITY OR FAMILY LEAVE FORMS, YOU MUST BRING THEM TO THE OFFICE FOR PROCESSING.   DO NOT GIVE THEM TO YOUR DOCTOR.  1. A prescription for pain medication may be given to you upon discharge.  Take your pain medication as prescribed, if needed.  If narcotic pain medicine is not needed, then you may take acetaminophen (Tylenol) or ibuprofen (Advil) as needed. 2. Take your usually prescribed medications unless otherwise directed. 3. If you need a refill on your pain medication, please contact your pharmacy.  They will contact our office to request authorization. Prescriptions will not be filled after 5pm or on week-ends. 4. You should follow a light diet the first few days after arrival home, such as soup and crackers, etc.  Be sure to include lots of fluids daily. 5. Most patients will experience some swelling and bruising in the area of the incisions.  Ice packs will help.  Swelling and bruising can take several days to resolve.  6. It is common to experience some constipation if taking pain medication after surgery.  Increasing fluid intake and taking a stool softener (such as Colace) will usually help or prevent this problem from occurring.  A mild laxative (Milk of Magnesia or Miralax) should be taken according to package instructions if there are no bowel movements after 48 hours. 7. Unless discharge instructions indicate otherwise, you may remove your bandages 24-48 hours after surgery, and you may shower at that time.  You may have steri-strips (small skin tapes) in place directly over the incision.  These strips should be left on the skin for 7-10 days.  If your surgeon used skin glue on the incision, you may shower in 24 hours.  The glue will flake off over the next 2-3 weeks.  Any sutures or  staples will be removed at the office during your follow-up visit. 8. ACTIVITIES:  You may resume regular (light) daily activities beginning the next day--such as daily self-care, walking, climbing stairs--gradually increasing activities as tolerated.  You may have sexual intercourse when it is comfortable.  Refrain from any heavy lifting or straining until approved by your doctor. a. You may drive when you are no longer taking prescription pain medication, you can comfortably wear a seatbelt, and you can safely maneuver your car and apply brakes. b. RETURN TO WORK:  __________________________________________________________ 9. You should see your doctor in the office for a follow-up appointment approximately 2-3 weeks after your surgery.  Make sure that you call for this appointment within a day or two after you arrive home to insure a convenient appointment time. 10. OTHER INSTRUCTIONS: ___No lifting more than 15 pounds for 2 weeks. 11. _______________________________________________________________________________________________________________________ __________________________________________________________________________________________________________________________ WHEN TO CALL YOUR DOCTOR: 1. Fever over 101.0 2. Inability to urinate 3. Continued bleeding from incision. 4. Increased pain, redness, or drainage from the incision. 5. Increasing abdominal pain  The clinic staff is available to answer your questions during regular business hours.  Please dont hesitate to call and ask to speak to one of the nurses for clinical concerns.  If you have a medical emergency, go to the nearest emergency room or call 911.  A surgeon from [facility] is always on call at the hospital. [address], [place], [place], [place]  [address] ? [address], [place], [place]   [address] [phone] ? [phone] ? FAX [phone] Web site: [email or web address]   Post  Anesthesia Home Care Instructions  Activity: Get plenty of rest for the remainder of the day. A responsible adult should stay with you for 24 hours following the procedure.  For the next 24 hours, DO NOT: -Drive a car -Paediatric nurse -Drink alcoholic beverages -Take any medication unless instructed by your physician -Make any legal decisions or sign important papers.  Meals: Start with liquid foods such as gelatin or soup. Progress to regular foods as tolerated. Avoid greasy, spicy, heavy foods. If nausea and/or vomiting occur, drink only clear liquids until the nausea and/or vomiting subsides. Call your physician if vomiting continues.  Special Instructions/Symptoms: Your throat may feel dry or sore from the anesthesia or the breathing tube placed in your throat during surgery. If this causes discomfort, gargle with warm salt water. The discomfort should disappear within 24 hours.

## 2014-06-24 NOTE — Anesthesia Postprocedure Evaluation (Signed)
Anesthesia Post Note  Patient: Ronnie Nicholson  Procedure(s) Performed: Procedure(s) (LRB): LAPAROSCOPIC CHOLECYSTECTOMY (N/A)  Anesthesia type: general  Patient location: PACU  Post pain: Pain level controlled  Post assessment: Patient's Cardiovascular Status Stable  Last Vitals:  Filed Vitals:   06/24/14 1037  BP: 127/58  Pulse: 62  Temp: 36.5 C  Resp: 18    Post vital signs: Reviewed and stable  Level of consciousness: sedated  Complications: No apparent anesthesia complications

## 2014-06-24 NOTE — Anesthesia Preprocedure Evaluation (Addendum)
Anesthesia Evaluation  Patient identified by MRN, date of birth, ID band Patient awake    Reviewed: Allergy & Precautions, NPO status , Patient's Chart, lab work & pertinent test results  History of Anesthesia Complications Negative for: history of anesthetic complications  Airway Mallampati: I  TM Distance: >3 FB Neck ROM: Full    Dental   Pulmonary COPDformer smoker,          Cardiovascular hypertension, Pt. on medications and Pt. on home beta blockers + Valvular Problems/Murmurs AS  Mild AS by recent ECHO. Asymptomatic.   Neuro/Psych negative neurological ROS  negative psych ROS   GI/Hepatic   Endo/Other    Renal/GU      Musculoskeletal   Abdominal   Peds  Hematology   Anesthesia Other Findings   Reproductive/Obstetrics                            Anesthesia Physical Anesthesia Plan  ASA: II  Anesthesia Plan: General   Post-op Pain Management:    Induction: Intravenous  Airway Management Planned: Oral ETT  Additional Equipment:   Intra-op Plan:   Post-operative Plan:   Informed Consent: I have reviewed the patients History and Physical, chart, labs and discussed the procedure including the risks, benefits and alternatives for the proposed anesthesia with the patient or authorized representative who has indicated his/her understanding and acceptance.     Plan Discussed with: CRNA and Surgeon  Anesthesia Plan Comments:         Anesthesia Quick Evaluation

## 2014-06-24 NOTE — Transfer of Care (Signed)
Immediate Anesthesia Transfer of Care Note  Patient: Ronnie Nicholson  Procedure(s) Performed: Procedure(s): LAPAROSCOPIC CHOLECYSTECTOMY (N/A)  Patient Location: PACU  Anesthesia Type:General  Level of Consciousness: sedated  Airway & Oxygen Therapy: Patient Spontanous Breathing and Patient connected to face mask oxygen  Post-op Assessment: Report given to PACU RN and Post -op Vital signs reviewed and stable  Post vital signs: Reviewed and stable  Complications: No apparent anesthesia complications

## 2014-06-24 NOTE — Interval H&P Note (Signed)
History and Physical Interval Note: no change in H and P  06/24/2014 6:19 AM  Waucoma  has presented today for surgery, with the diagnosis of CHOLELITHIASIS  The various methods of treatment have been discussed with the patient and family. After consideration of risks, benefits and other options for treatment, the patient has consented to  Procedure(s): LAPAROSCOPIC CHOLECYSTECTOMY WITH INTRAOPERATIVE CHOLANGIOGRAM (N/A) as a surgical intervention .  The patient's history has been reviewed, patient examined, no change in status, stable for surgery.  I have reviewed the patient's chart and labs.  Questions were answered to the patient's satisfaction.     Liala Codispoti A

## 2014-06-24 NOTE — Op Note (Signed)
Laparoscopic Cholecystectomy Procedure Note  Indications: This patient presents with symptomatic gallbladder disease and will undergo laparoscopic cholecystectomy.  Pre-operative Diagnosis: Calculus of gallbladder without mention of cholecystitis or obstruction  Post-operative Diagnosis: Same  Surgeon: Coralie Keens A   Assistants: 0  Anesthesia: General endotracheal anesthesia  ASA Class: 2  Procedure Details  The patient was seen again in the Holding Room. The risks, benefits, complications, treatment options, and expected outcomes were discussed with the patient. The possibilities of reaction to medication, pulmonary aspiration, perforation of viscus, bleeding, recurrent infection, finding a normal gallbladder, the need for additional procedures, failure to diagnose a condition, the possible need to convert to an open procedure, and creating a complication requiring transfusion or operation were discussed with the patient. The likelihood of improving the patient's symptoms with return to their baseline status is good.  The patient and/or family concurred with the proposed plan, giving informed consent. The site of surgery properly noted. The patient was taken to Operating Room, identified as Ronnie Nicholson and the procedure verified as Laparoscopic Cholecystectomy with Intraoperative Cholangiogram. A Time Out was held and the above information confirmed.  Prior to the induction of general anesthesia, antibiotic prophylaxis was administered. General endotracheal anesthesia was then administered and tolerated well. After the induction, the abdomen was prepped with Chloraprep and draped in sterile fashion. The patient was positioned in the supine position.  Local anesthetic agent was injected into the skin near the umbilicus and an incision made. We dissected down to the abdominal fascia with blunt dissection.  The fascia was incised vertically and we entered the peritoneal cavity  bluntly.  A pursestring suture of 0-Vicryl was placed around the fascial opening.  The Hasson cannula was inserted and secured with the stay suture.  Pneumoperitoneum was then created with CO2 and tolerated well without any adverse changes in the patient's vital signs. An 5-mm port was placed in the subxiphoid position.  Two 5-mm ports were placed in the right upper quadrant. All skin incisions were infiltrated with a local anesthetic agent before making the incision and placing the trocars.   We positioned the patient in reverse Trendelenburg, tilted slightly to the patient's left.  The gallbladder was identified, the fundus grasped and retracted cephalad. Adhesions were lysed bluntly and with the electrocautery where indicated, taking care not to injure any adjacent organs or viscus. The infundibulum was grasped and retracted laterally, exposing the peritoneum overlying the triangle of Calot. This was then divided and exposed in a blunt fashion. The cystic duct was clearly identified and bluntly dissected circumferentially. A critical view of the cystic duct and cystic artery was obtained.  The cystic duct was then ligated with clips and divided. The cystic artery was, dissected free, ligated with clips and divided as well.   The gallbladder was dissected from the liver bed in retrograde fashion with the electrocautery. The gallbladder was removed and placed in an Endocatch sac. The liver bed was irrigated and inspected. Hemostasis was achieved with the electrocautery. Copious irrigation was utilized and was repeatedly aspirated until clear.  The gallbladder and Endocatch sac were then removed through the umbilical port site.  I had to make the incision in the skin and fascia much larger to pull the large stones out. The pursestring suture was used to close the umbilical fascia.  I then placed a second 0 vicryl suture at the fascia.  We again inspected the right upper quadrant for hemostasis.   Pneumoperitoneum was released as we removed the trocars.  4-0 Monocryl was used to close the skin.  Skin glue was applied. The patient was then extubated and brought to the recovery room in stable condition. Instrument, sponge, and needle counts were correct at closure and at the conclusion of the case.   Findings: Chronic Cholecystitis with Cholelithiasis  Estimated Blood Loss: Minimal         Drains: 0         Specimens: Gallbladder           Complications: None; patient tolerated the procedure well.         Disposition: PACU - hemodynamically stable.         Condition: stable

## 2014-06-24 NOTE — Anesthesia Procedure Notes (Signed)
Procedure Name: Intubation Date/Time: 06/24/2014 7:39 AM Performed by: Maryella Shivers Pre-anesthesia Checklist: Patient identified, Emergency Drugs available, Suction available and Patient being monitored Patient Re-evaluated:Patient Re-evaluated prior to inductionOxygen Delivery Method: Circle System Utilized Preoxygenation: Pre-oxygenation with 100% oxygen Intubation Type: IV induction Ventilation: Mask ventilation without difficulty Laryngoscope Size: Mac and 4 Grade View: Grade II Tube type: Oral Tube size: 8.0 mm Number of attempts: 1 Airway Equipment and Method: Stylet and Oral airway Placement Confirmation: ETT inserted through vocal cords under direct vision,  positive ETCO2 and breath sounds checked- equal and bilateral Secured at: 22 cm Tube secured with: Tape Dental Injury: Teeth and Oropharynx as per pre-operative assessment

## 2014-06-25 ENCOUNTER — Encounter (HOSPITAL_BASED_OUTPATIENT_CLINIC_OR_DEPARTMENT_OTHER): Payer: Self-pay | Admitting: Surgery

## 2014-06-26 ENCOUNTER — Other Ambulatory Visit: Payer: Self-pay | Admitting: General Surgery

## 2014-06-28 LAB — CEA: CEA: 1.8 ng/mL (ref 0.0–5.0)

## 2014-06-28 LAB — CANCER ANTIGEN 19-9: CA 19-9: 78.4 U/mL — ABNORMAL HIGH (ref <35.0)

## 2014-07-12 ENCOUNTER — Other Ambulatory Visit (INDEPENDENT_AMBULATORY_CARE_PROVIDER_SITE_OTHER): Payer: Self-pay | Admitting: General Surgery

## 2014-07-12 DIAGNOSIS — C23 Malignant neoplasm of gallbladder: Secondary | ICD-10-CM

## 2014-07-15 ENCOUNTER — Telehealth: Payer: Self-pay | Admitting: Oncology

## 2014-07-15 ENCOUNTER — Other Ambulatory Visit: Payer: Self-pay | Admitting: *Deleted

## 2014-07-15 NOTE — Telephone Encounter (Signed)
Chart delivered 07/15/14

## 2014-07-15 NOTE — [facility] Oncology Navigator Note (Signed)
Left VM for patient to call office regarding new patient referral appointment. Ask for GI Navigator. Dr. Benay Spice will see him on 2/16 at 2 pm. POF to scheduler.

## 2014-07-15 NOTE — [facility] Oncology Navigator Note (Signed)
Wife left VM leaving cell # and home # to be called for his appointment. Called home # and left VM with new appointment for 07/23/14 at 2pm. Requested she return call and confirm.

## 2014-07-15 NOTE — Telephone Encounter (Signed)
Per 02/08 POF, pt has been called by RN this is NP per Dr. Benay Spice add NP 60 minutes on 02/16 at 2pm...Marland KitchenMarland KitchenMarland Kitchen KJ

## 2014-07-16 ENCOUNTER — Encounter: Payer: Self-pay | Admitting: *Deleted

## 2014-07-16 ENCOUNTER — Ambulatory Visit (HOSPITAL_BASED_OUTPATIENT_CLINIC_OR_DEPARTMENT_OTHER): Payer: Medicare Other | Admitting: Oncology

## 2014-07-16 ENCOUNTER — Telehealth: Payer: Self-pay | Admitting: *Deleted

## 2014-07-16 VITALS — BP 135/64 | HR 81 | Temp 97.6°F | Resp 18 | Ht 73.0 in | Wt 187.1 lb

## 2014-07-16 DIAGNOSIS — Z86010 Personal history of colonic polyps: Secondary | ICD-10-CM

## 2014-07-16 DIAGNOSIS — R1011 Right upper quadrant pain: Secondary | ICD-10-CM

## 2014-07-16 DIAGNOSIS — Z800 Family history of malignant neoplasm of digestive organs: Secondary | ICD-10-CM

## 2014-07-16 DIAGNOSIS — Z809 Family history of malignant neoplasm, unspecified: Secondary | ICD-10-CM

## 2014-07-16 DIAGNOSIS — C23 Malignant neoplasm of gallbladder: Secondary | ICD-10-CM | POA: Insufficient documentation

## 2014-07-16 DIAGNOSIS — R970 Elevated carcinoembryonic antigen [CEA]: Secondary | ICD-10-CM

## 2014-07-16 NOTE — Progress Notes (Signed)
Met with patient and wife during new patient visit. Explained the role of GI Navigator and provided new patient information packet with information on: Gallbladder cancer, support groups,advanced directive and Astronomer. Answered questions and reviewed current treatment plan using TEACH back method. Will add to GI Conference on 08/07/14 after his surgery on 08/01/14.

## 2014-07-16 NOTE — Telephone Encounter (Signed)
Wife left VM that they have conflict with the 1/84 appointment due another MD appointment at 1:30 that day-6 month follow up with Dr. Noralee Space (dermatology) r/t his melanoma history. Called wife's work # and was put through to Designer, television/film set. Called [place] at home and he was offered to see Dr. Benay Spice today at 3:30 pm instead of 07/23/14. He agrees to this appointment and feels he can make it. He will call wife. Called wife's work # again and was put through to Designer, television/film set. Gave her appointment for today at 3:30 pm and requested return call to confirm.

## 2014-07-16 NOTE — Progress Notes (Signed)
Ronnie New Patient Consult   Referring FB:[id number] Byerly   Trask O Gatt 74 y.o.  [birth year]    Reason for Referral: Gallbladder carcinoma   HPI: Mr. Groft reports an acute episode of right upper abdominal pain during the summer of 2015. He had recurrent similar pain 05/24/2014 after eating [place] food. A right upper quadrant ultrasound revealed at least one stone in the gallbladder fundus measuring 2.8 cm. A focal area of gallbladder wall thickening measured up to 1.2 cm in thickness. A CT of the abdomen 05/24/2014 revealed no focal hepatic lesion. 2 large gallstones were noted in the lumen with mild gallbladder wall thickening. No pericolic cystic fluid. No biliary duct dilatation. The pancreas appeared normal. No retroperitoneal or periportal lymphadenopathy.  He was referred to [name] and a cholecystectomy was recommended. A laparoscopic cholecystectomy was performed on 06/24/2014. The pathology ([id number]) confirmed an invasive poorly differentiated adenocarcinoma of the gallbladder measuring 2.5 cm. Adenocarcinoma extended into attached hepatic parenchyma and is present at the inked parenchymal margin. Adenocarcinoma was present at the cystic duct margin. Diffuse lymphovascular invasion was identified. One benign. Cystic lymph node.  He was referred to Dr. Barry Dienes and is scheduled for staging CT scans and additional surgery.  Mr. Marsala reports feeling well. Past Medical History  Diagnosis Date  . Arthritis   . Hyperlipidemia   . History of colon polyps   .  history of Symptomatic PVCs   . COPD (chronic obstructive pulmonary disease)   . Hypertension   . Left hydrocele   . Bilateral carotid artery stenosis     PER DUPLEX 11-17-2012---  RICA 2-35%/  LICA  36-14%  . Mild aortic valve stenosis   . Wears glasses     .   Bilateral hearing loss, wears hearing aids   .   History of a basal cell carcinoma  Past Surgical History  Procedure  Laterality Date  . Stapedectomy Bilateral 1970  . Shoulder surgery Right 2004  . Transthoracic echocardiogram  03-20-2013  DR WALL    GRADE I DIASTOLIC DYSFUNCTION/  EF 65-70%/  MILD AORTIC STENOSIS WITH STABLE GRADIENTS  . Tonsillectomy  1962  . Hydrocele excision Left 05/10/2013    Procedure: HYDROCELECTOMY ADULT;  Surgeon: Franchot Gallo, MD;  Location: [facility];  Service: Urology;  Laterality: Left;  . Colonoscopy   06/30/2011   . Cholecystectomy N/A 06/24/2014    Procedure: LAPAROSCOPIC CHOLECYSTECTOMY;  Surgeon: Coralie Keens, MD;  Location: [facility];  Service: General;  Laterality: N/A;    Medications: Reviewed  Allergies:  Allergies  Allergen Reactions  . Lodine [Etodolac] Other (See Comments)    URINARY RETENTION  . Flexeril [Cyclobenzaprine Hcl] Rash  . Tetracyclines & Related Rash    Family history: A brother died of melanoma. Another brother had colon cancer. A brother had a "brain "tumor. His sister had metastatic breast cancer. He had 5 brothers and 2 sisters. One son. No other family history of cancer.  Social History:   He lives in [place]. He works as a Dealer and former. He quit smoking cigarettes in 1985. No current alcohol use.  History  Alcohol Use No    History  Smoking status  . Former Smoker -- 1.00 packs/day for 10 years  . Types: Cigarettes  . Quit date: 06/18/1983  Smokeless tobacco  . Never Used      ROS:   Positives include: Intermittent upper abdomen pain beginning in the summer of 2015-resolved following the  cholecystectomy  A complete ROS was otherwise negative.  Physical Exam:  Blood pressure 135/64, pulse 81, temperature 97.6 F (36.4 C), temperature source Oral, resp. rate 18, height 6\' 1"  (1.854 m), weight 187 lb 1.6 oz (84.868 kg), SpO2 99 %.  HEENT: Oropharynx without visible mass, neck without mass Lungs: Clear bilaterally, distant breath sounds, no respiratory distress Cardiac:  Regular rate and rhythm Abdomen: No hepatosplenomegaly, nontender, no mass, healed surgical incisions GU: Testes without mass  Vascular: No leg edema Lymph nodes: No cervical, supra-clavicular, axillary, or inguinal nodes Neurologic: Alert and oriented, the motor exam appears intact in the upper and lower extremities Skin: No rash Musculoskeletal: No spine tenderness   LAB:  CBC  Lab Results  Component Value Date   WBC 6.7 05/27/2014   HGB 13.9 06/24/2014   HCT 43.3 05/27/2014   MCV 91.7 05/27/2014   PLT 223 05/27/2014   NEUTROABS 4.9 05/27/2014     CMP      Component Value Date/Time   NA 139 06/20/2014 0825   K 4.3 06/20/2014 0825   CL 103 06/20/2014 0825   CO2 30 06/20/2014 0825   GLUCOSE 111* 06/20/2014 0825   BUN 18 06/20/2014 0825   CREATININE 1.06 06/20/2014 0825   CREATININE 0.91 12/18/2013 0803   CALCIUM 9.1 06/20/2014 0825   PROT 7.8 05/27/2014 1148   ALBUMIN 4.2 05/27/2014 1148   AST 22 05/27/2014 1148   ALT 27 05/27/2014 1148   ALKPHOS 100 05/27/2014 1148   BILITOT 0.6 05/27/2014 1148   GFRNONAA 67* 06/20/2014 0825   GFRAA 77* 06/20/2014 0825    Lab Results  Component Value Date   CEA 1.8 06/26/2014   CA 19-9        78.4 on 06/26/2014   Imaging: As per history of present illness   Assessment/Plan:   1. 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   Intermittent right upper abdominal pain beginning in the summer of 2015-likely related to symptomatic cholelithiasis  3.   History of colon polyps  4.   Family history of multiple cancers including colon cancer   Disposition:   Mr. Nicholson has been diagnosed with adenocarcinoma of the gallbladder. This was an incidental finding when he  underwent a cholecystectomy for treatment of symptomatic cholelithiasis. He appears to have locally advanced disease based on the staging evaluation to date. He is scheduled for reexcision surgery with Dr. Barry Dienes in an attempt to obtain an R0 resection.  The mildly elevated CA 19-9 may be related to the 06/24/2014 surgery.   I will see him after surgery to discuss the indication for adjuvant therapy. We will see him sooner if the staging CT scans reveal evidence of metastatic disease.  [place], [place] 07/16/2014, 4:23 PM

## 2014-07-16 NOTE — Patient Instructions (Signed)
[facility] Discharge Instructions  RECOMMENDATIONS MADE BY THE CONSULTANT AND ANY TEST RESULTS WILL BE SENT TO YOUR REFERRING PHYSICIAN.  EXAM FINDINGS BY THE PHYSICIAN TODAY : Stage IIIa gallbladder cancer.   MEDICATIONS PRESCRIBED:  None  INSTRUCTIONS GIVEN AND DISCUSSED: F/U with Dr. Barry Dienes for surgery to obtain clean surgical margins. Will see Dr. Benay Spice again 3 weeks post op.  SPECIAL INSTRUCTIONS/FOLLOW-UP: If margins are negative, no further treatment is necessary. If unable to obtain clean margin, may discuss local radiation and oral chemo. We will present your case at the GI conference.  Thank you for choosing [facility] to provide your oncology and hematology care.  To afford each patient quality time with our providers, please arrive at least 30 minutes before your scheduled appointment time.  With your help, our goal is to use those 30 minutes to complete the necessary work-up to ensure our physicians have the information they need to help with your evaluation and healthcare recommendations.     ___________________  Should you have questions after your visit to [facility], please contact our office at [phone] between the hours of 8:30 a.m. and 4:30 p.m.  Voicemails left after 4:00 p.m. will not be returned until the following business day.  For prescription refill requests, have your pharmacy contact our office with your prescription refill request. We request 24 hour notice for all refill requests.

## 2014-07-17 ENCOUNTER — Ambulatory Visit
Admission: RE | Admit: 2014-07-17 | Discharge: 2014-07-17 | Disposition: A | Discharge status: Home / Self Care | Payer: Medicare Other | Source: Ambulatory Visit | Attending: General Surgery | Admitting: General Surgery

## 2014-07-17 ENCOUNTER — Other Ambulatory Visit: Payer: Medicare Other

## 2014-07-17 ENCOUNTER — Telehealth: Payer: Self-pay | Admitting: Oncology

## 2014-07-17 DIAGNOSIS — C23 Malignant neoplasm of gallbladder: Secondary | ICD-10-CM

## 2014-07-17 MED ORDER — IOHEXOL 300 MG/ML  SOLN
100.0000 mL | Freq: Once | INTRAMUSCULAR | Status: AC | PRN
  Administered 2014-07-17: 100 mL via INTRAVENOUS

## 2014-07-17 NOTE — Telephone Encounter (Signed)
Left message to confirm all appointments. Mailed calendar.

## 2014-07-23 ENCOUNTER — Ambulatory Visit: Payer: Medicare Other | Admitting: Oncology

## 2014-07-23 NOTE — Progress Notes (Signed)
Please put orders in Epic surgery 08-01-14 pre op 07-24-14 Thanks

## 2014-07-24 ENCOUNTER — Encounter (HOSPITAL_COMMUNITY): Payer: Self-pay

## 2014-07-24 ENCOUNTER — Encounter (HOSPITAL_COMMUNITY)
Admission: RE | Admit: 2014-07-24 | Discharge: 2014-07-24 | Disposition: A | Discharge status: Home / Self Care | Payer: Medicare Other | Source: Ambulatory Visit | Attending: General Surgery | Admitting: General Surgery

## 2014-07-24 DIAGNOSIS — C23 Malignant neoplasm of gallbladder: Secondary | ICD-10-CM | POA: Insufficient documentation

## 2014-07-24 DIAGNOSIS — Z01818 Encounter for other preprocedural examination: Secondary | ICD-10-CM | POA: Insufficient documentation

## 2014-07-24 HISTORY — DX: Other injury of unspecified body region, initial encounter: T14.8XXA

## 2014-07-24 LAB — BASIC METABOLIC PANEL WITH GFR
Anion gap: 6 (ref 5–15)
BUN: 13 mg/dL (ref 6–23)
CO2: 29 mmol/L (ref 19–32)
Calcium: 9.1 mg/dL (ref 8.4–10.5)
Chloride: 108 mmol/L (ref 96–112)
Creatinine, Ser: 0.92 mg/dL (ref 0.50–1.35)
GFR calc Af Amer: >90 mL/min
GFR calc non Af Amer: 80 mL/min — ABNORMAL LOW
Glucose, Bld: 96 mg/dL (ref 70–99)
Potassium: 4.6 mmol/L (ref 3.5–5.1)
Sodium: 143 mmol/L (ref 135–145)

## 2014-07-24 LAB — CBC
HCT: 40.4 % (ref 39.0–52.0)
Hemoglobin: 13 g/dL (ref 13.0–17.0)
MCH: 29.3 pg (ref 26.0–34.0)
MCHC: 32.2 g/dL (ref 30.0–36.0)
MCV: 91.2 fL (ref 78.0–100.0)
PLATELETS: 207 10*3/uL (ref 150–400)
RBC: 4.43 MIL/uL (ref 4.22–5.81)
RDW: 12.7 % (ref 11.5–15.5)
WBC: 7.8 10*3/uL (ref 4.0–10.5)

## 2014-07-24 NOTE — Progress Notes (Signed)
   07/24/14 1110  OBSTRUCTIVE SLEEP APNEA  Have you ever been diagnosed with sleep apnea through a sleep study? No  Do you snore loudly (loud enough to be heard through closed doors)?  1  Do you often feel tired, fatigued, or sleepy during the daytime? 0  Has anyone observed you stop breathing during your sleep? 1  Do you have, or are you being treated for high blood pressure? 0  BMI more than 35 kg/m2? 0  Age over 76 years old? 1  Neck circumference greater than 40 cm/16 inches? 0  Gender: 1  Obstructive Sleep Apnea Score 4  Score 4 or greater  Results sent to PCP

## 2014-07-24 NOTE — Patient Instructions (Addendum)
[place]  07/24/2014   Your procedure is scheduled on: Thursday February 25th, 2016  Report to [facility] Main  Entrance and follow signs to               [place] at 840 AM.  Call this number if you have problems the morning of surgery [phone]   Remember:  Do not eat food or drink liquids :After Midnight.     Take these medicines the morning of surgery with A SIP OF WATER: metoprolol                              You may not have any metal on your body including hair pins and              piercings  Do not wear jewelry, make-up, lotions, powders or perfumes.             Do not wear nail polish.  Do not shave  48 hours prior to surgery.              Men may shave face and neck.   Do not bring valuables to the hospital. [place].  Contacts, dentures or bridgework may not be worn into surgery.  Leave suitcase in the car. After surgery it may be brought to your room.     Patients discharged the day of surgery will not be allowed to drive home.  Name and phone number of your driver:  Special Instructions: N/A              Please read over the following fact sheets you were given: _____________________________________________________________________             [facility] - Preparing for Surgery Before surgery, you can play an important role.  Because skin is not sterile, your skin needs to be as free of germs as possible.  You can reduce the number of germs on your skin by washing with CHG (chlorahexidine gluconate) soap before surgery.  CHG is an antiseptic cleaner which kills germs and bonds with the skin to continue killing germs even after washing. Please DO NOT use if you have an allergy to CHG or antibacterial soaps.  If your skin becomes reddened/irritated stop using the CHG and inform your nurse when you arrive at Short Stay. Do not shave (including legs and underarms) for at  least 48 hours prior to the first CHG shower.  You may shave your face/neck. Please follow these instructions carefully:  1.  Shower with CHG Soap the night before surgery and the  morning of Surgery.  2.  If you choose to wash your hair, wash your hair first as usual with your  normal  shampoo.  3.  After you shampoo, rinse your hair and body thoroughly to remove the  shampoo.                           4.  Use CHG as you would any other liquid soap.  You can apply chg directly  to the skin and wash  Gently with a scrungie or clean washcloth.  5.  Apply the CHG Soap to your body ONLY FROM THE NECK DOWN.   Do not use on face/ open                           Wound or open sores. Avoid contact with eyes, ears mouth and genitals (private parts).                       Wash face,  Genitals (private parts) with your normal soap.             6.  Wash thoroughly, paying special attention to the area where your surgery  will be performed.  7.  Thoroughly rinse your body with warm water from the neck down.  8.  DO NOT shower/wash with your normal soap after using and rinsing off  the CHG Soap.                9.  Pat yourself dry with a clean towel.            10.  Wear clean pajamas.            11.  Place clean sheets on your bed the night of your first shower and do not  sleep with pets. Day of Surgery : Do not apply any lotions/deodorants the morning of surgery.  Please wear clean clothes to the hospital/surgery center.  FAILURE TO FOLLOW THESE INSTRUCTIONS MAY RESULT IN THE CANCELLATION OF YOUR SURGERY PATIENT SIGNATURE_________________________________  NURSE SIGNATURE__________________________________  ________________________________________________________________________

## 2014-07-24 NOTE — Progress Notes (Signed)
Echo 10-14 epic ekg 01-14-14 epic Chest ct 07-17-14 epic Carotid duplex 06-13-13 epic lov cardiology dr Bronson Ing 01-14-14 epic

## 2014-07-27 ENCOUNTER — Other Ambulatory Visit (INDEPENDENT_AMBULATORY_CARE_PROVIDER_SITE_OTHER): Payer: Self-pay | Admitting: General Surgery

## 2014-07-27 DIAGNOSIS — R1011 Right upper quadrant pain: Secondary | ICD-10-CM

## 2014-07-29 ENCOUNTER — Other Ambulatory Visit: Payer: Medicare Other

## 2014-07-29 ENCOUNTER — Ambulatory Visit (HOSPITAL_BASED_OUTPATIENT_CLINIC_OR_DEPARTMENT_OTHER): Payer: Medicare Other | Admitting: Genetic Counselor

## 2014-07-29 ENCOUNTER — Encounter: Payer: Self-pay | Admitting: Genetic Counselor

## 2014-07-29 DIAGNOSIS — Z803 Family history of malignant neoplasm of breast: Secondary | ICD-10-CM

## 2014-07-29 DIAGNOSIS — Z8052 Family history of malignant neoplasm of bladder: Secondary | ICD-10-CM

## 2014-07-29 DIAGNOSIS — C23 Malignant neoplasm of gallbladder: Secondary | ICD-10-CM

## 2014-07-29 DIAGNOSIS — Z808 Family history of malignant neoplasm of other organs or systems: Secondary | ICD-10-CM

## 2014-07-29 DIAGNOSIS — Z800 Family history of malignant neoplasm of digestive organs: Secondary | ICD-10-CM

## 2014-07-29 DIAGNOSIS — Z8042 Family history of malignant neoplasm of prostate: Secondary | ICD-10-CM

## 2014-07-29 DIAGNOSIS — Z8489 Family history of other specified conditions: Secondary | ICD-10-CM

## 2014-07-29 DIAGNOSIS — C801 Malignant (primary) neoplasm, unspecified: Secondary | ICD-10-CM | POA: Insufficient documentation

## 2014-07-29 NOTE — Progress Notes (Signed)
REFERRING PROVIDER: Mikey Kirschner, MD [place], [place] [address]   Betsy Coder, MD  PRIMARY PROVIDER:  Rubbie Battiest, MD  PRIMARY REASON FOR VISIT:  1. Gallbladder cancer   2. Family history of colon cancer   3. Family history of breast cancer   4. Family history of brain tumor   5. Family history of melanoma   6. Family history of bladder cancer   7. Family history of prostate cancer      HISTORY OF PRESENT ILLNESS:   Mr. Scheck, a 74 y.o. male, was seen for a [place] cancer genetics consultation at the request of Dr. Benay Spice due to a personal and family history of cancer.  Mr. Memmott presents to clinic today to discuss the possibility of a hereditary predisposition to cancer, genetic testing, and to further clarify his future cancer risks, as well as potential cancer risks for family members.   In 2016, at the age of 13, Mr. 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 This will be treated with surgery this Thursday, and possibly chemo or radiation.  He has had two colonoscopys, one of which found two polyps.  Based on this polyp count and his family history of colon cancer Mr. Tamer is on a 5 year colonoscopy schedule.    CANCER HISTORY:   No history exists.    Past Medical History  Diagnosis Date  . Arthritis   . Hyperlipidemia   . History of colon polyps   . Symptomatic PVCs   . COPD (chronic obstructive pulmonary disease)   . Left hydrocele   . Bilateral carotid artery stenosis     PER DUPLEX 11-17-2012---  RICA 0-76%/  LICA  22-63%  . Mild aortic valve stenosis   . Wears glasses   . Skin abrasion     right temple, left hand areas removed by dermatology last week, areas healing  . Cancer 2106    gallbladder cancer  . Family history of colon cancer   . Family history of breast cancer     Past Surgical History  Procedure Laterality Date  . Stapedectomy Bilateral 1965 left, 1981 right  . Shoulder surgery Right 2004     rotator cuff  . Transthoracic echocardiogram  03-20-2013  DR WALL    GRADE I DIASTOLIC DYSFUNCTION/  EF 65-70%/  MILD AORTIC STENOSIS WITH STABLE GRADIENTS  . Tonsillectomy  1962  . Hydrocele excision Left 05/10/2013    Procedure: HYDROCELECTOMY ADULT;  Surgeon: Franchot Gallo, MD;  Location: [facility];  Service: Urology;  Laterality: Left;  . Colonoscopy    . Cholecystectomy N/A 06/24/2014    Procedure: LAPAROSCOPIC CHOLECYSTECTOMY;  Surgeon: Coralie Keens, MD;  Location: [facility];  Service: General;  Laterality: N/A;    History   Social History  . Marital Status: Married    Spouse Name: Thayer Headings  . Number of Children: 1  . Years of Education: N/A   Occupational History  . retired     08-2003   Social History Main Topics  . Smoking status: Former Smoker -- 1.00 packs/day for 10 years    Types: Cigarettes    Quit date: 06/18/1983  . Smokeless tobacco: Never Used  . Alcohol Use: No  . Drug Use: No  . Sexual Activity: Not on file   Other Topics Concern  . None   Social History Narrative     FAMILY HISTORY:  We obtained a detailed, 4-generation family history.  Significant  diagnoses are listed below: Family History  Problem Relation Age of Onset  . Colon cancer Father 21  . Colon cancer Brother 80  . Colon cancer Brother   . Breast cancer Sister 55  . Cancer Maternal Uncle     NOS  . Breast cancer Paternal Aunt     dx <50  . Cancer Paternal Uncle     NOS  . Cancer Brother 70    cancer in a gland in his neck  . Brain cancer Brother 6  . Melanoma Brother 54  . Breast cancer Cousin     6 paternal first cousins    Mr. Vasil has five brothers and two sisters, all but one sister have had cancer. Mr. Yono father had colon cancer at 75.  His father had three brothers and three sisters.  One sister had breast cancer and a brother had an unknown cancer.  Six paternal first cousins (male) had breast cancer.  Mr. Rack  has one maternal cousin with prostate and bladder cancer. Patient's ancestors are of Caucasian descent. There is no reported Ashkenazi Jewish ancestry. There is no known consanguinity.  GENETIC COUNSELING ASSESSMENT: LEOCADIO HEAL is a 74 y.o. male with a personal history of gallbladder cancer and a family history of colon, breast, brain, bladder and prostate cancer which somewhat suggestive of a hereditary cancer syndrome, possibly Lynch syndrome, and predisposition to cancer. We, therefore, discussed and recommended the following at today's visit.   DISCUSSION: We reviewed the characteristics, features and inheritance patterns of hereditary cancer syndromes. Based on his personal history of gallbladder cancer and family history of colon, brain and breast cancer, there is a possibility that the cancer in the family could be the result of Lynch syndrome. We discussed that there is not a distinct cancer pattern based on the cancers in his family.  We also discussed genetic testing, including the appropriate family members to test, the process of testing, insurance coverage and turn-around-time for results. We discussed the implications of a negative, positive and/or variant of uncertain significant result. We recommended Mr. Gockley pursue genetic testing for the [employer].   PLAN: After considering the risks, benefits, and limitations,Mr. Murren  provided informed consent to pursue genetic testing and the blood sample was sent to [facility] for analysis of the Multi-Cancer gene panel. Results should be available within approximately 3-4 weeks' time, at which point they will be disclosed by telephone to Mr. Librizzi, as will any additional recommendations warranted by these results. Mr. Wanninger will receive a summary of his genetic counseling visit and a copy of his results once available. This information will also be available in Epic. We encouraged Mr. Victorio to  remain in contact with cancer genetics annually so that we can continuously update the family history and inform him of any changes in cancer genetics and testing that may be of benefit for his family. Mr. Helget questions were answered to his satisfaction today. Our contact information was provided should additional questions or concerns arise.  Lastly, we encouraged Mr. Harter to remain in contact with cancer genetics annually so that we can continuously update the family history and inform him of any changes in cancer genetics and testing that may be of benefit for this family.   Mr.  Avina questions were answered to his satisfaction today. Our contact information was provided should additional questions or concerns arise. Thank you for the referral and allowing [place] to share in the care of your  patient.   Trelyn Vanderlinde P. Florene Glen, [place], [facility] Certified Genetic Counselor [email or web address] phone: [phone]  The patient was seen for a total of 75 minutes in face-to-face genetic counseling.  This patient was discussed with Drs. Magrinat, Lindi Adie and/or Burr Medico who agrees with the above.    _______________________________________________________________________ For Office Staff:  Number of people involved in session: 2 Was an Intern/ student involved with case: no

## 2014-07-30 ENCOUNTER — Other Ambulatory Visit (HOSPITAL_COMMUNITY): Payer: Self-pay | Admitting: *Deleted

## 2014-07-30 NOTE — Progress Notes (Signed)
Spoke with patient by phone, patient aware urine specimen needed for day of surgery and additional labs will be drawn day of surgery and patient aware to do fleets enema night before surgery and patient aware to follow all bowel prep instructions and clear liquid instructions from dr Barry Dienes.

## 2014-07-31 NOTE — Anesthesia Preprocedure Evaluation (Addendum)
Anesthesia Evaluation  Patient identified by MRN, date of birth, ID band Patient awake    Reviewed: Allergy & Precautions, NPO status , Patient's Chart, lab work & pertinent test results  History of Anesthesia Complications Negative for: history of anesthetic complications  Airway Mallampati: II  TM Distance: >3 FB Neck ROM: Full    Dental no notable dental hx. (+) Dental Advisory Given   Pulmonary COPDformer smoker,  breath sounds clear to auscultation  Pulmonary exam normal       Cardiovascular + Peripheral Vascular Disease + dysrhythmias + Valvular Problems/Murmurs AS Rhythm:Regular Rate:Normal  Echocardiogram performed on 03/20/2013 demonstrated normal left ventricular systolic function, EF 14-23%, grade 1 diastolic dysfunction, and mild aortic stenosis with a mean gradient of 13 mm mercury. Carotid Dopplers on 06/13/2013 demonstrated 1-39% right internal carotid artery stenosis and 60-79% left internal carotid artery stenosis.   Neuro/Psych negative neurological ROS  negative psych ROS   GI/Hepatic negative GI ROS, Neg liver ROS,   Endo/Other  negative endocrine ROS  Renal/GU negative Renal ROS  negative genitourinary   Musculoskeletal  (+) Arthritis -, Osteoarthritis,    Abdominal   Peds negative pediatric ROS (+)  Hematology negative hematology ROS (+)   Anesthesia Other Findings   Reproductive/Obstetrics negative OB ROS                            Anesthesia Physical Anesthesia Plan  ASA: III  Anesthesia Plan: General   Post-op Pain Management:    Induction: Intravenous  Airway Management Planned: Oral ETT  Additional Equipment: Arterial line  Intra-op Plan:   Post-operative Plan: Extubation in OR  Informed Consent: I have reviewed the patients History and Physical, chart, labs and discussed the procedure including the risks, benefits and alternatives for the proposed  anesthesia with the patient or authorized representative who has indicated his/her understanding and acceptance.   Dental advisory given  Plan Discussed with: CRNA  Anesthesia Plan Comments:         Anesthesia Quick Evaluation

## 2014-08-01 ENCOUNTER — Encounter (HOSPITAL_COMMUNITY)
Admission: RE | Disposition: A | Discharge status: Home / Self Care | Payer: Self-pay | Source: Ambulatory Visit | Attending: General Surgery

## 2014-08-01 ENCOUNTER — Inpatient Hospital Stay (HOSPITAL_COMMUNITY)
Admission: RE | Admit: 2014-08-01 | Discharge: 2014-08-06 | DRG: 407 | Disposition: A | Discharge status: Home / Self Care | Payer: Medicare Other | Source: Ambulatory Visit | Attending: General Surgery | Admitting: General Surgery

## 2014-08-01 ENCOUNTER — Inpatient Hospital Stay (HOSPITAL_COMMUNITY): Payer: Medicare Other | Admitting: Anesthesiology

## 2014-08-01 ENCOUNTER — Encounter (HOSPITAL_COMMUNITY): Payer: Self-pay | Admitting: *Deleted

## 2014-08-01 DIAGNOSIS — Z886 Allergy status to analgesic agent status: Secondary | ICD-10-CM

## 2014-08-01 DIAGNOSIS — I10 Essential (primary) hypertension: Secondary | ICD-10-CM | POA: Diagnosis present

## 2014-08-01 DIAGNOSIS — Z7982 Long term (current) use of aspirin: Secondary | ICD-10-CM | POA: Diagnosis not present

## 2014-08-01 DIAGNOSIS — Z888 Allergy status to other drugs, medicaments and biological substances status: Secondary | ICD-10-CM

## 2014-08-01 DIAGNOSIS — Z9049 Acquired absence of other specified parts of digestive tract: Secondary | ICD-10-CM | POA: Diagnosis present

## 2014-08-01 DIAGNOSIS — Z881 Allergy status to other antibiotic agents status: Secondary | ICD-10-CM

## 2014-08-01 DIAGNOSIS — Z79899 Other long term (current) drug therapy: Secondary | ICD-10-CM | POA: Diagnosis not present

## 2014-08-01 DIAGNOSIS — C23 Malignant neoplasm of gallbladder: Secondary | ICD-10-CM | POA: Diagnosis present

## 2014-08-01 HISTORY — PX: OPEN PARTIAL HEPATECTOMY [83]: SHX5987

## 2014-08-01 LAB — CBC
HCT: 35.5 % — ABNORMAL LOW (ref 39.0–52.0)
Hemoglobin: 11.6 g/dL — ABNORMAL LOW (ref 13.0–17.0)
MCH: 29.5 pg (ref 26.0–34.0)
MCHC: 32.7 g/dL (ref 30.0–36.0)
MCV: 90.3 fL (ref 78.0–100.0)
PLATELETS: 192 10*3/uL (ref 150–400)
RBC: 3.93 MIL/uL — AB (ref 4.22–5.81)
RDW: 12.6 % (ref 11.5–15.5)
WBC: 14 10*3/uL — ABNORMAL HIGH (ref 4.0–10.5)

## 2014-08-01 LAB — PROTIME-INR
INR: 1.06 (ref 0.00–1.49)
Prothrombin Time: 14 seconds (ref 11.6–15.2)

## 2014-08-01 LAB — HEPATIC FUNCTION PANEL
ALK PHOS: 85 U/L (ref 39–117)
ALT: 18 U/L (ref 0–53)
AST: 24 U/L (ref 0–37)
Albumin: 4.3 g/dL (ref 3.5–5.2)
BILIRUBIN INDIRECT: 1.2 mg/dL — AB (ref 0.3–0.9)
Bilirubin, Direct: 0.2 mg/dL (ref 0.0–0.5)
TOTAL PROTEIN: 7.4 g/dL (ref 6.0–8.3)
Total Bilirubin: 1.4 mg/dL — ABNORMAL HIGH (ref 0.3–1.2)

## 2014-08-01 LAB — CREATININE, SERUM
Creatinine, Ser: 0.95 mg/dL (ref 0.50–1.35)
GFR calc Af Amer: >90 mL/min (ref >90)
GFR calc non Af Amer: 79 mL/min — ABNORMAL LOW (ref >90)

## 2014-08-01 LAB — CBC WITH DIFFERENTIAL/PLATELET
Basophils Absolute: 0.1 10*3/uL (ref 0.0–0.1)
Basophils Relative: 1 % (ref 0–1)
Eosinophils Absolute: 0.1 10*3/uL (ref 0.0–0.7)
Eosinophils Relative: 2 % (ref 0–5)
HCT: 40.8 % (ref 39.0–52.0)
HEMOGLOBIN: 13.2 g/dL (ref 13.0–17.0)
LYMPHS ABS: 1.1 10*3/uL (ref 0.7–4.0)
Lymphocytes Relative: 18 % (ref 12–46)
MCH: 29.4 pg (ref 26.0–34.0)
MCHC: 32.4 g/dL (ref 30.0–36.0)
MCV: 90.9 fL (ref 78.0–100.0)
MONO ABS: 0.5 10*3/uL (ref 0.1–1.0)
MONOS PCT: 8 % (ref 3–12)
NEUTROS ABS: 4.3 10*3/uL (ref 1.7–7.7)
NEUTROS PCT: 71 % (ref 43–77)
Platelets: 240 10*3/uL (ref 150–400)
RBC: 4.49 MIL/uL (ref 4.22–5.81)
RDW: 12.8 % (ref 11.5–15.5)
WBC: 6.1 10*3/uL (ref 4.0–10.5)

## 2014-08-01 LAB — ABO/RH: ABO/RH(D): O POS

## 2014-08-01 LAB — APTT: APTT: 30 s (ref 24–37)

## 2014-08-01 SURGERY — HEPATECTOMY, PARTIAL, OPEN
Anesthesia: General | Site: Abdomen

## 2014-08-01 MED ORDER — ONDANSETRON HCL 4 MG/2ML IJ SOLN
INTRAMUSCULAR | Status: AC
  Filled 2014-08-01: qty 2

## 2014-08-01 MED ORDER — MORPHINE SULFATE (PF) 1 MG/ML IV SOLN
INTRAVENOUS | Status: DC
  Administered 2014-08-01: 17:00:00 via INTRAVENOUS
  Administered 2014-08-01: 5 mg via INTRAVENOUS
  Administered 2014-08-02 (×2): 3 mg via INTRAVENOUS
  Administered 2014-08-02: 0 mg via INTRAVENOUS
  Administered 2014-08-02: 2 mg via INTRAVENOUS
  Administered 2014-08-02: 4 mg via INTRAVENOUS
  Administered 2014-08-02: 5 mg via INTRAVENOUS
  Administered 2014-08-03: 2 mg via INTRAVENOUS
  Filled 2014-08-01: qty 25

## 2014-08-01 MED ORDER — ONDANSETRON HCL 4 MG/2ML IJ SOLN: 4.0000 mg | Freq: Four times a day (QID) | INTRAMUSCULAR | Status: DC | PRN

## 2014-08-01 MED ORDER — ONDANSETRON HCL 4 MG/2ML IJ SOLN
INTRAMUSCULAR | Status: DC | PRN
  Administered 2014-08-01: 4 mg via INTRAVENOUS

## 2014-08-01 MED ORDER — PROPOFOL 10 MG/ML IV BOLUS
INTRAVENOUS | Status: DC | PRN
  Administered 2014-08-01: 160 mg via INTRAVENOUS
  Administered 2014-08-01: 40 mg via INTRAVENOUS

## 2014-08-01 MED ORDER — CISATRACURIUM BESYLATE 20 MG/10ML IV SOLN
INTRAVENOUS | Status: AC
  Filled 2014-08-01: qty 10

## 2014-08-01 MED ORDER — FENTANYL CITRATE 0.05 MG/ML IJ SOLN
INTRAMUSCULAR | Status: DC | PRN
  Administered 2014-08-01: 100 ug via INTRAVENOUS
  Administered 2014-08-01 (×3): 50 ug via INTRAVENOUS
  Administered 2014-08-01: 100 ug via INTRAVENOUS

## 2014-08-01 MED ORDER — LIDOCAINE HCL (CARDIAC) 20 MG/ML IV SOLN
INTRAVENOUS | Status: AC
  Filled 2014-08-01: qty 5

## 2014-08-01 MED ORDER — DIPHENHYDRAMINE HCL 12.5 MG/5ML PO ELIX: 12.5000 mg | ORAL_SOLUTION | Freq: Four times a day (QID) | ORAL | Status: DC | PRN

## 2014-08-01 MED ORDER — STERILE WATER FOR IRRIGATION IR SOLN
Status: DC | PRN
  Administered 2014-08-01: 1500 mL

## 2014-08-01 MED ORDER — DIPHENHYDRAMINE HCL 50 MG/ML IJ SOLN: 12.5000 mg | Freq: Four times a day (QID) | INTRAMUSCULAR | Status: DC | PRN

## 2014-08-01 MED ORDER — METOPROLOL TARTRATE 1 MG/ML IV SOLN
5.0000 mg | Freq: Four times a day (QID) | INTRAVENOUS | Status: DC
  Administered 2014-08-01 – 2014-08-04 (×9): 5 mg via INTRAVENOUS
  Filled 2014-08-01 (×13): qty 5

## 2014-08-01 MED ORDER — EVICEL 5 ML EX KIT
PACK | CUTANEOUS | Status: DC | PRN
  Administered 2014-08-01: 1

## 2014-08-01 MED ORDER — MORPHINE SULFATE (PF) 1 MG/ML IV SOLN
INTRAVENOUS | Status: AC
  Filled 2014-08-01: qty 25

## 2014-08-01 MED ORDER — KCL IN DEXTROSE-NACL 20-5-0.45 MEQ/L-%-% IV SOLN
INTRAVENOUS | Status: DC
  Administered 2014-08-01 – 2014-08-03 (×5): via INTRAVENOUS
  Administered 2014-08-03: 100 mL/h via INTRAVENOUS
  Administered 2014-08-04: 100 mL via INTRAVENOUS
  Filled 2014-08-01 (×10): qty 1000

## 2014-08-01 MED ORDER — HEPARIN SODIUM (PORCINE) 5000 UNIT/ML IJ SOLN
5000.0000 [IU] | Freq: Three times a day (TID) | INTRAMUSCULAR | Status: DC
  Administered 2014-08-02 – 2014-08-06 (×13): 5000 [IU] via SUBCUTANEOUS
  Filled 2014-08-01 (×16): qty 1

## 2014-08-01 MED ORDER — BUPIVACAINE 0.25 % ON-Q PUMP DUAL CATH 300 ML
300.0000 mL | INJECTION | Status: DC
  Administered 2014-08-01: 300 mL
  Filled 2014-08-01: qty 300

## 2014-08-01 MED ORDER — BUPIVACAINE-EPINEPHRINE (PF) 0.25% -1:200000 IJ SOLN
INTRAMUSCULAR | Status: AC
  Filled 2014-08-01: qty 30

## 2014-08-01 MED ORDER — HYDROMORPHONE HCL 1 MG/ML IJ SOLN
INTRAMUSCULAR | Status: AC
  Filled 2014-08-01: qty 1

## 2014-08-01 MED ORDER — THROMBIN 5000 UNITS EX SOLR: Freq: Once | CUTANEOUS | Status: DC

## 2014-08-01 MED ORDER — LIDOCAINE HCL (CARDIAC) 20 MG/ML IV SOLN
INTRAVENOUS | Status: DC | PRN
  Administered 2014-08-01: 100 mg via INTRAVENOUS

## 2014-08-01 MED ORDER — HYDROMORPHONE HCL 1 MG/ML IJ SOLN
0.2500 mg | INTRAMUSCULAR | Status: DC | PRN
  Administered 2014-08-01 (×2): 0.25 mg via INTRAVENOUS
  Administered 2014-08-01: 0.5 mg via INTRAVENOUS

## 2014-08-01 MED ORDER — CEFOXITIN SODIUM 2 G IV SOLR
2.0000 g | INTRAVENOUS | Status: AC
  Administered 2014-08-01 (×2): 2 g via INTRAVENOUS

## 2014-08-01 MED ORDER — GLYCOPYRROLATE 0.2 MG/ML IJ SOLN
INTRAMUSCULAR | Status: DC | PRN
  Administered 2014-08-01: .8 mg via INTRAVENOUS

## 2014-08-01 MED ORDER — SUCCINYLCHOLINE CHLORIDE 20 MG/ML IJ SOLN
INTRAMUSCULAR | Status: DC | PRN
  Administered 2014-08-01: 100 mg via INTRAVENOUS

## 2014-08-01 MED ORDER — PHENYLEPHRINE HCL 10 MG/ML IJ SOLN
10.0000 mg | INTRAVENOUS | Status: DC | PRN
  Administered 2014-08-01: 20 ug/min via INTRAVENOUS

## 2014-08-01 MED ORDER — PHENYLEPHRINE HCL 10 MG/ML IJ SOLN
INTRAMUSCULAR | Status: AC
  Filled 2014-08-01: qty 1

## 2014-08-01 MED ORDER — SODIUM CHLORIDE 0.9 % IJ SOLN: 9.0000 mL | INTRAMUSCULAR | Status: DC | PRN

## 2014-08-01 MED ORDER — LACTATED RINGERS IV SOLN
INTRAVENOUS | Status: DC
  Administered 2014-08-01: 14:00:00 via INTRAVENOUS
  Administered 2014-08-01: 1000 mL via INTRAVENOUS
  Administered 2014-08-01: 12:00:00 via INTRAVENOUS

## 2014-08-01 MED ORDER — HYDROMORPHONE HCL 2 MG/ML IJ SOLN
INTRAMUSCULAR | Status: AC
  Filled 2014-08-01: qty 1

## 2014-08-01 MED ORDER — BUPIVACAINE ON-Q PAIN PUMP (FOR ORDER SET NO CHG)
INJECTION | Status: DC
  Filled 2014-08-01: qty 1

## 2014-08-01 MED ORDER — FENTANYL CITRATE 0.05 MG/ML IJ SOLN
INTRAMUSCULAR | Status: AC
  Filled 2014-08-01: qty 5

## 2014-08-01 MED ORDER — 0.9 % SODIUM CHLORIDE (POUR BTL) OPTIME
TOPICAL | Status: DC | PRN
  Administered 2014-08-01: 3000 mL

## 2014-08-01 MED ORDER — PROPOFOL 10 MG/ML IV BOLUS
INTRAVENOUS | Status: AC
  Filled 2014-08-01: qty 20

## 2014-08-01 MED ORDER — HYDROMORPHONE HCL 1 MG/ML IJ SOLN
INTRAMUSCULAR | Status: DC | PRN
  Administered 2014-08-01 (×4): 0.5 mg via INTRAVENOUS

## 2014-08-01 MED ORDER — LACTATED RINGERS IV SOLN: INTRAVENOUS | Status: DC

## 2014-08-01 MED ORDER — DEXAMETHASONE SODIUM PHOSPHATE 10 MG/ML IJ SOLN
INTRAMUSCULAR | Status: AC
  Filled 2014-08-01: qty 1

## 2014-08-01 MED ORDER — NEOSTIGMINE METHYLSULFATE 10 MG/10ML IV SOLN
INTRAVENOUS | Status: DC | PRN
  Administered 2014-08-01: 5 mg via INTRAVENOUS

## 2014-08-01 MED ORDER — EPHEDRINE SULFATE 50 MG/ML IJ SOLN
INTRAMUSCULAR | Status: DC | PRN
  Administered 2014-08-01 (×3): 10 mg via INTRAVENOUS
  Administered 2014-08-01: 5 mg via INTRAVENOUS

## 2014-08-01 MED ORDER — GLYCOPYRROLATE 0.2 MG/ML IJ SOLN
INTRAMUSCULAR | Status: AC
  Filled 2014-08-01: qty 4

## 2014-08-01 MED ORDER — LIDOCAINE HCL 1 % IJ SOLN
INTRAMUSCULAR | Status: AC
  Filled 2014-08-01: qty 20

## 2014-08-01 MED ORDER — DEXTROSE 5 % IV SOLN
INTRAVENOUS | Status: AC
  Filled 2014-08-01: qty 2

## 2014-08-01 MED ORDER — DEXTROSE 5 % IV SOLN
1.0000 g | Freq: Four times a day (QID) | INTRAVENOUS | Status: AC
  Administered 2014-08-01 – 2014-08-02 (×3): 1 g via INTRAVENOUS
  Filled 2014-08-01 (×3): qty 1

## 2014-08-01 MED ORDER — NEOSTIGMINE METHYLSULFATE 10 MG/10ML IV SOLN
INTRAVENOUS | Status: AC
  Filled 2014-08-01: qty 1

## 2014-08-01 MED ORDER — BUPIVACAINE 0.25 % ON-Q PUMP DUAL CATH 300 ML: 300.0000 mL | INJECTION | Status: DC

## 2014-08-01 MED ORDER — CISATRACURIUM BESYLATE (PF) 10 MG/5ML IV SOLN
INTRAVENOUS | Status: DC | PRN
  Administered 2014-08-01: 4 mg via INTRAVENOUS
  Administered 2014-08-01 (×2): 2 mg via INTRAVENOUS
  Administered 2014-08-01: 10 mg via INTRAVENOUS
  Administered 2014-08-01 (×2): 2 mg via INTRAVENOUS

## 2014-08-01 MED ORDER — MORPHINE SULFATE 2 MG/ML IJ SOLN: 1.0000 mg | INTRAMUSCULAR | Status: DC | PRN

## 2014-08-01 MED ORDER — ONDANSETRON HCL 4 MG/2ML IJ SOLN: 4.0000 mg | Freq: Once | INTRAMUSCULAR | Status: DC | PRN

## 2014-08-01 MED ORDER — BUPIVACAINE-EPINEPHRINE 0.25% -1:200000 IJ SOLN
INTRAMUSCULAR | Status: DC | PRN
  Administered 2014-08-01: 1 mL

## 2014-08-01 MED ORDER — ONDANSETRON HCL 4 MG PO TABS: 4.0000 mg | ORAL_TABLET | Freq: Four times a day (QID) | ORAL | Status: DC | PRN

## 2014-08-01 MED ORDER — LACTATED RINGERS IV SOLN
INTRAVENOUS | Status: DC | PRN
  Administered 2014-08-01: 11:00:00 via INTRAVENOUS

## 2014-08-01 MED ORDER — TISSEEL VH 10 ML EX KIT
PACK | CUTANEOUS | Status: AC
  Filled 2014-08-01: qty 2

## 2014-08-01 MED ORDER — PHENYLEPHRINE 40 MCG/ML (10ML) SYRINGE FOR IV PUSH (FOR BLOOD PRESSURE SUPPORT)
PREFILLED_SYRINGE | INTRAVENOUS | Status: AC
  Filled 2014-08-01: qty 10

## 2014-08-01 MED ORDER — CETYLPYRIDINIUM CHLORIDE 0.05 % MT LIQD
7.0000 mL | Freq: Two times a day (BID) | OROMUCOSAL | Status: DC
  Administered 2014-08-01 – 2014-08-05 (×7): 7 mL via OROMUCOSAL

## 2014-08-01 MED ORDER — THROMBIN 20000 UNITS EX KIT
20000.0000 [IU] | PACK | Freq: Once | CUTANEOUS | Status: DC
  Filled 2014-08-01: qty 1

## 2014-08-01 MED ORDER — NALOXONE HCL 0.4 MG/ML IJ SOLN: 0.4000 mg | INTRAMUSCULAR | Status: DC | PRN

## 2014-08-01 SURGICAL SUPPLY — 107 items
BLADE EXTENDED COATED 6.5IN (ELECTRODE) ×4 IMPLANT
BLADE HEX COATED 2.75 (ELECTRODE) ×4 IMPLANT
BLADE SURG SZ10 CARB STEEL (BLADE) IMPLANT
BOOT SUTURE VASCULAR YLW (MISCELLANEOUS) ×4
CATH KIT ON-Q SILVERSOAK 5IN (CATHETERS) IMPLANT
CATH KIT ON-Q SILVERSOAK 7.5IN (CATHETERS) ×8 IMPLANT
CHLORAPREP W/TINT 26ML (MISCELLANEOUS) ×4 IMPLANT
CLAMP SUTURE YELLOW 5 PAIRS (MISCELLANEOUS) ×4 IMPLANT
CLIP LIGATING HEM O LOK PURPLE (MISCELLANEOUS) ×4 IMPLANT
CLIP LIGATING HEMO O LOK GREEN (MISCELLANEOUS) ×4 IMPLANT
CLIP LIGATING HEMOLOK MED (MISCELLANEOUS) IMPLANT
CLIP TI LARGE 6 (CLIP) ×4 IMPLANT
CLIP TI MEDIUM 6 (CLIP) ×16 IMPLANT
DECANTER SPIKE VIAL GLASS SM (MISCELLANEOUS) ×4 IMPLANT
DISSECTOR ROUND CHERRY 3/8 STR (MISCELLANEOUS) IMPLANT
DRAIN CHANNEL 19F RND (DRAIN) ×4 IMPLANT
DRAPE C-ARM 42X120 X-RAY (DRAPES) IMPLANT
DRAPE CAMERA CLOSED 9X96 (DRAPES) IMPLANT
DRAPE SHEET LG 3/4 BI-LAMINATE (DRAPES) IMPLANT
DRAPE UTILITY XL STRL (DRAPES) IMPLANT
DRAPE WARM FLUID 44X44 (DRAPE) ×8 IMPLANT
DRESSING TELFA ISLAND 4X8 (GAUZE/BANDAGES/DRESSINGS) IMPLANT
DRSG PAD ABDOMINAL 8X10 ST (GAUZE/BANDAGES/DRESSINGS) IMPLANT
DRSG TEGADERM 4X4.75 (GAUZE/BANDAGES/DRESSINGS) ×4 IMPLANT
DRSG TELFA 4X10 ISLAND STR (GAUZE/BANDAGES/DRESSINGS) ×4 IMPLANT
DRSG TELFA PLUS 4X6 ADH ISLAND (GAUZE/BANDAGES/DRESSINGS) ×4 IMPLANT
EVACUATOR SILICONE 100CC (DRAIN) ×4 IMPLANT
GAUZE SPONGE 4X4 12PLY STRL (GAUZE/BANDAGES/DRESSINGS) IMPLANT
GAUZE SPONGE 4X4 16PLY XRAY LF (GAUZE/BANDAGES/DRESSINGS) ×4 IMPLANT
GLOVE BIO SURGEON STRL SZ 6 (GLOVE) ×4 IMPLANT
GLOVE INDICATOR 6.5 STRL GRN (GLOVE) ×8 IMPLANT
GOWN STRL REUS W/ TWL XL LVL3 (GOWN DISPOSABLE) ×6 IMPLANT
GOWN STRL REUS W/TWL 2XL LVL3 (GOWN DISPOSABLE) ×4 IMPLANT
GOWN STRL REUS W/TWL XL LVL3 (GOWN DISPOSABLE) ×6
HEMOSTAT SURGICEL 4X8 (HEMOSTASIS) IMPLANT
KIT BASIN OR (CUSTOM PROCEDURE TRAY) ×4 IMPLANT
LOOP MINI RED (MISCELLANEOUS) IMPLANT
LOOP VESSEL MAXI BLUE (MISCELLANEOUS) ×4 IMPLANT
NEEDLE BIOPSY 14GX4.5 SOFT TIS (NEEDLE) IMPLANT
NEEDLE HYPO 22GX1.5 SAFETY (NEEDLE) ×4 IMPLANT
NS IRRIG 1000ML POUR BTL (IV SOLUTION) ×8 IMPLANT
PACK GENERAL/GYN (CUSTOM PROCEDURE TRAY) ×4 IMPLANT
PACK UNIVERSAL I (CUSTOM PROCEDURE TRAY) ×4 IMPLANT
RELOAD PROXIMATE 75MM BLUE (ENDOMECHANICALS) ×8 IMPLANT
SCISSORS HARMONIC WAVE 18CM (INSTRUMENTS) IMPLANT
SEALANT SURGICAL APPL DUAL CAN (MISCELLANEOUS) ×4 IMPLANT
SEALER BIPOLAR AQUA 6.0 (INSTRUMENTS) IMPLANT
SEALER BIPOLAR AQUA 9.5 XL (INSTRUMENTS) IMPLANT
SET IRRIG TUBING LAPAROSCOPIC (IRRIGATION / IRRIGATOR) IMPLANT
SHEARS FOC LG CVD HARMONIC 17C (MISCELLANEOUS) IMPLANT
SLEEVE SURGEON STRL (DRAPES) IMPLANT
SLEEVE XCEL OPT CAN 5 100 (ENDOMECHANICALS) IMPLANT
SPONGE DRAIN TRACH 4X4 STRL 2S (GAUZE/BANDAGES/DRESSINGS) ×4 IMPLANT
SPONGE LAP 18X18 X RAY DECT (DISPOSABLE) ×8 IMPLANT
SPONGE SURGIFOAM ABS GEL 100 (HEMOSTASIS) IMPLANT
STAPLER PROXIMATE 75MM BLUE (STAPLE) ×4 IMPLANT
STAPLER VISISTAT 35W (STAPLE) IMPLANT
STENT PERCUFLEX 4.8FRX24 (STENTS) ×4 IMPLANT
SUCTION POOLE TIP (SUCTIONS) ×4 IMPLANT
SUT CHROMIC 3 0 SH 27 (SUTURE) IMPLANT
SUT CHROMIC 4 0 RB 1X27 (SUTURE) IMPLANT
SUT ETHILON 2 0 PS N (SUTURE) ×4 IMPLANT
SUT MNCRL AB 4-0 PS2 18 (SUTURE) IMPLANT
SUT PDS AB 1 TP1 54 (SUTURE) IMPLANT
SUT PDS AB 1 TP1 96 (SUTURE) ×12 IMPLANT
SUT PDS AB 3-0 SH 27 (SUTURE) ×16 IMPLANT
SUT PDS AB 4-0 RB1 27 (SUTURE) ×36 IMPLANT
SUT PROLENE 3 0 SH 48 (SUTURE) ×8 IMPLANT
SUT PROLENE 3 0 SH1 36 (SUTURE) ×4 IMPLANT
SUT PROLENE 4 0 RB 1 (SUTURE) ×6
SUT PROLENE 4-0 RB1 .5 CRCL 36 (SUTURE) ×6 IMPLANT
SUT PROLENE 5 0 CC 1 (SUTURE) IMPLANT
SUT SILK 0 FSL (SUTURE) ×4 IMPLANT
SUT SILK 2 0 (SUTURE) ×2
SUT SILK 2 0 SH CR/8 (SUTURE) ×4 IMPLANT
SUT SILK 2-0 18XBRD TIE 12 (SUTURE) ×2 IMPLANT
SUT SILK 3 0 (SUTURE)
SUT SILK 3 0 SH CR/8 (SUTURE) ×8 IMPLANT
SUT SILK 3-0 18XBRD TIE 12 (SUTURE) IMPLANT
SUT VIC AB 2-0 CT1 27 (SUTURE) ×12
SUT VIC AB 2-0 CT1 27XBRD (SUTURE) ×12 IMPLANT
SUT VIC AB 2-0 SH 27 (SUTURE) ×2
SUT VIC AB 2-0 SH 27X BRD (SUTURE) ×2 IMPLANT
SUT VIC AB 3-0 SH 18 (SUTURE) IMPLANT
SUT VIC AB 4-0 SH 18 (SUTURE) IMPLANT
SUT VICRYL 2 0 18  UND BR (SUTURE) ×2
SUT VICRYL 2 0 18 UND BR (SUTURE) ×2 IMPLANT
SUT VICRYL 3 0 BR 18  UND (SUTURE) ×2
SUT VICRYL 3 0 BR 18 UND (SUTURE) ×2 IMPLANT
SYR CONTROL 10ML LL (SYRINGE) ×4 IMPLANT
SYRINGE 10CC LL (SYRINGE) IMPLANT
SYS LAPSCP GELPORT 120MM (MISCELLANEOUS)
SYSTEM LAPSCP GELPORT 120MM (MISCELLANEOUS) IMPLANT
TAG SUTURE CLAMP YLW 5PR (MISCELLANEOUS) ×4
TAPE UMBILICAL COTTON 1/8X30 (MISCELLANEOUS) IMPLANT
TOWEL BLUE STERILE X RAY DET (MISCELLANEOUS) IMPLANT
TOWEL OR 17X26 10 PK STRL BLUE (TOWEL DISPOSABLE) ×16 IMPLANT
TRAY FOLEY CATH 14FRSI W/METER (CATHETERS) ×4 IMPLANT
TROCAR BLADELESS OPT 5 100 (ENDOMECHANICALS) IMPLANT
TROCAR BLADELESS OPT 5 75 (ENDOMECHANICALS) IMPLANT
TROCAR XCEL BLUNT TIP 100MML (ENDOMECHANICALS) IMPLANT
TROCAR XCEL NON-BLD 11X100MML (ENDOMECHANICALS) IMPLANT
TUBE FEEDING 5FR 36IN KANGAROO (TUBING) IMPLANT
TUBE FEEDING 8FR 16IN STR KANG (MISCELLANEOUS) IMPLANT
TUBING INSUFFLATION 10FT LAP (TUBING) IMPLANT
TUNNELER SHEATH ON-Q 16GX12 DP (PAIN MANAGEMENT) ×4 IMPLANT
YANKAUER SUCT BULB TIP NO VENT (SUCTIONS) ×4 IMPLANT

## 2014-08-01 NOTE — H&P (Signed)
Ronnie Nicholson 07/12/2014 9:32 AM Location: [facility] Patient #: [id number] DOB: [birth year] Married / Language: Ronnie Nicholson / Race: White Male  History of Present Illness Ronnie Klein MD; 07/12/2014 10:39 AM) Patient words: gallbladder.  The patient is a 75 year old male who presents with a complaint of gallbladder cancer. Patient is a 74 year old male who underwent a cholecystectomy by Dr. Ninfa Nicholson for chronic cholecystitis. He had a severe gallbladder attack on December 18 and underwent a CT and an ultrasound. The only abnormality seen on that was a slightly thickened gallbladder wall. 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 He is unable to get an MRI due to metal in his inner ear. He did undergo a CEA 19-9 and this was elevated at 78. He has not had any megalies or fatigue. He had a small amount of weight loss that happened after his gallbladder attack, but he has not had any weight loss prior to that. He does have a significant family history for cancer. She had a brother with melanoma, another brother with colon cancer, a sister with breast cancer, and another brother with brain cancer. He feels fine and has no symptoms at all. He did not take any pain medication following his cholecystectomy. He is a very robust 49 year old. His wife saw Dr. Rush Nicholson for breast cancer used to be a patient of Dr. Truddie Nicholson.    Allergies (Ronnie Nicholson, CMA; 07/12/2014 9:33 AM) Lodine *ANALGESICS - ANTI-INFLAMMATORY* Flexeril *MUSCULOSKELETAL THERAPY AGENTS* Tetracycline *CHEMICALS*  Medication History (Ronnie Nicholson, CMA; 07/12/2014 9:33 AM) Aspirin Low Strength (81MG  Tablet Chewable, Oral) Active. Lopressor (50MG  Tablet, Oral) Active.  Review of Systems Ronnie Klein MD;  07/12/2014 10:39 AM) All other systems negative   Vitals (Ronnie Nicholson CMA; 07/12/2014 9:33 AM) 07/12/2014 9:33 AM Weight: 187 lb Height: 73in Body Surface Area: 2.09 m Body Mass Index: 24.67 kg/m Temp.: 97.2F(Temporal)  Pulse: 78 (Regular)  BP: 128/80 (Sitting, Left Arm, Standard)    Physical Exam Ronnie Klein MD; 07/12/2014 10:40 AM) General Mental Status-Alert. General Appearance-Consistent with stated age. Hydration-Well hydrated. Voice-Normal.  Head and Neck Head-normocephalic, atraumatic with no lesions or palpable masses.  Eye Sclera/Conjunctiva - Bilateral-No scleral icterus.  Chest and Lung Exam Chest and lung exam reveals -quiet, even and easy respiratory effort with no use of accessory muscles. Inspection Chest Wall - Normal. Back - normal.  Breast - Did not examine.  Cardiovascular Cardiovascular examination reveals -normal pedal pulses bilaterally. Note: regular rate and rhythm  Abdomen Inspection-Inspection Normal. Palpation/Percussion Palpation and Percussion of the abdomen reveal - Soft, Non Tender, No Rebound tenderness, No Rigidity (guarding) and No hepatosplenomegaly. Auscultation Auscultation of the abdomen reveals - Bowel sounds normal. Note: Laparoscopic cholecystectomy scars are healing well. The abdomen is soft, nontender, and nondistended.   Peripheral Vascular Upper Extremity Inspection - Bilateral - Normal - No Clubbing, No Cyanosis, No Edema, Pulses Intact. Lower Extremity Palpation - Edema - Bilateral - No edema.  Neurologic Neurologic evaluation reveals -alert and oriented x 3 with no impairment of recent or remote memory. Mental Status-Normal.  Musculoskeletal Global Assessment -Note: no gross deformities.  Normal Exam - Left-Upper Extremity Strength Normal and Lower Extremity Strength Normal. Normal Exam - Right-Upper Extremity Strength Normal and Lower Extremity Strength  Normal.  Lymphatic Head & Neck  General Head &  Neck Lymphatics: Bilateral - Description - Normal. Axillary  General Axillary Region: Bilateral - Description - Normal. Tenderness - Non Tender.    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 We discussed that he would need to at least have a partial hepatectomy and bile duct reconstruction to achieve negative margins. He may require a formal right hepatectomy. Since he cannot get an MRI, I have discussed with radiology the best options. They recommended a triple phase CT since we are mainly looking for smaller volume disease. I also ordered a chest CT. We are making a referral to oncology and also to genetics. I will schedule him at the first available opportunity as long as he does not have any evidence of left-sided disease in the liver or metastatic disease.  I discussed the risk of bleeding, infection, damage to adjacent structures, possible need for additional procedures, heart or lung issues, and blood clots. He wants to proceed at the first available opportunity.  The evaluation, examination, counseling, and inclination of care to approximately 40 minutes. Greater than 50% of this spent counseling. Current Plans  Referred to Oncology, for evaluation and follow up (Oncology). Referred to Genetic Counseling, for evaluation and follow up [employer]). Whatever the operative findings, I will discuss the case with your family after we are done in the operating room. I will talk to you in the next few days when you are more awake. I will see you in the hospital every weekday that I am not out of town. My partners help see patients on the weekends and if I am out of town.  WHAT HAPPENS AFTER SURGERY: After surgery, you will go first to the recovery room, then to the ICU for careful monitoring. It is  important that I am able to know your vital signs and urine output to see how you are doing. Depending on many factors, you may be in the ICU overnight, or for several days. You will have many tubes and lines in place which is standard for this operation. You will have several IVs, including possibly a central line in your chest or neck. You will likely have an arterial line in your wrist. You will have a tube in your nose for 4-5 days in order to suction out the stomach and liver secretions to help the surgical connections heal. YOU WILL NOT BE ABLE TO EAT FOR AT LEAST 4-5 DAYS AFTER SURGERY. You will have a catheter in your bladder. On your abdomen, you will have several surgical drains and possibly a pain pump with numbing medicine. You will have compression stockings on your legs to decrease the risk of blood clots.  Sometimes anesthesia makes a decision that you may need to be left on the breathing machine for a period after surgery. This may be based on your overall health status, or events during surgery.  We will address your pain in several ways. We will use an IV pain pump called a "PCA," or Patient Controlled Analgesia. This allows you to press a button and immediately receive a dose of pain medication without waiting for a nurse. We also use IV Tylenol and sometimes IV Toradol which is similar to ibuprofen. You may also have a pump with numbing medicine delivered directly to your incision. I use doses and medications that work for the majority of people, but you may need an  adjustment to the dose or type of medicine if your pain is not adequately controlled. Your throat may be sore, in which case you may need a throat spray or lozenges.  We will ask you to get out of bed the day after surgery in order to maximize your chances of not having complications. Your risk of pneumonia and blood clots is lower with walking and sitting in the chair. We will also ask you to perform breathing exercises. We  will also ask to you walk in your room and in the halls for the above reasons, but also in order for you to keep up your strength.  EATING: We will usually start you on clear liquids in around 2 days if your bowel function seems to have returned. We advance your diet slowly to make sure you are tolerating each step. All patients do not have a normal appetite when they go home and usually have to take 2-4 cans of nutritional supplement per day while this is improving. Most patients also find that their taste buds do not seem the same right after surgery, and this can continue into the time of possible post operative chemotherapy and radiation. Some patients develop diabetes and will need assistance from a primary care doctor for medication.   GOING HOME! Usually you are able to go home in 5-10 days, depending on whether or not complications happen and what is going on with your overall health status. If you have more health problems or if you have limited help at home, the therapists and nurses may recommend a temporary rehab or nursing facility to help you get back on your feet before you go home. These decisions would be made while you are in the hospital with the assistance of a social worker or case manager.  Please bring all insurance/disability forms to our office for the staff to fill out   POSSIBLE COMPLICATIONS This is a very extensive operation and includes complications listed below: Bleeding Infection and possible wound complications such as hernia Damage to adjacent structures Leak of bile from the surface of the liver Possible need for other procedures, such as abscess drains in radiology or endoscopy. Possible prolonged hospital stay MOST PATIENTS' ENERGY LEVEL IS NOT BACK TO NORMAL FOR AT LEAST 4-6 MONTHS. OLDER PATIENTS MAY FEEL WEAK FOR LONGER PERIODS OF TIME. Difficulty with eating or post operative nausea (around 30%) Possible early recurrence of cancer Possible  complications of your medical problems such as heart disease or arrhythmias. Death (less than 2%)  All possible complications are not listed, just the most common.  FURTHER INFORMATION? Please ask questions if you find something that we did not discuss in the office and would like more information. If you would like another appointment if you have many questions or if your family members would like to come as well, please contact the office.  IF YOU ARE TAKING ASPIRIN, PLAVIX, COUMADIN, OR OTHER BLOOD THINNERS, LET us KNOW IMMEDIATELY SO WE CAN CONTACT YOUR PRESCRIBING HEALTH CARE PROVIDER TO HOLD THE MEDICATION FOR 5-7 DAYS BEFORE SURGERY    Signed by Ronnie Klein, MD (07/12/2014 10:43 AM)

## 2014-08-01 NOTE — Anesthesia Postprocedure Evaluation (Signed)
  Anesthesia Post-op Note  Patient: Ronnie Nicholson  Procedure(s) Performed: Procedure(s): OPEN PARTIAL HEPATECTOMY, INTRA- OPERATIVE ULTRASOUND, COMMON BILE DUCT RESECTION, ROUX-EN-Y HEPATICOJEJUNOSTOMY  Patient Location: PACU  Anesthesia Type: General  Level of Consciousness: awake and alert   Airway and Oxygen Therapy: Patient Spontanous Breathing  Post-op Pain: mild  Post-op Assessment: Post-op Vital signs reviewed, Patient's Cardiovascular Status Stable, Respiratory Function Stable, Patent Airway and No signs of Nausea or vomiting  Last Vitals:  Filed Vitals:   08/01/14 1657  BP: 168/64  Pulse: 83  Temp: 36.7 C  Resp: 14    Post-op Vital Signs: stable   Complications: No apparent anesthesia complications

## 2014-08-01 NOTE — Anesthesia Procedure Notes (Signed)
Procedure Name: Intubation Date/Time: 08/01/2014 11:54 AM Performed by: Glory Buff Pre-anesthesia Checklist: Patient identified, Emergency Drugs available, Suction available and Patient being monitored Patient Re-evaluated:Patient Re-evaluated prior to inductionOxygen Delivery Method: Circle System Utilized Preoxygenation: Pre-oxygenation with 100% oxygen Intubation Type: IV induction Ventilation: Two handed mask ventilation required Laryngoscope Size: Mac and 4 Grade View: Grade III Tube type: Oral Number of attempts: 1 Airway Equipment and Method: Oral airway and Bougie stylet Placement Confirmation: ETT inserted through vocal cords under direct vision,  positive ETCO2 and breath sounds checked- equal and bilateral Secured at: 22 cm Tube secured with: Tape Dental Injury: Teeth and Oropharynx as per pre-operative assessment

## 2014-08-01 NOTE — Op Note (Signed)
PREOPERATIVE DIAGNOSIS:  pT3N0M0 gallbladder cancer  POSTOPERATIVE DIAGNOSIS:  same  PROCEDURE PERFORMED:  Intraoperative liver ultrasound, partial right hepatic lobectomy, common bile duct resection, roux en y hepaticojejunostomy  Indication:  Incidentally discovered gallbladder cancer on lap chole for chronic calculous cholecystitis, positive cystic duct margin, positive liver margin.  SURGEON:  [name], MD  ASSISTANT:  Nedra Hai, MD  ANESTHESIA:  General and local.  FINDINGS:  No gross disease, frozen section margins negative.  Marland Kitchen  SPECIMEN:  Partial segment 5 resection, portal nodes, common hepatic/common bile duct with cystic duct stump  ESTIMATED BLOOD LOSS:  150 mL.  COMPLICATIONS:  Unknown.  PROCEDURE:  Patient was identified in the holding area and taken to the operating room where he was placed supine on the operating room table.  General endotracheal anesthesia was induced.  His abdomen was prepped and draped in a sterile fashion.  A time-out was performed according to the surgical safety check list.  When all was correct, we continued.    A right subcostal incision was made with a #10 blade, and extended up the midline.  The cautery was used to divide the subcutaneous tissues.  The fascia was opened with the cautery and the muscle as well.  The muscle was cauterized with the Argon Beam coagulator as well.  The falciform was divided with the harmonic scalpel.    The Bookwalter was then placed for assistance with visualization.  The omental adhesions were taken off of the bladder fossa. The portion that was adherent to the liver was left in place. The porta hepatis was then exposed.  Once the porta hepatis was exposed, the liver was retracted gently with the sweetheart retractor.  The gastrohepatic ligament was opened.  There was no sign of any replaced left hepatic artery or replaced right hepatic artery.  The common bile duct was skeletonized. The common hepatic  duct was also skeletonized. The lymph nodes were taken down off of the porta as well. The common hepatic artery was identified. The Doppler was used to confirm identification of the structures in the porta.  The ultrasound was then used to evaluate the liver. There was no evidence of lesions that were concerning. I also used this to evaluate the distance from the surface of liver to the right hepatic duct and the right branch of the portal vein. The cautery was used to score the liver approximately 2 cm from the edge. Stay sutures were placed on either side of the area marked for division. The harmonic scalpel was used to divide the liver parenchyma. There were several small vessels entering the tissue and these were clipped. There was a small defect that was oversewn with 4-0 Prolene. The portion of segment 5 was taken down almost to the bifurcation. This was completely resected with the harmonic and the margins were marked. The common bile duct was then resected right at the duodenum. Around a centimeter and a half of the common hepatic duct was taken proximal to the insertion of the cystic duct. These were suture ligated and clipped at the duodenum. A bulldog was used to minimize any biliary leakage into the peritoneum. The liver and bile ducts specimens were sent for frozen section.  The ligament of Treitz is in identified underneath the colonic mesentery. Proximally 30 cm distal to the ligament of Treitz the small bowel was divided. Both ends of the small bowel were oversewn with 3-0 PDS suture. A portion of the mesentery was opened up. The Roux limb  was then passed just to the right of the middle colic artery in the mesocolon. Adequate length was held in place. The mesenteric defect was closed between the mesocolon and the Roux limb such that there could not be an internal hernia in this location. The 2 segments of small bowel were then anastomosed in an side to side fashion. The peristaltic direction was  maintained for both limbs. A stay suture was placed at the apex on either side. This mesenteric defect was also closed to decrease the risk of volvulus or internal hernia.  The frozen sections returned with negative margins. The biliary anastomosis was then created with 4-0 PDS sutures over a 4.8 [place] biliary stent. The bile duct was only around 3-4 mm. The anterior portion was spatulated. Eight 4-0 interrupted PDS sutures were used to perform the anastomosis. 3-0 Vicryl was used to help secure the limb to the mesentery into the liver edge as well as the retroperitoneum in order to minimize the risk of volvulus.  The abdomen was then irrigated. Tisseel was placed over the anastomosis and along the cut edge of the liver. A 19 [place] Blake drain was placed anterior to this and out the right lower quadrant. This was secured with 2-0 nylon. On-Q tunnelers were placed on either side of the skin incision and the preperitoneal space. The incision was then closed using 2 layers of running #1 looped PDS suture. The skin was irrigated and closed with skin staples. The skin was cleaned, dried, and dressed with dry sterile dressings. Needle, sponge, and instrument counts were correct 2. The patient was allowed to emerge from anesthesia and taken to the PACU in stable condition.[name], MD

## 2014-08-01 NOTE — Transfer of Care (Signed)
Immediate Anesthesia Transfer of Care Note  Patient: Ronnie Nicholson  Procedure(s) Performed: Procedure(s): OPEN PARTIAL HEPATECTOMY, INTRA- OPERATIVE ULTRASOUND, COMMON BILE DUCT RESECTION, ROUX-EN-Y HEPATICOJEJUNOSTOMY  Patient Location: PACU  Anesthesia Type:General  Level of Consciousness: awake, alert  and oriented  Airway & Oxygen Therapy: Patient Spontanous Breathing and Patient connected to face mask oxygen  Post-op Assessment: Report given to RN and Post -op Vital signs reviewed and stable  Post vital signs: Reviewed and stable  Last Vitals:  Filed Vitals:   08/01/14 0955  BP: 155/69  Pulse: 71  Temp: 36.3 C  Resp: 18    Complications: No apparent anesthesia complications

## 2014-08-02 ENCOUNTER — Encounter (HOSPITAL_COMMUNITY): Payer: Self-pay | Admitting: General Surgery

## 2014-08-02 LAB — COMPREHENSIVE METABOLIC PANEL
ALBUMIN: 3.4 g/dL — AB (ref 3.5–5.2)
ALK PHOS: 75 U/L (ref 39–117)
ALT: 193 U/L — ABNORMAL HIGH (ref 0–53)
ANION GAP: 7 (ref 5–15)
AST: 220 U/L — AB (ref 0–37)
BUN: 14 mg/dL (ref 6–23)
CHLORIDE: 103 mmol/L (ref 96–112)
CO2: 27 mmol/L (ref 19–32)
Calcium: 8.3 mg/dL — ABNORMAL LOW (ref 8.4–10.5)
Creatinine, Ser: 0.9 mg/dL (ref 0.50–1.35)
GFR calc Af Amer: >90 mL/min (ref >90)
GFR calc non Af Amer: 81 mL/min — ABNORMAL LOW (ref >90)
Glucose, Bld: 142 mg/dL — ABNORMAL HIGH (ref 70–99)
POTASSIUM: 4 mmol/L (ref 3.5–5.1)
Sodium: 137 mmol/L (ref 135–145)
Total Bilirubin: 1.3 mg/dL — ABNORMAL HIGH (ref 0.3–1.2)
Total Protein: 5.9 g/dL — ABNORMAL LOW (ref 6.0–8.3)

## 2014-08-02 LAB — CBC
HEMATOCRIT: 34.8 % — AB (ref 39.0–52.0)
HEMOGLOBIN: 11.3 g/dL — AB (ref 13.0–17.0)
MCH: 29.5 pg (ref 26.0–34.0)
MCHC: 32.5 g/dL (ref 30.0–36.0)
MCV: 90.9 fL (ref 78.0–100.0)
Platelets: 195 10*3/uL (ref 150–400)
RBC: 3.83 MIL/uL — ABNORMAL LOW (ref 4.22–5.81)
RDW: 12.8 % (ref 11.5–15.5)
WBC: 12.2 10*3/uL — ABNORMAL HIGH (ref 4.0–10.5)

## 2014-08-02 LAB — PROTIME-INR
INR: 1.09 (ref 0.00–1.49)
PROTHROMBIN TIME: 14.2 s (ref 11.6–15.2)

## 2014-08-02 LAB — PHOSPHORUS: Phosphorus: 2.9 mg/dL (ref 2.3–4.6)

## 2014-08-02 LAB — MAGNESIUM: Magnesium: 1.7 mg/dL (ref 1.5–2.5)

## 2014-08-02 NOTE — Care Management Note (Signed)
CARE MANAGEMENT NOTE 08/02/2014  Patient:  Ronnie Nicholson, Ronnie Nicholson   Account Number:  [id number]  Date Initiated:  08/02/2014  Documentation initiated by:  DAVIS,RHONDA  Subjective/Objective Assessment:   OPEN PARTIAL HEPATECTOMY, INTRA- OPERATIVE ULTRASOUND, COMMON BILE DUCT RESECTION, ROUX-EN-Y HEPATICOJEJUNOSTOMY  PAS     Action/Plan:   home when stable   Anticipated DC Date:  08/05/2014   Anticipated DC Plan:  HOME/SELF CARE  In-house referral  NA      DC Planning Services  CM consult      PAC Choice  NA   Choice offered to / List presented to:  NA   DME arranged  NA      DME agency  NA     [place] arranged  NA      [place] agency  NA   Status of service:  In process, will continue to follow Medicare Important Message given?   (If response is "NO", the following Medicare IM given date fields will be blank) Date Medicare IM given:   Medicare IM given by:   Date Additional Medicare IM given:   Additional Medicare IM given by:    Discharge Disposition:    Per UR Regulation:  Reviewed for med. necessity/level of care/duration of stay  If discussed at [place] of Stay Meetings, dates discussed:    Comments:  Feb. 26 2016/Rhonda L. Rosana Hoes, RN, BSN, CCM. Case Management [place] [phone] No discharge needs present of time of review.

## 2014-08-02 NOTE — Progress Notes (Signed)
1 Day Post-Op  Subjective: C/o back discomfort, but minimal pain in abdomen.  No n/v.  Some belching.    Objective: Vital signs in last 24 hours: Temp:  [97.3 F (36.3 C)-98.5 F (36.9 C)] 98.5 F (36.9 C) (02/26 0403) Pulse Rate:  [71-91] 83 (02/25 1657) Resp:  [11-21] 15 (02/26 0700) BP: (155-179)/(52-85) 179/52 mmHg (02/25 1700) SpO2:  [93 %-100 %] 94 % (02/26 0700) Arterial Line BP: (62-147)/(40-67) 118/40 mmHg (02/26 0700) Weight:  [185 lb 6 oz (84.086 kg)-186 lb 4.6 oz (84.5 kg)] 186 lb 4.6 oz (84.5 kg) (02/25 1700) Last BM Date: 07/31/14  Intake/Output from previous day: 02/25 0701 - 02/26 0700 In: 3990 [I.V.:3800; IV Piggyback:150] Out: 1240 [Urine:875; Drains:215; Blood:150] Intake/Output this shift:    General appearance: alert, cooperative and no distress Resp: breathing comfortably Cardio: regular rate and rhythm GI: soft, approp tender, dressing with some serosanguinous staining.    Lab Results:   Recent Labs  08/01/14 1730 08/02/14 0545  WBC 14.0* 12.2*  HGB 11.6* 11.3*  HCT 35.5* 34.8*  PLT 192 195   BMET  Recent Labs  08/01/14 1730 08/02/14 0545  NA  --  137  K  --  4.0  CL  --  103  CO2  --  27  GLUCOSE  --  142*  BUN  --  14  CREATININE 0.95 0.90  CALCIUM  --  8.3*   PT/INR  Recent Labs  08/01/14 1030 08/02/14 0545  LABPROT 14.0 14.2  INR 1.06 1.09   ABG No results for input(s): PHART, HCO3 in the last 72 hours.  Invalid input(s): PCO2, PO2  Studies/Results: No results found.  Anti-infectives: Anti-infectives    Start     Dose/Rate Route Frequency Ordered Stop   08/01/14 1800  cefOXitin (MEFOXIN) 1 g in dextrose 5 % 50 mL IVPB     1 g 100 mL/hr over 30 Minutes Intravenous Every 6 hours 08/01/14 1706 08/02/14 0559   08/01/14 0958  cefOXitin (MEFOXIN) 2 g in dextrose 5 % 50 mL IVPB     2 g 100 mL/hr over 30 Minutes Intravenous On call to O.R. 08/01/14 0958 08/01/14 1358      Assessment/Plan: s/p Procedure(s): OPEN  PARTIAL HEPATECTOMY, INTRA- OPERATIVE ULTRASOUND, COMMON BILE DUCT RESECTION, ROUX-EN-Y HEPATICOJEJUNOSTOMY PAS Continue foley due to urinary output monitoring Ambulate, pulmonary toilet.  Metoprolol for HTN Recheck LFTs for tomorrow.  Anticipate LFTs to rise and then fall.   Pca/onq for pain control.     LOS: 1 day    [facility] 08/02/2014

## 2014-08-03 LAB — CBC
HCT: 36.2 % — ABNORMAL LOW (ref 39.0–52.0)
HEMOGLOBIN: 11.9 g/dL — AB (ref 13.0–17.0)
MCH: 29.8 pg (ref 26.0–34.0)
MCHC: 32.9 g/dL (ref 30.0–36.0)
MCV: 90.5 fL (ref 78.0–100.0)
PLATELETS: 197 10*3/uL (ref 150–400)
RBC: 4 MIL/uL — ABNORMAL LOW (ref 4.22–5.81)
RDW: 12.7 % (ref 11.5–15.5)
WBC: 13.3 10*3/uL — AB (ref 4.0–10.5)

## 2014-08-03 LAB — COMPREHENSIVE METABOLIC PANEL
ALT: 187 U/L — ABNORMAL HIGH (ref 0–53)
AST: 135 U/L — ABNORMAL HIGH (ref 0–37)
Albumin: 3.4 g/dL — ABNORMAL LOW (ref 3.5–5.2)
Alkaline Phosphatase: 77 U/L (ref 39–117)
Anion gap: 7 (ref 5–15)
BILIRUBIN TOTAL: 1.2 mg/dL (ref 0.3–1.2)
BUN: 8 mg/dL (ref 6–23)
CO2: 27 mmol/L (ref 19–32)
CREATININE: 0.74 mg/dL (ref 0.50–1.35)
Calcium: 8.5 mg/dL (ref 8.4–10.5)
Chloride: 101 mmol/L (ref 96–112)
GFR calc Af Amer: >90 mL/min (ref >90)
GFR, EST NON AFRICAN AMERICAN: 88 mL/min — AB (ref >90)
GLUCOSE: 132 mg/dL — AB (ref 70–99)
POTASSIUM: 3.9 mmol/L (ref 3.5–5.1)
Sodium: 135 mmol/L (ref 135–145)
Total Protein: 6.1 g/dL (ref 6.0–8.3)

## 2014-08-03 MED ORDER — MORPHINE SULFATE 2 MG/ML IJ SOLN: 2.0000 mg | INTRAMUSCULAR | Status: DC | PRN

## 2014-08-03 NOTE — Progress Notes (Signed)
Patient ID: Ronnie Nicholson, male   DOB: 1940/08/29, 74 y.o.   MRN: [id number] 2 Days Post-Op  Subjective: Denies pain. No nausea. Up in chair. Not using PCA but alarms are keeping him up.  Objective: Vital signs in last 24 hours: Temp:  [97.9 F (36.6 C)-98.9 F (37.2 C)] 98 F (36.7 C) (02/27 1005) Pulse Rate:  [69-91] 69 (02/27 1005) Resp:  [12-24] 12 (02/27 1005) BP: (142-171)/(56-75) 145/64 mmHg (02/27 1005) SpO2:  [94 %-100 %] 99 % (02/27 1005) Last BM Date: 07/31/14  Intake/Output from previous day: 02/26 0701 - 02/27 0700 In: 2300 [I.V.:2300] Out: 3325 [Urine:3200; Drains:125] Intake/Output this shift: Total I/O In: -  Out: 300 [Urine:300]  General appearance: alert, cooperative and no distress Resp: no wheezing or increased work of breathing GI: normal findings: soft, non-tender and nondistended. JP drainage serosanguineous Incision/Wound: dressing clean and dry  Lab Results:   Recent Labs  08/02/14 0545 08/03/14 0503  WBC 12.2* 13.3*  HGB 11.3* 11.9*  HCT 34.8* 36.2*  PLT 195 197   BMET  Recent Labs  08/02/14 0545 08/03/14 0503  NA 137 135  K 4.0 3.9  CL 103 101  CO2 27 27  GLUCOSE 142* 132*  BUN 14 8  CREATININE 0.90 0.74  CALCIUM 8.3* 8.5   Hepatic Function Latest Ref Rng 08/03/2014 08/02/2014 08/01/2014  Total Protein 6.0 - 8.3 g/dL 6.1 5.9(L) 7.4  Albumin 3.5 - 5.2 g/dL 3.4(L) 3.4(L) 4.3  AST 0 - 37 U/L 135(H) 220(H) 24  ALT 0 - 53 U/L 187(H) 193(H) 18  Alk Phosphatase 39 - 117 U/L 77 75 85  Total Bilirubin 0.3 - 1.2 mg/dL 1.2 1.3(H) 1.4(H)  Bilirubin, Direct 0.0 - 0.5 mg/dL - - 0.2     Studies/Results: No results found.  Anti-infectives: Anti-infectives    Start     Dose/Rate Route Frequency Ordered Stop   08/01/14 1800  cefOXitin (MEFOXIN) 1 g in dextrose 5 % 50 mL IVPB     1 g 100 mL/hr over 30 Minutes Intravenous Every 6 hours 08/01/14 1706 08/02/14 0559   08/01/14 0958  cefOXitin (MEFOXIN) 2 g in dextrose 5 % 50 mL IVPB      2 g 100 mL/hr over 30 Minutes Intravenous On call to O.R. 08/01/14 0958 08/01/14 1358      Assessment/Plan: s/p Procedure(s): OPEN PARTIAL HEPATECTOMY, INTRA- OPERATIVE ULTRASOUND, COMMON BILE DUCT RESECTION, ROUX-EN-Y HEPATICOJEJUNOSTOMY Doing well without apparent complication. DC PCA as alarms are keeping him up and not really using. Clear liquid diet.   LOS: 2 days    Truly Stankiewicz T 08/03/2014

## 2014-08-04 LAB — COMPREHENSIVE METABOLIC PANEL
ALT: 131 U/L — ABNORMAL HIGH (ref 0–53)
ANION GAP: 7 (ref 5–15)
AST: 58 U/L — ABNORMAL HIGH (ref 0–37)
Albumin: 3.2 g/dL — ABNORMAL LOW (ref 3.5–5.2)
Alkaline Phosphatase: 82 U/L (ref 39–117)
BUN: 8 mg/dL (ref 6–23)
CALCIUM: 8.7 mg/dL (ref 8.4–10.5)
CHLORIDE: 104 mmol/L (ref 96–112)
CO2: 27 mmol/L (ref 19–32)
CREATININE: 0.71 mg/dL (ref 0.50–1.35)
GFR calc Af Amer: >90 mL/min (ref >90)
GFR, EST NON AFRICAN AMERICAN: 90 mL/min — AB (ref >90)
GLUCOSE: 106 mg/dL — AB (ref 70–99)
POTASSIUM: 3.8 mmol/L (ref 3.5–5.1)
Sodium: 138 mmol/L (ref 135–145)
TOTAL PROTEIN: 6.3 g/dL (ref 6.0–8.3)
Total Bilirubin: 0.8 mg/dL (ref 0.3–1.2)

## 2014-08-04 LAB — CBC
HCT: 36.8 % — ABNORMAL LOW (ref 39.0–52.0)
Hemoglobin: 12 g/dL — ABNORMAL LOW (ref 13.0–17.0)
MCH: 30 pg (ref 26.0–34.0)
MCHC: 32.6 g/dL (ref 30.0–36.0)
MCV: 92 fL (ref 78.0–100.0)
PLATELETS: 206 10*3/uL (ref 150–400)
RBC: 4 MIL/uL — ABNORMAL LOW (ref 4.22–5.81)
RDW: 12.8 % (ref 11.5–15.5)
WBC: 11.9 10*3/uL — ABNORMAL HIGH (ref 4.0–10.5)

## 2014-08-04 MED ORDER — LIP MEDEX EX OINT
1.0000 "application " | TOPICAL_OINTMENT | Freq: Two times a day (BID) | CUTANEOUS | Status: DC
  Administered 2014-08-04 – 2014-08-06 (×5): 1 via TOPICAL
  Filled 2014-08-04: qty 7

## 2014-08-04 MED ORDER — PHENOL 1.4 % MT LIQD
2.0000 | OROMUCOSAL | Status: DC | PRN
  Filled 2014-08-04: qty 177

## 2014-08-04 MED ORDER — ASPIRIN EC 81 MG PO TBEC
81.0000 mg | DELAYED_RELEASE_TABLET | Freq: Every day | ORAL | Status: DC
  Administered 2014-08-04 – 2014-08-06 (×3): 81 mg via ORAL
  Filled 2014-08-04 (×3): qty 1

## 2014-08-04 MED ORDER — SODIUM CHLORIDE 0.9 % IV SOLN: 250.0000 mL | INTRAVENOUS | Status: DC | PRN

## 2014-08-04 MED ORDER — METOPROLOL TARTRATE 1 MG/ML IV SOLN
5.0000 mg | Freq: Four times a day (QID) | INTRAVENOUS | Status: DC | PRN
  Filled 2014-08-04: qty 5

## 2014-08-04 MED ORDER — ACETAMINOPHEN 500 MG PO TABS
1000.0000 mg | ORAL_TABLET | Freq: Three times a day (TID) | ORAL | Status: DC
  Administered 2014-08-04 – 2014-08-06 (×7): 1000 mg via ORAL
  Filled 2014-08-04 (×8): qty 2

## 2014-08-04 MED ORDER — PROMETHAZINE HCL 25 MG/ML IJ SOLN: 6.2500 mg | INTRAMUSCULAR | Status: DC | PRN

## 2014-08-04 MED ORDER — OXYCODONE HCL 5 MG PO TABS: 5.0000 mg | ORAL_TABLET | ORAL | Status: DC | PRN

## 2014-08-04 MED ORDER — METOPROLOL TARTRATE 12.5 MG HALF TABLET
12.5000 mg | ORAL_TABLET | Freq: Two times a day (BID) | ORAL | Status: DC | PRN
  Filled 2014-08-04: qty 1

## 2014-08-04 MED ORDER — SACCHAROMYCES BOULARDII 250 MG PO CAPS
250.0000 mg | ORAL_CAPSULE | Freq: Two times a day (BID) | ORAL | Status: DC
Start: 2014-08-04 — End: 2014-08-06
  Administered 2014-08-04 – 2014-08-06 (×5): 250 mg via ORAL
  Filled 2014-08-04 (×7): qty 1

## 2014-08-04 MED ORDER — SODIUM CHLORIDE 0.9 % IJ SOLN
3.0000 mL | Freq: Two times a day (BID) | INTRAMUSCULAR | Status: DC
  Administered 2014-08-04 – 2014-08-05 (×3): 3 mL via INTRAVENOUS

## 2014-08-04 MED ORDER — DIPHENHYDRAMINE HCL 50 MG/ML IJ SOLN: 12.5000 mg | Freq: Four times a day (QID) | INTRAMUSCULAR | Status: DC | PRN

## 2014-08-04 MED ORDER — MENTHOL 3 MG MT LOZG
1.0000 | LOZENGE | OROMUCOSAL | Status: DC | PRN
  Filled 2014-08-04: qty 9

## 2014-08-04 MED ORDER — METOPROLOL TARTRATE 25 MG PO TABS
25.0000 mg | ORAL_TABLET | Freq: Two times a day (BID) | ORAL | Status: DC
  Administered 2014-08-04 – 2014-08-06 (×5): 25 mg via ORAL
  Filled 2014-08-04 (×6): qty 1

## 2014-08-04 MED ORDER — ALUM & MAG HYDROXIDE-SIMETH 200-200-20 MG/5ML PO SUSP: 30.0000 mL | Freq: Four times a day (QID) | ORAL | Status: DC | PRN

## 2014-08-04 MED ORDER — MAGIC MOUTHWASH
15.0000 mL | Freq: Four times a day (QID) | ORAL | Status: DC | PRN
  Administered 2014-08-05 (×2): 15 mL via ORAL
  Filled 2014-08-04 (×3): qty 15

## 2014-08-04 MED ORDER — SODIUM CHLORIDE 0.9 % IJ SOLN: 3.0000 mL | INTRAMUSCULAR | Status: DC | PRN

## 2014-08-04 MED ORDER — LACTATED RINGERS IV BOLUS (SEPSIS): 1000.0000 mL | Freq: Three times a day (TID) | INTRAVENOUS | Status: AC | PRN

## 2014-08-04 MED ORDER — BISACODYL 10 MG RE SUPP: 10.0000 mg | Freq: Two times a day (BID) | RECTAL | Status: DC | PRN

## 2014-08-04 NOTE — Progress Notes (Signed)
[facility]  [address] [place], [place] [address] Phone: [phone] FAX: [place] [phone] March 05, 1941  CARE TEAM:  PCP: Rubbie Battiest, MD  Outpatient Care Team: Patient Care Team: Mikey Kirschner, MD as PCP - General (Family Medicine)  Inpatient Treatment Team: Treatment Team: Attending Provider: [name], MD; Registered Nurse: July Dizon Pricilla Holm, RN  Problem List:   Active Problems:   Gallbladder cancer   3 Days Post-Op  Procedure(s): PREOPERATIVE DIAGNOSIS: pT3N0M0 gallbladder cancer  POSTOPERATIVE DIAGNOSIS: same  PROCEDURE PERFORMED: Intraoperative liver ultrasound, partial right hepatic lobectomy, common bile duct resection, roux en y hepaticojejunostomy  Indication: Incidentally discovered gallbladder cancer on lap chole for chronic calculous cholecystitis, positive cystic duct margin, positive liver margin.  SURGEON: [name], MD  ASSISTANT: Nedra Hai, MD   Assessment  Improving  Plan:  -adv full liquids -stop IVF - change to PRN -improve HTN control -f/u pathology -VTE prophylaxis- SCDs, etc -mobilize as tolerated to help recovery  I updated the patient's status to the patient& his wife.  Recommendations were made.  Questions were answered.  They expressed understanding & appreciation.   Adin Hector, M.D., F.A.C.S. Gastrointestinal and Minimally Invasive Surgery [facility] [address] [place], [place] [address] [phone] Main / Paging   08/04/2014  Subjective:  Tol liquids - wants more Denies much pain Wife in room - many questions  Objective:  Vital signs:  Filed Vitals:   08/03/14 1005 08/03/14 1455 08/03/14 2154 08/04/14 0605  BP: 145/64 152/64 145/68 149/68  Pulse: 69 78 74 77  Temp: 98 F (36.7 C) 97.7 F (36.5 C) 98.5 F (36.9 C) 98.5 F (36.9 C)  TempSrc: Oral Oral Oral Oral  Resp:  12 12 14 14   Height:      Weight:      SpO2: 99% 98% 98% 99%    Last BM Date: 07/31/14  Intake/Output   Yesterday:  02/27 0701 - 02/28 0700 In: 7893 [P.O.:1260; I.V.:2100] Out: 2600 [Urine:2550; Drains:50] This shift:  Total I/O In: 360 [P.O.:360] Out: 300 [Urine:300]  Bowel function:  Flatus: y  BM: n  Drain: serosanguinous  Physical Exam:  General: Pt awake/alert/oriented x4 in no acute distress Eyes: PERRL, normal EOM.  Sclera clear.  No icterus Neuro: CN II-XII intact w/o focal sensory/motor deficits. Lymph: No head/neck/groin lymphadenopathy Psych:  No delerium/psychosis/paranoia HENT: Normocephalic, Mucus membranes moist.  No thrush.  HOH Neck: Supple, No tracheal deviation Chest:  No chest wall pain w good excursion CV:  Pulses intact.  Regular rhythm MS: Normal AROM mjr joints.  No obvious deformity Abdomen: Soft.  Nondistended.  Mildly tender at incisions only.  No evidence of peritonitis.  No incarcerated hernias. OnQ collapsed - dressing removed.  Incision c/d/i Ext:  SCDs BLE.  No mjr edema.  No cyanosis Skin: No petechiae / purpura  Results:   Labs: Results for orders placed or performed during the hospital encounter of 08/01/14 (from the past 48 hour(s))  CBC     Status: Abnormal   Collection Time: 08/03/14  5:03 AM  Result Value Ref Range   WBC 13.3 (H) 4.0 - 10.5 K/uL   RBC 4.00 (L) 4.22 - 5.81 MIL/uL   Hemoglobin 11.9 (L) 13.0 - 17.0 g/dL   HCT 36.2 (L) 39.0 - 52.0 %   MCV 90.5 78.0 - 100.0 fL   MCH 29.8 26.0 - 34.0 pg   MCHC 32.9 30.0 -  36.0 g/dL   RDW 12.7 11.5 - 15.5 %   Platelets 197 150 - 400 K/uL  Comprehensive metabolic panel     Status: Abnormal   Collection Time: 08/03/14  5:03 AM  Result Value Ref Range   Sodium 135 135 - 145 mmol/L   Potassium 3.9 3.5 - 5.1 mmol/L   Chloride 101 96 - 112 mmol/L   CO2 27 19 - 32 mmol/L   Glucose, Bld 132 (H) 70 - 99 mg/dL   BUN 8 6 - 23 mg/dL   Creatinine, Ser 0.74 0.50 - 1.35 mg/dL    Calcium 8.5 8.4 - 10.5 mg/dL   Total Protein 6.1 6.0 - 8.3 g/dL   Albumin 3.4 (L) 3.5 - 5.2 g/dL   AST 135 (H) 0 - 37 U/L   ALT 187 (H) 0 - 53 U/L   Alkaline Phosphatase 77 39 - 117 U/L   Total Bilirubin 1.2 0.3 - 1.2 mg/dL   GFR calc non Af Amer 88 (L) >90 mL/min   GFR calc Af Amer >90 >90 mL/min    Comment: (NOTE) The eGFR has been calculated using the CKD EPI equation. This calculation has not been validated in all clinical situations. eGFR's persistently <90 mL/min signify possible Chronic Kidney Disease.    Anion gap 7 5 - 15  CBC     Status: Abnormal   Collection Time: 08/04/14  5:50 AM  Result Value Ref Range   WBC 11.9 (H) 4.0 - 10.5 K/uL   RBC 4.00 (L) 4.22 - 5.81 MIL/uL   Hemoglobin 12.0 (L) 13.0 - 17.0 g/dL   HCT 36.8 (L) 39.0 - 52.0 %   MCV 92.0 78.0 - 100.0 fL   MCH 30.0 26.0 - 34.0 pg   MCHC 32.6 30.0 - 36.0 g/dL   RDW 12.8 11.5 - 15.5 %   Platelets 206 150 - 400 K/uL  Comprehensive metabolic panel     Status: Abnormal   Collection Time: 08/04/14  5:50 AM  Result Value Ref Range   Sodium 138 135 - 145 mmol/L   Potassium 3.8 3.5 - 5.1 mmol/L   Chloride 104 96 - 112 mmol/L   CO2 27 19 - 32 mmol/L   Glucose, Bld 106 (H) 70 - 99 mg/dL   BUN 8 6 - 23 mg/dL   Creatinine, Ser 0.71 0.50 - 1.35 mg/dL   Calcium 8.7 8.4 - 10.5 mg/dL   Total Protein 6.3 6.0 - 8.3 g/dL   Albumin 3.2 (L) 3.5 - 5.2 g/dL   AST 58 (H) 0 - 37 U/L   ALT 131 (H) 0 - 53 U/L   Alkaline Phosphatase 82 39 - 117 U/L   Total Bilirubin 0.8 0.3 - 1.2 mg/dL   GFR calc non Af Amer 90 (L) >90 mL/min   GFR calc Af Amer >90 >90 mL/min    Comment: (NOTE) The eGFR has been calculated using the CKD EPI equation. This calculation has not been validated in all clinical situations. eGFR's persistently <90 mL/min signify possible Chronic Kidney Disease.    Anion gap 7 5 - 15    Imaging / Studies: No results found.  Medications / Allergies: per chart  Antibiotics: Anti-infectives    Start      Dose/Rate Route Frequency Ordered Stop   08/01/14 1800  cefOXitin (MEFOXIN) 1 g in dextrose 5 % 50 mL IVPB     1 g 100 mL/hr over 30 Minutes Intravenous Every 6 hours 08/01/14 1706 08/02/14 0559   08/01/14  0459  cefOXitin (MEFOXIN) 2 g in dextrose 5 % 50 mL IVPB     2 g 100 mL/hr over 30 Minutes Intravenous On call to O.R. 08/01/14 0958 08/01/14 1358       Note: Portions of this report may have been transcribed using voice recognition software. Every effort was made to ensure accuracy; however, inadvertent computerized transcription errors may be present.   Any transcriptional errors that result from this process are unintentional.     Adin Hector, M.D., F.A.C.S. Gastrointestinal and Minimally Invasive Surgery [facility] [address] [place], [place] [address] [phone] Main / Paging   08/04/2014

## 2014-08-05 LAB — CBC
HEMATOCRIT: 35 % — AB (ref 39.0–52.0)
Hemoglobin: 11.2 g/dL — ABNORMAL LOW (ref 13.0–17.0)
MCH: 29.4 pg (ref 26.0–34.0)
MCHC: 32 g/dL (ref 30.0–36.0)
MCV: 91.9 fL (ref 78.0–100.0)
Platelets: 219 10*3/uL (ref 150–400)
RBC: 3.81 MIL/uL — ABNORMAL LOW (ref 4.22–5.81)
RDW: 13 % (ref 11.5–15.5)
WBC: 8.6 10*3/uL (ref 4.0–10.5)

## 2014-08-05 LAB — TYPE AND SCREEN
ABO/RH(D): O POS
Antibody Screen: NEGATIVE
UNIT DIVISION: 0
Unit division: 0
Unit division: 0
Unit division: 0

## 2014-08-05 LAB — COMPREHENSIVE METABOLIC PANEL
ALT: 93 U/L — AB (ref 0–53)
AST: 35 U/L (ref 0–37)
Albumin: 3 g/dL — ABNORMAL LOW (ref 3.5–5.2)
Alkaline Phosphatase: 79 U/L (ref 39–117)
Anion gap: 8 (ref 5–15)
BILIRUBIN TOTAL: 0.6 mg/dL (ref 0.3–1.2)
BUN: 12 mg/dL (ref 6–23)
CO2: 29 mmol/L (ref 19–32)
Calcium: 8.9 mg/dL (ref 8.4–10.5)
Chloride: 104 mmol/L (ref 96–112)
Creatinine, Ser: 0.86 mg/dL (ref 0.50–1.35)
GFR calc Af Amer: >90 mL/min (ref >90)
GFR, EST NON AFRICAN AMERICAN: 83 mL/min — AB (ref >90)
GLUCOSE: 101 mg/dL — AB (ref 70–99)
POTASSIUM: 3.7 mmol/L (ref 3.5–5.1)
SODIUM: 141 mmol/L (ref 135–145)
Total Protein: 6.1 g/dL (ref 6.0–8.3)

## 2014-08-05 NOTE — Progress Notes (Signed)
Patient ID: Ronnie Nicholson, male   DOB: [birth year], 74 y.o.   MRN: [id number] 4 Days Post-Op  Subjective: Doing well.  Had a dark stool.  Tolerated full liquids.  + flatus.  Taking tylenol for pain.     Objective: Vital signs in last 24 hours: Temp:  [98.1 F (36.7 C)-98.2 F (36.8 C)] 98.1 F (36.7 C) (02/29 0627) Pulse Rate:  [71-83] 83 (02/29 1443) Resp:  [16] 16 (02/29 1443) BP: (120-130)/(62-65) 120/62 mmHg (02/29 1443) SpO2:  [98 %-99 %] 98 % (02/29 0627) Last BM Date: 07/31/14  Intake/Output from previous day: 02/28 0701 - 02/29 0700 In: 600 [P.O.:600] Out: 1350 [Urine:1300; Drains:50] Intake/Output this shift: Total I/O In: 600 [P.O.:600] Out: 9 [Drains:9]  General appearance: alert, cooperative and no distress Resp: breathing comfortably Cardio: regular rate and rhythm GI: soft, approp tender, dressing with some serosanguinous staining.    Lab Results:   Recent Labs  08/04/14 0550 08/05/14 0520  WBC 11.9* 8.6  HGB 12.0* 11.2*  HCT 36.8* 35.0*  PLT 206 219   BMET  Recent Labs  08/04/14 0550 08/05/14 0520  NA 138 141  K 3.8 3.7  CL 104 104  CO2 27 29  GLUCOSE 106* 101*  BUN 8 12  CREATININE 0.71 0.86  CALCIUM 8.7 8.9   PT/INR No results for input(s): LABPROT, INR in the last 72 hours. ABG No results for input(s): PHART, HCO3 in the last 72 hours.  Invalid input(s): PCO2, PO2  Studies/Results: No results found.  Anti-infectives: Anti-infectives    Start     Dose/Rate Route Frequency Ordered Stop   08/01/14 1800  cefOXitin (MEFOXIN) 1 g in dextrose 5 % 50 mL IVPB     1 g 100 mL/hr over 30 Minutes Intravenous Every 6 hours 08/01/14 1706 08/02/14 0559   08/01/14 0958  cefOXitin (MEFOXIN) 2 g in dextrose 5 % 50 mL IVPB     2 g 100 mL/hr over 30 Minutes Intravenous On call to O.R. 08/01/14 0958 08/01/14 1358      Assessment/Plan: s/p Procedure(s): OPEN PARTIAL HEPATECTOMY, INTRA- OPERATIVE ULTRASOUND, COMMON BILE DUCT RESECTION,  ROUX-EN-Y HEPATICOJEJUNOSTOMY Check labs tomorrow.  If Hgb OK and pt doing well, may be able to go home tomorrow.    LOS: 4 days    [facility] 08/05/2014

## 2014-08-06 LAB — COMPREHENSIVE METABOLIC PANEL
ALBUMIN: 3.1 g/dL — AB (ref 3.5–5.2)
ALK PHOS: 93 U/L (ref 39–117)
ALT: 75 U/L — ABNORMAL HIGH (ref 0–53)
ANION GAP: 9 (ref 5–15)
AST: 32 U/L (ref 0–37)
BUN: 15 mg/dL (ref 6–23)
CO2: 28 mmol/L (ref 19–32)
CREATININE: 0.85 mg/dL (ref 0.50–1.35)
Calcium: 8.6 mg/dL (ref 8.4–10.5)
Chloride: 101 mmol/L (ref 96–112)
GFR calc non Af Amer: 83 mL/min — ABNORMAL LOW (ref >90)
GLUCOSE: 98 mg/dL (ref 70–99)
Potassium: 3.5 mmol/L (ref 3.5–5.1)
Sodium: 138 mmol/L (ref 135–145)
TOTAL PROTEIN: 6.1 g/dL (ref 6.0–8.3)
Total Bilirubin: 0.8 mg/dL (ref 0.3–1.2)

## 2014-08-06 LAB — CBC
HCT: 35.5 % — ABNORMAL LOW (ref 39.0–52.0)
HEMOGLOBIN: 11.5 g/dL — AB (ref 13.0–17.0)
MCH: 29.6 pg (ref 26.0–34.0)
MCHC: 32.4 g/dL (ref 30.0–36.0)
MCV: 91.5 fL (ref 78.0–100.0)
PLATELETS: 252 10*3/uL (ref 150–400)
RBC: 3.88 MIL/uL — ABNORMAL LOW (ref 4.22–5.81)
RDW: 12.9 % (ref 11.5–15.5)
WBC: 8.7 10*3/uL (ref 4.0–10.5)

## 2014-08-06 MED ORDER — ACETAMINOPHEN 500 MG PO TABS: 1000.0000 mg | ORAL_TABLET | Freq: Three times a day (TID) | ORAL | Status: DC

## 2014-08-06 NOTE — Discharge Summary (Signed)
Physician Discharge Summary  Patient ID: Ronnie Nicholson MRN: [id number] DOB/AGE: 13-Dec-1940 74 y.o.  Admit date: 08/01/2014 Discharge date: 08/06/2014  Admission Diagnoses: Patient Active Problem List   Diagnosis Date Noted  . Cancer   . Family history of colon cancer   . Family history of breast cancer   . 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 Neck pain 11/13/2012  . Neck stiffness 11/13/2012  . Muscle pain, cervical 11/13/2012  . Personal history of colonic polyps - adenomatous 07/01/2011  . Symptomatic premature ventricular contractions 05/20/2011  . Mild aortic stenosis 05/20/2011  . Bilateral carotid artery disease 05/20/2011  . Palpitations 09/04/2010     Discharge Diagnoses:  Same  Discharged Condition: good  Hospital Course: Patient was admitted to the stepdown unit following an open partial right hepatectomy, common bile duct resection and roux-en-y hepaticojejunostomy for gallbladder cancer.  He had some intraoperative hypotension that resolved, and basically he was hypertensive all night.  His urine output and blood pressure was good by the morning after surgery, and he was transferred to the floor.  He did well with pain control, only needing a little bit of IV narcotic.  He had his diet slowly advanced, and transitioned to tylenol for pain control.  He was very ambulatory, walking 4-8 laps around the floor per day.  He was able to void with the foley out.  He had several bowel movements and tolerated regular diet without issue.  He was discharged to home on POD 5 in good condition.    Consults: None  Significant Diagnostic Studies: labs: HCT 35 prior to discharge.  LFTs trending down  Treatments: surgery: see above.    Discharge Exam: Blood pressure 128/60, pulse 76, temperature 98.6 F (37 C), temperature source Oral, resp. rate 16, height 6\' 1"  (1.854 m), weight 186 lb 4.6 oz (84.5 kg), SpO2 98 %. General appearance: alert, cooperative and no distress Resp:  breathing comfortably GI: soft, non distended, non tender.  no erythema or drainage from wound.    Disposition: 01-Home or Self Care     Medication List    TAKE these medications        acetaminophen 500 MG tablet  Commonly known as:  TYLENOL  Take 2 tablets (1,000 mg total) by mouth 3 (three) times daily.     aspirin EC 81 MG tablet  Take 81 mg by mouth daily.     HYDROcodone-acetaminophen 5-325 MG per tablet  Commonly known as:  NORCO  Take 1-2 tablets by mouth every 4 (four) hours as needed.     metoprolol tartrate 25 MG tablet  Commonly known as:  LOPRESSOR  Take 25 mg by mouth 2 (two) times daily.     pravastatin 20 MG tablet  Commonly known as:  PRAVACHOL  Take 1 tablet (20 mg total) by mouth daily.       Follow-up Information    Follow up with [facility], MD In 2 weeks.   Specialty:  General Surgery   Contact information:   [address] [place] [address] [phone]       Signed: [name] 08/06/2014, 7:37 AM

## 2014-08-06 NOTE — Discharge Instructions (Signed)
[facility]      [facility] °[phone] ° °ABDOMINAL SURGERY: POST OP INSTRUCTIONS ° °Always review your discharge instruction sheet given to you by the facility where your surgery was performed. ° °IF YOU HAVE DISABILITY OR FAMILY LEAVE FORMS, YOU MUST BRING THEM TO THE OFFICE FOR PROCESSING.  PLEASE DO NOT GIVE THEM TO YOUR DOCTOR. ° °1. A prescription for pain medication may be given to you upon discharge.  Take your pain medication as prescribed, if needed.  If narcotic pain medicine is not needed, then you may take acetaminophen (Tylenol) or ibuprofen (Advil) as needed. °2. Take your usually prescribed medications unless otherwise directed. °3. If you need a refill on your pain medication, please contact your pharmacy. They will contact our office to request authorization.  Prescriptions will not be filled after 5pm or on week-ends. °4. You should follow a light diet the first few days after arrival home, such as soup and crackers, pudding, etc.unless your doctor has advised otherwise. A high-fiber, low fat diet can be resumed as tolerated.   Be sure to include lots of fluids daily. Most patients will experience some swelling and bruising on the chest and neck area.  Ice packs will help.  Swelling and bruising can take several days to resolve °5. Most patients will experience some swelling and bruising in the area of the incision. Ice pack will help. Swelling and bruising can take several days to resolve..  °6. It is common to experience some constipation if taking pain medication after surgery.  Increasing fluid intake and taking a stool softener will usually help or prevent this problem from occurring.  A mild laxative (Milk of Magnesia or Miralax) should be taken according to package directions if there are no bowel movements after 48 hours. °7.  You may have steri-strips (small skin tapes) in place directly over the incision.  These strips should be left on the skin for 10-14 days.  If your  surgeon used skin glue on the incision, you may shower in 48 hours.  The glue will flake off over the next 2-3 weeks.  Any sutures or staples will be removed at the office during your follow-up visit. You may find that a light gauze bandage over your incision may keep your staples from being rubbed or pulled. You may shower and replace the bandage daily. °8. ACTIVITIES:  You may resume regular (light) daily activities beginning the next day--such as daily self-care, walking, climbing stairs--gradually increasing activities as tolerated.  You may have sexual intercourse when it is comfortable.  Refrain from any heavy lifting or straining until approved by your doctor. °a. You may drive when you no longer are taking prescription pain medication, you can comfortably wear a seatbelt, and you can safely maneuver your car and apply brakes °b. Return to Work: __________8 weeks if applicable_________________________ °9. You should see your doctor in the office for a follow-up appointment approximately two weeks after your surgery.  Make sure that you call for this appointment within a day or two after you arrive home to insure a convenient appointment time. °OTHER INSTRUCTIONS:  °_____________________________________________________________ °_____________________________________________________________ ° °WHEN TO CALL YOUR DOCTOR: °1. Fever over 101.0 °2. Inability to urinate °3. Nausea and/or vomiting °4. Extreme swelling or bruising °5. Continued bleeding from incision. °6. Increased pain, redness, or drainage from the incision. °7. Difficulty swallowing or breathing °8. Muscle cramping or spasms. °9. Numbness or tingling in hands or feet or around lips. ° °The clinic staff is   available to answer your questions during regular business hours.  Please don’t hesitate to call and ask to speak to one of the nurses if you have concerns. ° °For further questions, please visit [email or web address] ° ° ° °

## 2014-08-19 ENCOUNTER — Other Ambulatory Visit: Payer: Self-pay | Admitting: General Surgery

## 2014-08-19 ENCOUNTER — Other Ambulatory Visit (INDEPENDENT_AMBULATORY_CARE_PROVIDER_SITE_OTHER): Payer: Self-pay | Admitting: General Surgery

## 2014-08-19 ENCOUNTER — Ambulatory Visit
Admission: RE | Admit: 2014-08-19 | Discharge: 2014-08-19 | Disposition: A | Discharge status: Home / Self Care | Payer: Medicare Other | Source: Ambulatory Visit | Attending: General Surgery | Admitting: General Surgery

## 2014-08-19 DIAGNOSIS — R1011 Right upper quadrant pain: Secondary | ICD-10-CM

## 2014-08-19 MED ORDER — IOHEXOL 300 MG/ML  SOLN
30.0000 mL | Freq: Once | INTRAMUSCULAR | Status: AC | PRN
  Administered 2014-08-19: 30 mL via ORAL

## 2014-08-19 MED ORDER — IOPAMIDOL (ISOVUE-300) INJECTION 61%
100.0000 mL | Freq: Once | INTRAVENOUS | Status: AC | PRN
  Administered 2014-08-19: 100 mL via INTRAVENOUS

## 2014-08-19 NOTE — Addendum Note (Signed)
Addended by: Adin Hector on: 08/19/2014 09:37 AM   Modules accepted: Orders

## 2014-08-22 ENCOUNTER — Other Ambulatory Visit: Payer: Self-pay | Admitting: General Surgery

## 2014-08-22 ENCOUNTER — Telehealth: Payer: Self-pay | Admitting: Oncology

## 2014-08-22 ENCOUNTER — Ambulatory Visit (HOSPITAL_BASED_OUTPATIENT_CLINIC_OR_DEPARTMENT_OTHER): Payer: Medicare Other | Admitting: Oncology

## 2014-08-22 ENCOUNTER — Other Ambulatory Visit: Payer: Medicare Other

## 2014-08-22 ENCOUNTER — Telehealth: Payer: Self-pay | Admitting: *Deleted

## 2014-08-22 ENCOUNTER — Encounter: Payer: Self-pay | Admitting: *Deleted

## 2014-08-22 ENCOUNTER — Other Ambulatory Visit (HOSPITAL_BASED_OUTPATIENT_CLINIC_OR_DEPARTMENT_OTHER): Payer: Medicare Other

## 2014-08-22 VITALS — BP 130/67 | HR 79 | Temp 98.0°F | Resp 18 | Ht 73.0 in | Wt 174.5 lb

## 2014-08-22 DIAGNOSIS — Z809 Family history of malignant neoplasm, unspecified: Secondary | ICD-10-CM

## 2014-08-22 DIAGNOSIS — Z800 Family history of malignant neoplasm of digestive organs: Secondary | ICD-10-CM | POA: Diagnosis not present

## 2014-08-22 DIAGNOSIS — R1011 Right upper quadrant pain: Secondary | ICD-10-CM

## 2014-08-22 DIAGNOSIS — C23 Malignant neoplasm of gallbladder: Secondary | ICD-10-CM | POA: Diagnosis not present

## 2014-08-22 LAB — COMPREHENSIVE METABOLIC PANEL (CC13)
ALT: 38 U/L (ref 0–55)
AST: 20 U/L (ref 5–34)
Albumin: 3.2 g/dL — ABNORMAL LOW (ref 3.5–5.0)
Alkaline Phosphatase: 163 U/L — ABNORMAL HIGH (ref 40–150)
Anion Gap: 9 mEq/L (ref 3–11)
BILIRUBIN TOTAL: 0.29 mg/dL (ref 0.20–1.20)
BUN: 16.3 mg/dL (ref 7.0–26.0)
CO2: 27 mEq/L (ref 22–29)
CREATININE: 1 mg/dL (ref 0.7–1.3)
Calcium: 9 mg/dL (ref 8.4–10.4)
Chloride: 104 mEq/L (ref 98–109)
EGFR: 75 mL/min/{1.73_m2} — ABNORMAL LOW (ref >90)
GLUCOSE: 105 mg/dL (ref 70–140)
POTASSIUM: 4.7 meq/L (ref 3.5–5.1)
SODIUM: 140 meq/L (ref 136–145)
TOTAL PROTEIN: 6.7 g/dL (ref 6.4–8.3)

## 2014-08-22 LAB — CBC WITH DIFFERENTIAL/PLATELET
BASO%: 1.3 % (ref 0.0–2.0)
BASOS ABS: 0.1 10*3/uL (ref 0.0–0.1)
EOS%: 4.5 % (ref 0.0–7.0)
Eosinophils Absolute: 0.4 10*3/uL (ref 0.0–0.5)
HCT: 37.1 % — ABNORMAL LOW (ref 38.4–49.9)
HEMOGLOBIN: 11.7 g/dL — AB (ref 13.0–17.1)
LYMPH%: 14.7 % (ref 14.0–49.0)
MCH: 28.7 pg (ref 27.2–33.4)
MCHC: 31.6 g/dL — AB (ref 32.0–36.0)
MCV: 91.1 fL (ref 79.3–98.0)
MONO#: 0.6 10*3/uL (ref 0.1–0.9)
MONO%: 7.2 % (ref 0.0–14.0)
NEUT%: 72.3 % (ref 39.0–75.0)
NEUTROS ABS: 6.1 10*3/uL (ref 1.5–6.5)
Platelets: 387 10*3/uL (ref 140–400)
RBC: 4.07 10*6/uL — AB (ref 4.20–5.82)
RDW: 13.2 % (ref 11.0–14.6)
WBC: 8.4 10*3/uL (ref 4.0–10.3)
lymph#: 1.2 10*3/uL (ref 0.9–3.3)

## 2014-08-22 NOTE — Patient Instructions (Addendum)
[facility] Discharge Instructions  RECOMMENDATIONS MADE BY THE CONSULTANT AND ANY TEST RESULTS WILL BE SENT TO YOUR REFERRING PHYSICIAN.  EXAM FINDINGS BY THE PHYSICIAN TODAY AND SIGNS OR SYMPTOMS TO REPORT TO CLINIC OR PRIMARY PHYSICIAN:   MEDICATIONS PRESCRIBED:   N/A  INSTRUCTIONS GIVEN AND DISCUSSED:   Gemzar IV every week X 4, then radiation therapy with Xeloda, followed by #8 more treatments of Gemzar to lessen chance of recurrence.  SPECIAL INSTRUCTIONS/FOLLOW-UP:  Will need to attend chemo class  Will arrange for [facility] with Dr. Marlowe Aschoff office  Will arrange for your radiation therapy to be done in [facility] by Dr. Lisbeth Renshaw.  Thank you for choosing [facility] to provide your oncology and hematology care.  To afford each patient quality time with our providers, please arrive at least 30 minutes before your scheduled appointment time.  With your help, our goal is to use those 30 minutes to complete the necessary work-up to ensure our physicians have the information they need to help with your evaluation and healthcare recommendations.     ___________________  Should you have questions after your visit to [facility], please contact our office at [phone] between the hours of 8:30 a.m. and 4:30 p.m.  Voicemails left after 4:00 p.m. will not be returned until the following business day.  For prescription refill requests, have your pharmacy contact our office with your prescription refill request. We request 24 hour notice for all refill requests.

## 2014-08-22 NOTE — Telephone Encounter (Signed)
Per staff message and POF I have scheduled appts. Advised scheduler of appts. JMW  

## 2014-08-22 NOTE — Progress Notes (Signed)
[facility] OFFICE PROGRESS NOTE   Diagnosis: Gallbladder carcinoma  INTERVAL HISTORY:   Ronnie Nicholson returns as scheduled. He was taken to the operating room on 08/01/2014 for an intraoperative liver ultrasound, partial right hepatectomy, common bile duct resection, and hepaticojejunostomy. He developed abdominal pain after eating bakes chicken earlier this week. He was referred for a CT of the abdomen 08/19/2014. No findings for recurrent or metastatic disease. Stable right adrenal nodule.  He now feels well. No pain at present.  The pathology from the 08/01/2014 surgery revealed focal residual invasive adenocarcinoma involving 6 segment 5 of the liver with extensive fibrosis. The resection margins are negative. No evidence of malignancy involving the bile duct. One lymph node contained metastatic adenocarcinoma with extranodal extension. 4 additional lymph nodes were negative for metastatic carcinoma.  Objective:  Vital signs in last 24 hours:  Blood pressure 130/67, pulse 79, temperature 98 F (36.7 C), temperature source Oral, resp. rate 18, height 6\' 1"  (1.854 m), weight 174 lb 8 oz (79.153 kg), SpO2 100 %.    HEENT: Neck without mass Lymphatics: No cervical, supra-clavicular, axillary, or inguinal nodes Resp: Lungs clear bilaterally Cardio: Regular rate and rhythm GI: No hepatomegaly, nontender, no mass, healed surgical incisions Vascular: No leg edema   Lab Results:  Lab Results  Component Value Date   WBC 8.7 08/06/2014   HGB 11.5* 08/06/2014   HCT 35.5* 08/06/2014   MCV 91.5 08/06/2014   PLT 252 08/06/2014   NEUTROABS 4.3 08/01/2014     Imaging:  Ct Abdomen Pelvis W Contrast  08/19/2014   CLINICAL DATA:  Gallbladder adenocarcinoma, status post cholecystectomy on 06/24/2014 and partial hepatectomy on 07/22/2014  EXAM: CT ABDOMEN AND PELVIS WITH CONTRAST  TECHNIQUE: Multidetector CT imaging of the abdomen and pelvis was performed using the  standard protocol following bolus administration of intravenous contrast.  CONTRAST:  100 mL Isovue 300 IV  COMPARISON:  CT chest/abdomen dated 07/17/2014. CT abdomen pelvis dated 05/24/2014.  FINDINGS: Lower chest: Mild scarring in the lateral right lower lobe (series 4/image 12). 7 mm subpleural opacity in the left upper lobe (series 4/image 2), unchanged. Additional 3 mm subpleural nodular opacity in the lingula (series 4/ image 11), unchanged.  Hepatobiliary: Liver is status post a central partial hepatectomy and cholecystectomy with indwelling left biliary stent/drain. Associated pneumobilia.  1.5 x 2.4 cm fluid and gas collection in the surgical bed/ gallbladder fossa, new since partial hepatectomy.  No intrahepatic or extrahepatic ductal dilatation.  Pancreas: Mild enlargement of the pancreatic uncinate process anteriorly (series 3/ image 37), new, correlate for acute pancreatitis.  Spleen: Within normal limits.  Adrenals/Urinary Tract: 1.8 x 1.3 cm right adrenal nodule (series 3/ image 22), indeterminate.  Left adrenal gland is within normal limits.  Bilateral renal sinus cysts, left greater than right. Kidneys are otherwise within normal limits. No hydronephrosis.  Bladder is within normal limits.  Stomach/Bowel: Stomach is unremarkable.  No evidence of bowel obstruction.  Vascular/Lymphatic: Atherosclerotic calcifications of the abdominal aorta and branch vessels.  No suspicious abdominopelvic lymphadenopathy.  Reproductive: Prostate is notable for dystrophic calcifications.  Other: No abdominopelvic ascites.  Musculoskeletal: Degenerative changes of the visualized thoracolumbar spine.  IMPRESSION: Postsurgical changes status post cholecystectomy with central partial hepatectomy and indwelling left biliary stent/drain. 1.5 x 2.4 cm fluid collection in the surgical bed/gallbladder fossa, likely postsurgical.  No findings specific for recurrent or metastatic disease.  Enlargement of the anterior pancreatic  uncinate process, new, correlate for acute pancreatitis.  Stable 1.8  cm right adrenal nodule, statistically likely reflecting a benign adrenal adenoma, although indeterminate. Consider MRI abdomen without contrast for further characterization as clinically warranted.  Stable subpleural left upper lobe nodules, likely benign given subpleural location, attention on follow-up suggested.   Electronically Signed   By: Julian Hy M.D.   On: 08/19/2014 15:46    Medications: I have reviewed the patient's current medications.  Assessment/Plan: 1. 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 Intermittent right upper abdominal pain beginning in the summer of 2015-likely related to symptomatic cholelithiasis  3. History of colon polyps  4. Family history of multiple cancers including colon cancer, status post a genetics evaluation, multi cancer gene panel pending  Disposition:  Ronnie Nicholson was found to have residual tumor involving the liver and a lymph node at the re-resection procedure. He underwent an R0 resection. He has been diagnosed with stage IIIB adenocarcinoma the gallbladder. I discussed the indication for adjuvant therapy with Ronnie Nicholson and his wife. He has a significant chance of developing recurrent disease over the next few years. I explained the lack of randomized data to prove a survival benefit for adjuvant therapy in  this setting. However expert panels recommend considering adjuvant chemotherapy or chemotherapy/radiation. He is otherwise healthy and has a good performance status at present. I recommend adjuvant chemotherapy and radiation. Ronnie Nicholson agrees. I recommend single agent gemcitabine to be followed by concurrent capecitabine/radiation and then additional gemcitabine. We will make a referral to radiation oncology. He will be referred to Dr. Barry Nicholson for placement of a Port-A-Cath. I reviewed the potential toxicities associated with gemcitabine including the chance for hematologic toxicity, fever, pneumonitis, and an allergic reaction. He will attend a chemotherapy teaching class. The plan is to begin a first treatment with gemcitabine on 09/05/2014. Ronnie Nicholson will be scheduled for an office visit 09/12/2014.  Betsy Coder, MD  08/22/2014  9:52 AM

## 2014-08-22 NOTE — Telephone Encounter (Signed)
Lft msg for pt confirming labs/ov and pt should be hearing from referral Dr. Barry Dienes nurse for portacath and Santiago Glad has scheduled pt with Dr. Lisbeth Renshaw for radiation referral. I scheduled pt for lab add on and chemo education today and sent msg to add chemo..... KJ

## 2014-08-23 ENCOUNTER — Encounter (HOSPITAL_COMMUNITY): Payer: Self-pay | Admitting: *Deleted

## 2014-08-23 LAB — CANCER ANTIGEN 19-9: CA 19-9: 54.9 U/mL — ABNORMAL HIGH (ref <35.0)

## 2014-08-25 MED ORDER — CEFAZOLIN SODIUM-DEXTROSE 2-3 GM-% IV SOLR
2.0000 g | INTRAVENOUS | Status: AC
  Filled 2014-08-25: qty 50

## 2014-08-26 ENCOUNTER — Ambulatory Visit (HOSPITAL_COMMUNITY): Payer: Medicare Other | Admitting: Anesthesiology

## 2014-08-26 ENCOUNTER — Encounter (HOSPITAL_COMMUNITY)
Admission: RE | Disposition: A | Discharge status: Home / Self Care | Payer: Self-pay | Source: Ambulatory Visit | Attending: General Surgery

## 2014-08-26 ENCOUNTER — Ambulatory Visit (HOSPITAL_COMMUNITY)
Admission: RE | Admit: 2014-08-26 | Discharge: 2014-08-26 | Disposition: A | Discharge status: Home / Self Care | Payer: Medicare Other | Source: Ambulatory Visit | Attending: General Surgery | Admitting: General Surgery

## 2014-08-26 ENCOUNTER — Ambulatory Visit (HOSPITAL_COMMUNITY): Payer: Medicare Other

## 2014-08-26 ENCOUNTER — Encounter: Payer: Self-pay | Admitting: Radiation Oncology

## 2014-08-26 ENCOUNTER — Encounter (HOSPITAL_COMMUNITY): Payer: Self-pay | Admitting: *Deleted

## 2014-08-26 DIAGNOSIS — I350 Nonrheumatic aortic (valve) stenosis: Secondary | ICD-10-CM | POA: Diagnosis not present

## 2014-08-26 DIAGNOSIS — M1990 Unspecified osteoarthritis, unspecified site: Secondary | ICD-10-CM | POA: Insufficient documentation

## 2014-08-26 DIAGNOSIS — Z419 Encounter for procedure for purposes other than remedying health state, unspecified: Secondary | ICD-10-CM

## 2014-08-26 DIAGNOSIS — I10 Essential (primary) hypertension: Secondary | ICD-10-CM | POA: Diagnosis not present

## 2014-08-26 DIAGNOSIS — Z95828 Presence of other vascular implants and grafts: Secondary | ICD-10-CM

## 2014-08-26 DIAGNOSIS — C23 Malignant neoplasm of gallbladder: Secondary | ICD-10-CM | POA: Diagnosis not present

## 2014-08-26 DIAGNOSIS — I739 Peripheral vascular disease, unspecified: Secondary | ICD-10-CM | POA: Diagnosis not present

## 2014-08-26 DIAGNOSIS — Z8719 Personal history of other diseases of the digestive system: Secondary | ICD-10-CM | POA: Insufficient documentation

## 2014-08-26 DIAGNOSIS — J449 Chronic obstructive pulmonary disease, unspecified: Secondary | ICD-10-CM | POA: Insufficient documentation

## 2014-08-26 DIAGNOSIS — Z87891 Personal history of nicotine dependence: Secondary | ICD-10-CM | POA: Diagnosis not present

## 2014-08-26 DIAGNOSIS — Z7982 Long term (current) use of aspirin: Secondary | ICD-10-CM | POA: Insufficient documentation

## 2014-08-26 DIAGNOSIS — Z9049 Acquired absence of other specified parts of digestive tract: Secondary | ICD-10-CM | POA: Diagnosis not present

## 2014-08-26 DIAGNOSIS — Z79899 Other long term (current) drug therapy: Secondary | ICD-10-CM | POA: Diagnosis not present

## 2014-08-26 DIAGNOSIS — Z9889 Other specified postprocedural states: Secondary | ICD-10-CM

## 2014-08-26 HISTORY — DX: Unspecified hearing loss, unspecified ear: H91.90

## 2014-08-26 HISTORY — PX: PORTACATH PLACEMENT: SHX2246

## 2014-08-26 HISTORY — DX: Cardiac murmur, unspecified: R01.1

## 2014-08-26 SURGERY — INSERTION, TUNNELED CENTRAL VENOUS DEVICE, WITH PORT
Anesthesia: General

## 2014-08-26 MED ORDER — FENTANYL CITRATE 0.05 MG/ML IJ SOLN
INTRAMUSCULAR | Status: AC
  Filled 2014-08-26: qty 5

## 2014-08-26 MED ORDER — CEFAZOLIN SODIUM-DEXTROSE 2-3 GM-% IV SOLR
INTRAVENOUS | Status: DC | PRN
  Administered 2014-08-26: 2 g via INTRAVENOUS

## 2014-08-26 MED ORDER — BUPIVACAINE-EPINEPHRINE (PF) 0.25% -1:200000 IJ SOLN
INTRAMUSCULAR | Status: AC
  Filled 2014-08-26: qty 30

## 2014-08-26 MED ORDER — FENTANYL CITRATE 0.05 MG/ML IJ SOLN
INTRAMUSCULAR | Status: DC | PRN
  Administered 2014-08-26: 50 ug via INTRAVENOUS

## 2014-08-26 MED ORDER — STERILE WATER FOR INJECTION IJ SOLN
INTRAMUSCULAR | Status: AC
  Filled 2014-08-26: qty 10

## 2014-08-26 MED ORDER — SODIUM CHLORIDE 0.9 % IR SOLN
Status: DC | PRN
  Administered 2014-08-26: 08:00:00

## 2014-08-26 MED ORDER — PROPOFOL 10 MG/ML IV BOLUS
INTRAVENOUS | Status: DC | PRN
  Administered 2014-08-26: 20 mg via INTRAVENOUS
  Administered 2014-08-26: 150 mg via INTRAVENOUS

## 2014-08-26 MED ORDER — ONDANSETRON HCL 4 MG/2ML IJ SOLN
INTRAMUSCULAR | Status: AC
  Filled 2014-08-26: qty 2

## 2014-08-26 MED ORDER — GLYCOPYRROLATE 0.2 MG/ML IJ SOLN
INTRAMUSCULAR | Status: AC
  Filled 2014-08-26: qty 16

## 2014-08-26 MED ORDER — PROPOFOL 10 MG/ML IV BOLUS
INTRAVENOUS | Status: AC
  Filled 2014-08-26: qty 20

## 2014-08-26 MED ORDER — 0.9 % SODIUM CHLORIDE (POUR BTL) OPTIME
TOPICAL | Status: DC | PRN
  Administered 2014-08-26: 1000 mL

## 2014-08-26 MED ORDER — HEPARIN SOD (PORK) LOCK FLUSH 100 UNIT/ML IV SOLN
INTRAVENOUS | Status: AC
Start: 2014-08-26 — End: 2014-08-26
  Filled 2014-08-26: qty 5

## 2014-08-26 MED ORDER — SUCCINYLCHOLINE CHLORIDE 20 MG/ML IJ SOLN
INTRAMUSCULAR | Status: AC
  Filled 2014-08-26: qty 1

## 2014-08-26 MED ORDER — OXYCODONE HCL 5 MG PO TABS: 5.0000 mg | ORAL_TABLET | ORAL | Status: DC | PRN

## 2014-08-26 MED ORDER — LIDOCAINE HCL 1 % IJ SOLN
INTRAMUSCULAR | Status: DC | PRN
  Administered 2014-08-26: 10 mL via INTRADERMAL

## 2014-08-26 MED ORDER — NEOSTIGMINE METHYLSULFATE 10 MG/10ML IV SOLN
INTRAVENOUS | Status: AC
  Filled 2014-08-26: qty 3

## 2014-08-26 MED ORDER — ONDANSETRON HCL 4 MG/2ML IJ SOLN
INTRAMUSCULAR | Status: DC | PRN
Start: 2014-08-26 — End: 2014-08-26
  Administered 2014-08-26: 4 mg via INTRAVENOUS

## 2014-08-26 MED ORDER — FENTANYL CITRATE 0.05 MG/ML IJ SOLN: 25.0000 ug | INTRAMUSCULAR | Status: DC | PRN

## 2014-08-26 MED ORDER — MIDAZOLAM HCL 2 MG/2ML IJ SOLN
INTRAMUSCULAR | Status: AC
  Filled 2014-08-26: qty 2

## 2014-08-26 MED ORDER — BUPIVACAINE-EPINEPHRINE (PF) 0.25% -1:200000 IJ SOLN
INTRAMUSCULAR | Status: DC | PRN
  Administered 2014-08-26: 08:00:00 via INTRADERMAL

## 2014-08-26 MED ORDER — EPHEDRINE SULFATE 50 MG/ML IJ SOLN
INTRAMUSCULAR | Status: DC | PRN
  Administered 2014-08-26 (×2): 10 mg via INTRAVENOUS

## 2014-08-26 MED ORDER — PROMETHAZINE HCL 25 MG/ML IJ SOLN: 6.2500 mg | INTRAMUSCULAR | Status: DC | PRN

## 2014-08-26 MED ORDER — EPHEDRINE SULFATE 50 MG/ML IJ SOLN
INTRAMUSCULAR | Status: AC
  Filled 2014-08-26: qty 1

## 2014-08-26 MED ORDER — LIDOCAINE HCL (PF) 1 % IJ SOLN
INTRAMUSCULAR | Status: AC
  Filled 2014-08-26: qty 30

## 2014-08-26 MED ORDER — LIDOCAINE HCL (CARDIAC) 20 MG/ML IV SOLN
INTRAVENOUS | Status: DC | PRN
  Administered 2014-08-26: 50 mg via INTRAVENOUS

## 2014-08-26 MED ORDER — LACTATED RINGERS IV SOLN
INTRAVENOUS | Status: DC | PRN
  Administered 2014-08-26: 07:00:00 via INTRAVENOUS

## 2014-08-26 MED ORDER — LIDOCAINE HCL (CARDIAC) 20 MG/ML IV SOLN
INTRAVENOUS | Status: AC
  Filled 2014-08-26: qty 5

## 2014-08-26 MED ORDER — HEPARIN SOD (PORK) LOCK FLUSH 100 UNIT/ML IV SOLN
INTRAVENOUS | Status: DC | PRN
  Administered 2014-08-26: 100 [IU] via INTRAVENOUS

## 2014-08-26 SURGICAL SUPPLY — 51 items
BAG DECANTER FOR FLEXI CONT (MISCELLANEOUS) ×3 IMPLANT
BLADE SURG 11 STRL SS (BLADE) ×3 IMPLANT
BLADE SURG 15 STRL LF DISP TIS (BLADE) ×1 IMPLANT
BLADE SURG 15 STRL SS (BLADE) ×2
CANISTER SUCTION 2500CC (MISCELLANEOUS) IMPLANT
CHLORAPREP W/TINT 10.5 ML (MISCELLANEOUS) ×3 IMPLANT
COVER SURGICAL LIGHT HANDLE (MISCELLANEOUS) ×3 IMPLANT
COVER TRANSDUCER ULTRASND GEL (DRAPE) IMPLANT
CRADLE DONUT ADULT HEAD (MISCELLANEOUS) ×3 IMPLANT
DECANTER SPIKE VIAL GLASS SM (MISCELLANEOUS) ×6 IMPLANT
DERMABOND ADHESIVE PROPEN (GAUZE/BANDAGES/DRESSINGS) ×2
DERMABOND ADVANCED (GAUZE/BANDAGES/DRESSINGS) ×2
DERMABOND ADVANCED .7 DNX12 (GAUZE/BANDAGES/DRESSINGS) ×1 IMPLANT
DERMABOND ADVANCED .7 DNX6 (GAUZE/BANDAGES/DRESSINGS) ×1 IMPLANT
DRAPE C-ARM 42X72 X-RAY (DRAPES) ×3 IMPLANT
DRAPE CHEST BREAST 15X10 FENES (DRAPES) ×3 IMPLANT
DRAPE UTILITY XL STRL (DRAPES) ×3 IMPLANT
DRAPE WARM FLUID 44X44 (DRAPE) IMPLANT
ELECT COATED BLADE 2.86 ST (ELECTRODE) ×3 IMPLANT
ELECT REM PT RETURN 9FT ADLT (ELECTROSURGICAL) ×3
ELECTRODE REM PT RTRN 9FT ADLT (ELECTROSURGICAL) ×1 IMPLANT
GAUZE SPONGE 4X4 16PLY XRAY LF (GAUZE/BANDAGES/DRESSINGS) ×3 IMPLANT
GEL ULTRASOUND 20GR AQUASONIC (MISCELLANEOUS) IMPLANT
GLOVE BIO SURGEON STRL SZ 6 (GLOVE) ×6 IMPLANT
GLOVE BIOGEL PI IND STRL 6.5 (GLOVE) ×3 IMPLANT
GLOVE BIOGEL PI INDICATOR 6.5 (GLOVE) ×6
GOWN STRL REUS W/ TWL LRG LVL3 (GOWN DISPOSABLE) ×1 IMPLANT
GOWN STRL REUS W/TWL 2XL LVL3 (GOWN DISPOSABLE) ×3 IMPLANT
GOWN STRL REUS W/TWL LRG LVL3 (GOWN DISPOSABLE) ×2
KIT BASIN OR (CUSTOM PROCEDURE TRAY) ×3 IMPLANT
KIT PORT POWER 8FR ISP CVUE (Catheter) ×3 IMPLANT
KIT PORT POWER 9.6FR MRI PREA (Catheter) IMPLANT
KIT ROOM TURNOVER OR (KITS) ×3 IMPLANT
LIQUID BAND (GAUZE/BANDAGES/DRESSINGS) ×3 IMPLANT
NEEDLE HYPO 25GX1X1/2 BEV (NEEDLE) ×3 IMPLANT
NS IRRIG 1000ML POUR BTL (IV SOLUTION) ×3 IMPLANT
PACK SURGICAL SETUP 50X90 (CUSTOM PROCEDURE TRAY) ×3 IMPLANT
PAD ARMBOARD 7.5X6 YLW CONV (MISCELLANEOUS) ×3 IMPLANT
PENCIL BUTTON HOLSTER BLD 10FT (ELECTRODE) ×3 IMPLANT
SUT MON AB 4-0 PC3 18 (SUTURE) ×3 IMPLANT
SUT PROLENE 2 0 SH DA (SUTURE) ×6 IMPLANT
SUT VIC AB 3-0 SH 27 (SUTURE) ×2
SUT VIC AB 3-0 SH 27X BRD (SUTURE) ×1 IMPLANT
SYR 20ML ECCENTRIC (SYRINGE) ×6 IMPLANT
SYR 5ML LUER SLIP (SYRINGE) ×3 IMPLANT
SYR CONTROL 10ML LL (SYRINGE) ×3 IMPLANT
TOWEL OR 17X24 6PK STRL BLUE (TOWEL DISPOSABLE) ×3 IMPLANT
TOWEL OR 17X26 10 PK STRL BLUE (TOWEL DISPOSABLE) ×3 IMPLANT
TUBE CONNECTING 12'X1/4 (SUCTIONS)
TUBE CONNECTING 12X1/4 (SUCTIONS) IMPLANT
YANKAUER SUCT BULB TIP NO VENT (SUCTIONS) IMPLANT

## 2014-08-26 NOTE — Anesthesia Preprocedure Evaluation (Addendum)
Anesthesia Evaluation  Patient identified by MRN, date of birth, ID band Patient awake    Reviewed: Allergy & Precautions, NPO status , Patient's Chart, lab work & pertinent test results, reviewed documented beta blocker date and time   History of Anesthesia Complications Negative for: history of anesthetic complications  Airway Mallampati: II  TM Distance: >3 FB Neck ROM: Full    Dental no notable dental hx. (+) Dental Advisory Given   Pulmonary COPDformer smoker,  breath sounds clear to auscultation  Pulmonary exam normal       Cardiovascular hypertension, Pt. on home beta blockers + Peripheral Vascular Disease + dysrhythmias + Valvular Problems/Murmurs AS Rhythm:Regular Rate:Normal  Echocardiogram performed on 03/20/2013 demonstrated normal left ventricular systolic function, EF 40-97%, grade 1 diastolic dysfunction, and mild aortic stenosis with a mean gradient of 13 mm mercury. Carotid Dopplers on 06/13/2013 demonstrated 1-39% right internal carotid artery stenosis and 60-79% left internal carotid artery stenosis.   Neuro/Psych negative neurological ROS  negative psych ROS   GI/Hepatic negative GI ROS, Neg liver ROS,   Endo/Other  negative endocrine ROS  Renal/GU negative Renal ROS  negative genitourinary   Musculoskeletal  (+) Arthritis -, Osteoarthritis,    Abdominal   Peds negative pediatric ROS (+)  Hematology negative hematology ROS (+)   Anesthesia Other Findings   Reproductive/Obstetrics negative OB ROS                           Anesthesia Physical  Anesthesia Plan  ASA: III  Anesthesia Plan: General   Post-op Pain Management:    Induction: Intravenous  Airway Management Planned: LMA  Additional Equipment:   Intra-op Plan:   Post-operative Plan: Extubation in OR  Informed Consent: I have reviewed the patients History and Physical, chart, labs and discussed the  procedure including the risks, benefits and alternatives for the proposed anesthesia with the patient or authorized representative who has indicated his/her understanding and acceptance.   Dental advisory given  Plan Discussed with: CRNA, Anesthesiologist and Surgeon  Anesthesia Plan Comments:        Anesthesia Quick Evaluation

## 2014-08-26 NOTE — Discharge Instructions (Signed)
[facility] °Office Phone Number [phone] ° ° POST OP INSTRUCTIONS ° °Always review your discharge instruction sheet given to you by the facility where your surgery was performed. ° °IF YOU HAVE DISABILITY OR FAMILY LEAVE FORMS, YOU MUST BRING THEM TO THE OFFICE FOR PROCESSING.  DO NOT GIVE THEM TO YOUR DOCTOR. ° °1. A prescription for pain medication may be given to you upon discharge.  Take your pain medication as prescribed, if needed.  If narcotic pain medicine is not needed, then you may take acetaminophen (Tylenol) or ibuprofen (Advil) as needed. °2. Take your usually prescribed medications unless otherwise directed °3. If you need a refill on your pain medication, please contact your pharmacy.  They will contact our office to request authorization.  Prescriptions will not be filled after 5pm or on week-ends. °4. You should eat very light the first 24 hours after surgery, such as soup, crackers, pudding, etc.  Resume your normal diet the day after surgery °5. It is common to experience some constipation if taking pain medication after surgery.  Increasing fluid intake and taking a stool softener will usually help or prevent this problem from occurring.  A mild laxative (Milk of Magnesia or Miralax) should be taken according to package directions if there are no bowel movements after 48 hours. °6. You may shower in 48 hours.  The surgical glue will flake off in 2-3 weeks.   °7. ACTIVITIES:  No strenuous activity or heavy lifting for 1 week.   °a. You may drive when you no longer are taking prescription pain medication, you can comfortably wear a seatbelt, and you can safely maneuver your car and apply brakes. °b. RETURN TO WORK:  __________to be determined._______________ °You should see your doctor in the office for a follow-up appointment approximately three-four weeks after your surgery.   ° °WHEN TO CALL YOUR DOCTOR: °1. Fever over 101.0 °2. Nausea and/or vomiting. °3. Extreme swelling  or bruising. °4. Continued bleeding from incision. °5. Increased pain, redness, or drainage from the incision. ° °The clinic staff is available to answer your questions during regular business hours.  Please don’t hesitate to call and ask to speak to one of the nurses for clinical concerns.  If you have a medical emergency, go to the nearest emergency room or call 911.  A surgeon from [facility] is always on call at the hospital. ° °For further questions, please visit [email or web address]  ° °

## 2014-08-26 NOTE — Op Note (Signed)
PREOPERATIVE DIAGNOSIS:  Gallbladder cancer     POSTOPERATIVE DIAGNOSIS:  Same     PROCEDURE: Right subclavian port placement, Bard   Power Port, MRI safe, 8-French.      SURGEON:  [name], MD      ANESTHESIA:  General   FINDINGS:  Good venous return, easy flush, and tip of the catheter and   SVC 24 cm.      SPECIMEN:  None.      ESTIMATED BLOOD LOSS:  Minimal.      COMPLICATIONS:  None known.      PROCEDURE:  Pt was identified in the holding area and taken to   the operating room, where patient was placed supine on the operating room   table.  General anesthesia was induced.  Patient's arms were tucked and the upper   chest and neck were prepped and draped in sterile fashion.  Time-out was   performed according to the surgical safety check list.  When all was   correct, we continued.   Local anesthetic was administered over this   area at the angle of the clavicle.  The vein was accessed with 2 passes of the needle. There was good venous return and the wire passed easily with no ectopy.   Fluoroscopy was used to confirm that the wire was in the vena cava.      The patient was placed back level and the area for the pocket was anethetized   with local anesthetic.  A 3-cm transverse incision was made with a #15   blade.  Cautery was used to divide the subcutaneous tissues down to the   pectoralis muscle.  An [employer] retractor was used to elevate the skin   while a pocket was created on top of the pectoralis fascia.  The port   was placed into the pocket to confirm that it was of adequate size.  The   catheter was preattached to the port.  The port was then secured to the   pectoralis fascia with four 2-0 Prolene sutures.  These were clamped and   not tied down yet.    The catheter was tunneled through to the wire exit   site.  The catheter was placed along the wire to determine what length it should be to be in the SVC.  The catheter was cut at 24 cm.  The tunneler sheath  and dilator were passed over the wire and the dilator and wire were removed.  The catheter was advanced through the tunneler sheath and the tunneler sheath was pulled away.  Care was taken to keep the catheter in the tunneler sheath as this occurred. This was advanced and the tunneler sheath was removed.  There was good venous   return and easy flush of the catheter.  The Prolene sutures were tied   down to the pectoral fascia.  The skin was reapproximated using 3-0   Vicryl interrupted deep dermal sutures.    Fluoroscopy was used to re-confirm good position of the catheter.  The skin   was then closed using 4-0 Monocryl in a subcuticular fashion.  The port was flushed with concentrated heparin flush as well.  The wounds were then cleaned, dried, and dressed with Dermabond.  The patient was awakened from anesthesia and taken to the PACU in stable condition.  Needle, sponge, and instrument counts were correct.               [name], MD

## 2014-08-26 NOTE — Interval H&P Note (Signed)
History and Physical Interval Note:  08/26/2014 7:06 AM  [place]  has presented today for surgery, with the diagnosis of GALLBLADDER CANCER.  He has undergone resection and plans to receive adjuvant chemotherapy.   The various methods of treatment have been discussed with the patient and family. After consideration of risks, benefits and other options for treatment, the patient has consented to  Procedure(s): INSERTION PORT-A-CATH (N/A) as a surgical intervention .  The patient's history has been reviewed, patient examined, no change in status, stable for surgery.  I have reviewed the patient's chart and labs.  Questions were answered to the patient's satisfaction.     Ronnie Nicholson

## 2014-08-26 NOTE — Progress Notes (Addendum)
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, [name]  Past/Anticipated interventions by surgeon, if any: Diagnosis 08/01/14: 1. Liver, partial resection, partial section 5 - FOCAL RESIDUAL INVASIVE ADENOCARCINOMA IN BACKGROUND OF EXTENSIVE FIBROSIS,HEMOSIDERIN LADEN MACROPHAGES AND FOREIGN BODY GIANT CELL REACTIONS.- INKED RESECTION MARGIN, NEGATIVE FOR ATYPIA OR MALIGNANCY. 2. Common Bile Duct , resection - NO EVIDENCE OF MALIGNANCY. EXTENSIVE STROMAL FIBROSIS AND FOREIGN BODY GIANT CELL REACTION.- ONE LYMPH NODE, POSITIVE FOR RESIDUAL METASTATIC ADENOCARCINOMA WITH EXTRANODAL EXTENSION (1/1). 3. Lymph node, biopsy - FOUR LYMPH NODES, NEGATIVE FOR METASTATIC CARCINOMA (0/4).Dr. Harvie Bridge  Past/Anticipated interventions by medical oncology, if any: 1st gemcitabine infusion 09/05/14 @ 1030 am,F/u appt [name] 09/12/14, lab  and gemcitabine infusion  Weight changes, if any:  Stable, has gained back 1-2 lbs back  Bowel/Bladder complaints, if any: belching a lot,takes protonix helping, regular bowel movements, bo problem with bladder  Nausea / Vomiting, if any: None  Pain issues, if any: None  SAFETY ISSUES: No  Prior radiation?No  Pacemaker/ICD?NO  Is the patient on methotrexate? NO  Current Complaints / other details:  Married, 1 son,  B/L hearing loss, wears hearing aids,   Brother died of melanoma, another brother had colon  cancer, a brother had brain tumor, sister metastatic breast cancer, , quit smoking cigarettes  1ppd x 10 years,06/18/1983,no alcohol use or illicit drug use, COPD,  Hydrocele excision, left , b/l carotid artery stenosis,  Mild aortic valve stenosis,  Right shoulder surgery  2004, stapedectomy,. B/L 1970, colonoscopy 06/30/11, hx colon polyps Allergies: Lodine,Flexeril=rash, Tetracyclines & related=rash

## 2014-08-26 NOTE — Anesthesia Postprocedure Evaluation (Signed)
  Anesthesia Post-op Note  Patient: Ronnie Nicholson  Procedure(s) Performed: Procedure(s): INSERTION PORT-A-CATH (N/A)  Patient Location: PACU  Anesthesia Type:General  Level of Consciousness: awake and alert   Airway and Oxygen Therapy: Patient Spontanous Breathing  Post-op Pain: none  Post-op Assessment: Post-op Vital signs reviewed  Post-op Vital Signs: Reviewed  Last Vitals:  Filed Vitals:   08/26/14 0926  BP: 118/60  Pulse: 71  Temp:   Resp: 16    Complications: No apparent anesthesia complications

## 2014-08-26 NOTE — Transfer of Care (Signed)
Immediate Anesthesia Transfer of Care Note  Patient: Ronnie Nicholson  Procedure(s) Performed: Procedure(s): INSERTION PORT-A-CATH (N/A)  Patient Location: PACU  Anesthesia Type:General  Level of Consciousness: awake, alert , oriented, patient cooperative and responds to stimulation  Airway & Oxygen Therapy: Patient Spontanous Breathing and Patient connected to nasal cannula oxygen  Post-op Assessment: Report given to RN and Post -op Vital signs reviewed and stable  Post vital signs: Reviewed and stable  Last Vitals:  Filed Vitals:   08/26/14 0810  BP:   Pulse:   Temp: 36.7 C  Resp:     Complications: No apparent anesthesia complications

## 2014-08-26 NOTE — H&P (View-Only) (Signed)
Ronnie Nicholson 07/12/2014 9:32 AM Location: [facility] Patient #: [id number] DOB: 12-01-1940 Married / Language: Ronnie Nicholson / Race: White Male  History of Present Illness [name]; 07/12/2014 10:39 AM) Patient words: gallbladder.  The patient is a 74 year old male who presents with a complaint of gallbladder cancer. Patient is a 74 year old male who underwent a cholecystectomy by [name] for chronic cholecystitis. He had a severe gallbladder attack on December 18 and underwent a CT and an ultrasound. The only abnormality seen on that was a slightly thickened gallbladder wall. 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 He is unable to get an MRI due to metal in his inner ear. He did undergo a CEA 19-9 and this was elevated at 78. He has not had any megalies or fatigue. He had a small amount of weight loss that happened after his gallbladder attack, but he has not had any weight loss prior to that. He does have a significant family history for cancer. She had a brother with melanoma, another brother with colon cancer, a sister with breast cancer, and another brother with brain cancer. He feels fine and has no symptoms at all. He did not take any pain medication following his cholecystectomy. He is a very robust 57 year old. His wife saw [name] for breast cancer used to be a patient of [name].    Allergies ([name], CMA; 07/12/2014 9:33 AM) Lodine *ANALGESICS - ANTI-INFLAMMATORY* Flexeril *MUSCULOSKELETAL THERAPY AGENTS* Tetracycline *CHEMICALS*  Medication History ([name], CMA; 07/12/2014 9:33 AM) Aspirin Low Strength (81MG  Tablet Chewable, Oral) Active. Lopressor (50MG  Tablet, Oral) Active.  Review of Systems [name];  07/12/2014 10:39 AM) All other systems negative   Vitals ([name] CMA; 07/12/2014 9:33 AM) 07/12/2014 9:33 AM Weight: 187 lb Height: 73in Body Surface Area: 2.09 m Body Mass Index: 24.67 kg/m Temp.: 97.58F(Temporal)  Pulse: 78 (Regular)  BP: 128/80 (Sitting, Left Arm, Standard)    Physical Exam [name]; 07/12/2014 10:40 AM) General Mental Status-Alert. General Appearance-Consistent with stated age. Hydration-Well hydrated. Voice-Normal.  Head and Neck Head-normocephalic, atraumatic with no lesions or palpable masses.  Eye Sclera/Conjunctiva - Bilateral-No scleral icterus.  Chest and Lung Exam Chest and lung exam reveals -quiet, even and easy respiratory effort with no use of accessory muscles. Inspection Chest Wall - Normal. Back - normal.  Breast - Did not examine.  Cardiovascular Cardiovascular examination reveals -normal pedal pulses bilaterally. Note: regular rate and rhythm  Abdomen Inspection-Inspection Normal. Palpation/Percussion Palpation and Percussion of the abdomen reveal - Soft, Non Tender, No Rebound tenderness, No Rigidity (guarding) and No hepatosplenomegaly. Auscultation Auscultation of the abdomen reveals - Bowel sounds normal. Note: Laparoscopic cholecystectomy scars are healing well. The abdomen is soft, nontender, and nondistended.   Peripheral Vascular Upper Extremity Inspection - Bilateral - Normal - No Clubbing, No Cyanosis, No Edema, Pulses Intact. Lower Extremity Palpation - Edema - Bilateral - No edema.  Neurologic Neurologic evaluation reveals -alert and oriented x 3 with no impairment of recent or remote memory. Mental Status-Normal.  Musculoskeletal Global Assessment -Note: no gross deformities.  Normal Exam - Left-Upper Extremity Strength Normal and Lower Extremity Strength Normal. Normal Exam - Right-Upper Extremity Strength Normal and Lower Extremity Strength  Normal.  Lymphatic Head & Neck  General Head &  Neck Lymphatics: Bilateral - Description - Normal. Axillary  General Axillary Region: Bilateral - Description - Normal. Tenderness - Non Tender.    Assessment & Plan [name]; 07/12/2014 10:42 AM) GALLBLADDER CANCER (156.0  C23) Impression: The patient is a very robust 75 year old do think he is able to undergo surgery. We discussed that he would need to at least have a partial hepatectomy and bile duct reconstruction to achieve negative margins. He may require a formal right hepatectomy. Since he cannot get an MRI, I have discussed with radiology the best options. They recommended a triple phase CT since we are mainly looking for smaller volume disease. I also ordered a chest CT. We are making a referral to oncology and also to genetics. I will schedule him at the first available opportunity as long as he does not have any evidence of left-sided disease in the liver or metastatic disease.  I discussed the risk of bleeding, infection, damage to adjacent structures, possible need for additional procedures, heart or lung issues, and blood clots. He wants to proceed at the first available opportunity.  The evaluation, examination, counseling, and inclination of care to approximately 40 minutes. Greater than 50% of this spent counseling. Current Plans  Referred to Oncology, for evaluation and follow up (Oncology). Referred to Genetic Counseling, for evaluation and follow up [employer]). Whatever the operative findings, I will discuss the case with your family after we are done in the operating room. I will talk to you in the next few days when you are more awake. I will see you in the hospital every weekday that I am not out of town. My partners help see patients on the weekends and if I am out of town.  WHAT HAPPENS AFTER SURGERY: After surgery, you will go first to the recovery room, then to the ICU for careful monitoring. It is  important that I am able to know your vital signs and urine output to see how you are doing. Depending on many factors, you may be in the ICU overnight, or for several days. You will have many tubes and lines in place which is standard for this operation. You will have several IVs, including possibly a central line in your chest or neck. You will likely have an arterial line in your wrist. You will have a tube in your nose for 4-5 days in order to suction out the stomach and liver secretions to help the surgical connections heal. YOU WILL NOT BE ABLE TO EAT FOR AT LEAST 4-5 DAYS AFTER SURGERY. You will have a catheter in your bladder. On your abdomen, you will have several surgical drains and possibly a pain pump with numbing medicine. You will have compression stockings on your legs to decrease the risk of blood clots.  Sometimes anesthesia makes a decision that you may need to be left on the breathing machine for a period after surgery. This may be based on your overall health status, or events during surgery.  We will address your pain in several ways. We will use an IV pain pump called a "PCA," or Patient Controlled Analgesia. This allows you to press a button and immediately receive a dose of pain medication without waiting for a nurse. We also use IV Tylenol and sometimes IV Toradol which is similar to ibuprofen. You may also have a pump with numbing medicine delivered directly to your incision. I use doses and medications that work for the majority of people, but you may need an  adjustment to the dose or type of medicine if your pain is not adequately controlled. Your throat may be sore, in which case you may need a throat spray or lozenges.  We will ask you to get out of bed the day after surgery in order to maximize your chances of not having complications. Your risk of pneumonia and blood clots is lower with walking and sitting in the chair. We will also ask you to perform breathing exercises. We  will also ask to you walk in your room and in the halls for the above reasons, but also in order for you to keep up your strength.  EATING: We will usually start you on clear liquids in around 2 days if your bowel function seems to have returned. We advance your diet slowly to make sure you are tolerating each step. All patients do not have a normal appetite when they go home and usually have to take 2-4 cans of nutritional supplement per day while this is improving. Most patients also find that their taste buds do not seem the same right after surgery, and this can continue into the time of possible post operative chemotherapy and radiation. Some patients develop diabetes and will need assistance from a primary care doctor for medication.   GOING HOME! Usually you are able to go home in 5-10 days, depending on whether or not complications happen and what is going on with your overall health status. If you have more health problems or if you have limited help at home, the therapists and nurses may recommend a temporary rehab or nursing facility to help you get back on your feet before you go home. These decisions would be made while you are in the hospital with the assistance of a social worker or case manager.  Please bring all insurance/disability forms to our office for the staff to fill out   POSSIBLE COMPLICATIONS This is a very extensive operation and includes complications listed below: Bleeding Infection and possible wound complications such as hernia Damage to adjacent structures Leak of bile from the surface of the liver Possible need for other procedures, such as abscess drains in radiology or endoscopy. Possible prolonged hospital stay MOST PATIENTS' ENERGY LEVEL IS NOT BACK TO NORMAL FOR AT LEAST 4-6 MONTHS. OLDER PATIENTS MAY FEEL WEAK FOR LONGER PERIODS OF TIME. Difficulty with eating or post operative nausea (around 30%) Possible early recurrence of cancer Possible  complications of your medical problems such as heart disease or arrhythmias. Death (less than 2%)  All possible complications are not listed, just the most common.  FURTHER INFORMATION? Please ask questions if you find something that we did not discuss in the office and would like more information. If you would like another appointment if you have many questions or if your family members would like to come as well, please contact the office.  IF YOU ARE TAKING ASPIRIN, PLAVIX, COUMADIN, OR OTHER BLOOD THINNERS, LET us KNOW IMMEDIATELY SO WE CAN CONTACT YOUR PRESCRIBING HEALTH CARE PROVIDER TO HOLD THE MEDICATION FOR 5-7 DAYS BEFORE SURGERY    Signed by [name], MD (07/12/2014 10:43 AM)

## 2014-08-27 ENCOUNTER — Other Ambulatory Visit: Payer: Medicare Other

## 2014-08-27 ENCOUNTER — Encounter (HOSPITAL_COMMUNITY): Payer: Self-pay | Admitting: General Surgery

## 2014-08-27 NOTE — Telephone Encounter (Signed)
Mailed updated schedule to pt after chemo has been added.... KJ

## 2014-08-28 ENCOUNTER — Ambulatory Visit
Admission: RE | Admit: 2014-08-28 | Discharge: 2014-08-28 | Disposition: A | Discharge status: Home / Self Care | Payer: Medicare Other | Source: Ambulatory Visit | Attending: Radiation Oncology | Admitting: Radiation Oncology

## 2014-08-28 ENCOUNTER — Encounter: Payer: Self-pay | Admitting: Radiation Oncology

## 2014-08-28 VITALS — BP 130/60 | HR 87 | Temp 98.2°F | Resp 16 | Ht 73.0 in | Wt 176.9 lb

## 2014-08-28 DIAGNOSIS — Z800 Family history of malignant neoplasm of digestive organs: Secondary | ICD-10-CM | POA: Insufficient documentation

## 2014-08-28 DIAGNOSIS — Z803 Family history of malignant neoplasm of breast: Secondary | ICD-10-CM | POA: Diagnosis not present

## 2014-08-28 DIAGNOSIS — C779 Secondary and unspecified malignant neoplasm of lymph node, unspecified: Secondary | ICD-10-CM | POA: Diagnosis not present

## 2014-08-28 DIAGNOSIS — I10 Essential (primary) hypertension: Secondary | ICD-10-CM | POA: Insufficient documentation

## 2014-08-28 DIAGNOSIS — Z510 Encounter for antineoplastic radiation therapy: Secondary | ICD-10-CM | POA: Insufficient documentation

## 2014-08-28 DIAGNOSIS — Z7982 Long term (current) use of aspirin: Secondary | ICD-10-CM | POA: Insufficient documentation

## 2014-08-28 DIAGNOSIS — J449 Chronic obstructive pulmonary disease, unspecified: Secondary | ICD-10-CM | POA: Diagnosis not present

## 2014-08-28 DIAGNOSIS — Z79899 Other long term (current) drug therapy: Secondary | ICD-10-CM | POA: Diagnosis not present

## 2014-08-28 DIAGNOSIS — Z808 Family history of malignant neoplasm of other organs or systems: Secondary | ICD-10-CM | POA: Insufficient documentation

## 2014-08-28 DIAGNOSIS — C23 Malignant neoplasm of gallbladder: Secondary | ICD-10-CM | POA: Insufficient documentation

## 2014-08-28 DIAGNOSIS — Z87891 Personal history of nicotine dependence: Secondary | ICD-10-CM | POA: Insufficient documentation

## 2014-08-28 DIAGNOSIS — Z9049 Acquired absence of other specified parts of digestive tract: Secondary | ICD-10-CM | POA: Insufficient documentation

## 2014-08-28 HISTORY — DX: Allergy, unspecified, initial encounter: T78.40XA

## 2014-08-28 HISTORY — DX: Unspecified malignant neoplasm of skin, unspecified: C44.90

## 2014-08-28 NOTE — Progress Notes (Signed)
Please see the Nurse Progress Note in the MD Initial Consult Encounter for this patient. 

## 2014-08-29 ENCOUNTER — Encounter: Payer: Self-pay | Admitting: Radiation Oncology

## 2014-08-29 NOTE — Progress Notes (Signed)
Radiation Oncology         [phone] ________________________________  Name: Ronnie Nicholson MRN: [id number]  Date: 08/28/2014  DOB: Feb 18, 1941  GY:BWLSLH,T S, MD  Ladell Pier, MD     REFERRING PHYSICIAN: Ladell Pier, MD   DIAGNOSIS: The encounter diagnosis was Gallbladder cancer.  Gallbladder cancer   Staging form: Gallbladder, AJCC 7th Edition     Clinical: Stage IIIA (T3, N0, M0) - Signed by Ladell Pier, MD on 07/16/2014     Pathologic: Stage IIIB (T3, N1, cM0) - Signed by Ladell Pier, MD on 08/22/2014     HISTORY OF PRESENT ILLNESS::Ronnie JACOBO Nicholson is a 74 y.o. male who is seen for an initial consultation visit regarding the patient's diagnosis of cancer of the gallbladder.  The patient presented with some right upper quadrant pain after fatty meals. The patient was seen for evaluation for gallstones and was taken to surgery for cholecystectomy. Final pathology from this specimen from 06/24/2014 however revealed poorly differentiated invasive adenocarcinoma measuring 2.8 cm. The adenocarcinoma showed direct extension into the adjacent hepatic parenchyma and was present at the inked margin. Adenocarcinoma was also present at the cystic duct margin. Lymphovascular invasion was identified. One benign lymph node was noted. This represented a T3 N0 tumor at that time.  The patient was subsequently taken back to the operating room for a second resection given the positive margins on the initial surgery.  This revealed focal residual invasive carcinoma. The inked resection margin from the liver was negative. The common bile duct did not reveal any further evidence of malignancy. 4 lymph nodes were negative for metastatic carcinoma but one additional lymph node was positive for metastatic adenocarcinoma with extranodal extension. 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  The patient has seen Dr. Benay Spice and medical  oncology for consideration of adjuvant treatment. He does feel that the patient is at high risk of local failure. He therefore has started the patient on chemotherapy and I have been asked to see the patient today for consideration of postoperative radiation treatment.     PREVIOUS RADIATION THERAPY: No   PAST MEDICAL HISTORY:  has a past medical history of Arthritis; Hyperlipidemia; History of colon polyps; Symptomatic PVCs; COPD (chronic obstructive pulmonary disease); Left hydrocele; Bilateral carotid artery stenosis; Mild aortic valve stenosis; Wears glasses; Skin abrasion; Family history of colon cancer; Family history of breast cancer; Hypertension; Heart murmur; HOH (hard of hearing); Allergy; Skin cancer; and Cancer (06/24/14).     PAST SURGICAL HISTORY: Past Surgical History  Procedure Laterality Date  . Stapedectomy Bilateral 1965 left, 1981 right  . Shoulder surgery Right 2004    rotator cuff  . Transthoracic echocardiogram  03-20-2013  DR WALL    GRADE I DIASTOLIC DYSFUNCTION/  EF 65-70%/  MILD AORTIC STENOSIS WITH STABLE GRADIENTS  . Tonsillectomy  1962  . Hydrocele excision Left 05/10/2013    Procedure: HYDROCELECTOMY ADULT;  Surgeon: Franchot Gallo, MD;  Location: [facility];  Service: Urology;  Laterality: Left;  . Colonoscopy    . Cholecystectomy N/A 06/24/2014    Procedure: LAPAROSCOPIC CHOLECYSTECTOMY;  Surgeon: Coralie Keens, MD;  Location: [place];  Service: General;  Laterality: N/A;  . Open partial hepatectomy [83]  08/01/2014    Procedure: OPEN PARTIAL HEPATECTOMY, INTRA- OPERATIVE ULTRASOUND, COMMON BILE DUCT RESECTION, ROUX-EN-Y HEPATICOJEJUNOSTOMY;  Surgeon: [name], MD;  Location: [facility];  Service: General;;  . Portacath placement N/A 08/26/2014  Procedure: INSERTION PORT-A-CATH;  Surgeon: [name], MD;  Location: [facility];  Service: General;  Laterality: N/A;     FAMILY HISTORY: family history includes Brain cancer  (age of onset: 79) in his brother; Breast cancer in his cousin and paternal aunt; Breast cancer (age of onset: 7) in his sister; Cancer in his maternal uncle and paternal uncle; Cancer (age of onset: 38) in his brother; Colon cancer in his brother; Colon cancer (age of onset: 38) in his brother; Colon cancer (age of onset: 60) in his father; Melanoma (age of onset: 57) in his brother.   SOCIAL HISTORY:  reports that he quit smoking about 31 years ago. His smoking use included Cigarettes. He has a 10 pack-year smoking history. He has never used smokeless tobacco. He reports that he does not drink alcohol or use illicit drugs.   ALLERGIES: Lodine; Flexeril; and Tetracyclines & related   MEDICATIONS:  Current Outpatient Prescriptions  Medication Sig Dispense Refill  . aspirin EC 81 MG tablet Take 81 mg by mouth daily.    . metoprolol tartrate (LOPRESSOR) 25 MG tablet Take 25 mg by mouth 2 (two) times daily.    . pantoprazole (PROTONIX) 20 MG tablet Take 20 mg by mouth daily.    Marland Kitchen acetaminophen (TYLENOL) 500 MG tablet Take 2 tablets (1,000 mg total) by mouth 3 (three) times daily. (Patient not taking: Reported on 08/22/2014) 30 tablet 0  . pravastatin (PRAVACHOL) 20 MG tablet Take 1 tablet (20 mg total) by mouth daily. (Patient not taking: Reported on 07/23/2014) 90 tablet 1   No current facility-administered medications for this encounter.     REVIEW OF SYSTEMS:  A 15 point review of systems is documented in the electronic medical record. This was obtained by the nursing staff. However, I reviewed this with the patient to discuss relevant findings and make appropriate changes.  Pertinent items are noted in HPI.    PHYSICAL EXAM:  height is 6\' 1"  (1.854 m) and weight is 176 lb 14.4 oz (80.241 kg). His oral temperature is 98.2 F (36.8 C). His blood pressure is 130/60 and his pulse is 87. His respiration is 16 and oxygen saturation is 100%.   ECOG = 1  0 - Asymptomatic (Fully active, able to  carry on all predisease activities without restriction)  1 - Symptomatic but completely ambulatory (Restricted in physically strenuous activity but ambulatory and able to carry out work of a light or sedentary nature. For example, light housework, office work)  2 - Symptomatic, <50% in bed during the day (Ambulatory and capable of all self care but unable to carry out any work activities. Up and about more than 50% of waking hours)  3 - Symptomatic, >50% in bed, but not bedbound (Capable of only limited self-care, confined to bed or chair 50% or more of waking hours)  4 - Bedbound (Completely disabled. Cannot carry on any self-care. Totally confined to bed or chair)  5 - Death   Eustace Pen MM, Creech RH, Tormey DC, et al. [phone]). "Toxicity and response criteria of the [facility]". [place] Oncol. 5 (6): 649-55  General: Well-developed, in no acute distress HEENT: Normocephalic, atraumatic; oral cavity clear Neck: Supple without any lymphadenopathy Cardiovascular: Regular rate and rhythm Respiratory: Clear to auscultation bilaterally GI: Soft, nontender, normal bowel sounds, well healing surgical incision present Extremities: No edema present Neuro: No focal deficits     LABORATORY DATA:  Lab Results  Component Value Date   WBC  8.4 08/22/2014   HGB 11.7* 08/22/2014   HCT 37.1* 08/22/2014   MCV 91.1 08/22/2014   PLT 387 08/22/2014   Lab Results  Component Value Date   NA 140 08/22/2014   K 4.7 08/22/2014   CL 101 08/06/2014   CO2 27 08/22/2014   Lab Results  Component Value Date   ALT 38 08/22/2014   AST 20 08/22/2014   ALKPHOS 163* 08/22/2014   BILITOT 0.29 08/22/2014      RADIOGRAPHY: Ct Abdomen Pelvis W Contrast  08/19/2014   CLINICAL DATA:  Gallbladder adenocarcinoma, status post cholecystectomy on 06/24/2014 and partial hepatectomy on 07/22/2014  EXAM: CT ABDOMEN AND PELVIS WITH CONTRAST  TECHNIQUE: Multidetector CT imaging of the abdomen  and pelvis was performed using the standard protocol following bolus administration of intravenous contrast.  CONTRAST:  100 mL Isovue 300 IV  COMPARISON:  CT chest/abdomen dated 07/17/2014. CT abdomen pelvis dated 05/24/2014.  FINDINGS: Lower chest: Mild scarring in the lateral right lower lobe (series 4/image 12). 7 mm subpleural opacity in the left upper lobe (series 4/image 2), unchanged. Additional 3 mm subpleural nodular opacity in the lingula (series 4/ image 11), unchanged.  Hepatobiliary: Liver is status post a central partial hepatectomy and cholecystectomy with indwelling left biliary stent/drain. Associated pneumobilia.  1.5 x 2.4 cm fluid and gas collection in the surgical bed/ gallbladder fossa, new since partial hepatectomy.  No intrahepatic or extrahepatic ductal dilatation.  Pancreas: Mild enlargement of the pancreatic uncinate process anteriorly (series 3/ image 37), new, correlate for acute pancreatitis.  Spleen: Within normal limits.  Adrenals/Urinary Tract: 1.8 x 1.3 cm right adrenal nodule (series 3/ image 22), indeterminate.  Left adrenal gland is within normal limits.  Bilateral renal sinus cysts, left greater than right. Kidneys are otherwise within normal limits. No hydronephrosis.  Bladder is within normal limits.  Stomach/Bowel: Stomach is unremarkable.  No evidence of bowel obstruction.  Vascular/Lymphatic: Atherosclerotic calcifications of the abdominal aorta and branch vessels.  No suspicious abdominopelvic lymphadenopathy.  Reproductive: Prostate is notable for dystrophic calcifications.  Other: No abdominopelvic ascites.  Musculoskeletal: Degenerative changes of the visualized thoracolumbar spine.  IMPRESSION: Postsurgical changes status post cholecystectomy with central partial hepatectomy and indwelling left biliary stent/drain. 1.5 x 2.4 cm fluid collection in the surgical bed/gallbladder fossa, likely postsurgical.  No findings specific for recurrent or metastatic disease.   Enlargement of the anterior pancreatic uncinate process, new, correlate for acute pancreatitis.  Stable 1.8 cm right adrenal nodule, statistically likely reflecting a benign adrenal adenoma, although indeterminate. Consider MRI abdomen without contrast for further characterization as clinically warranted.  Stable subpleural left upper lobe nodules, likely benign given subpleural location, attention on follow-up suggested.   Electronically Signed   By: Julian Hy M.D.   On: 08/19/2014 15:46   Dg Chest Port 1 View  08/26/2014   CLINICAL DATA:  Port-A-Cath placement  EXAM: PORTABLE CHEST - 1 VIEW  COMPARISON:  Study obtained earlier in the day  FINDINGS: Port-A-Cath tip is in the superior vena cava, essentially unchanged compared to study obtained earlier in the day. No pneumothorax. There is subsegmental atelectasis in the right base laterally. Lungs are otherwise clear. Heart size and pulmonary vascularity are normal. No adenopathy. No bone lesions.  IMPRESSION: Port-A-Cath tip in superior vena cava. No pneumothorax. Lungs are clear except for mild right base atelectasis laterally. No change in cardiac silhouette. Note that the skin fold is no longer appreciable on the left side.   Electronically Signed   By:  Lowella Grip III M.D.   On: 08/26/2014 09:08   Dg Chest Port 1 View  08/26/2014   CLINICAL DATA:  Status post central line insertion, history of COPD and gallbladder malignancy  EXAM: PORTABLE CHEST - 1 VIEW  COMPARISON:  PA and lateral chest x-ray of May 10, 2013 and CT scan of the chest of July 17, 2014  FINDINGS: The right lung is well-expanded. There is no pneumothorax. There is no significant pleural effusion. On the left there is a double density along the periphery of the lung with evidence of lung markings distal to the line. There is no pleural effusion. There is no mediastinal shift. The heart and pulmonary vascularity are normal. The Port-A-Cath appliance tip projects over  the middle third of the SVC.  IMPRESSION: The Port-A-Cath appliance is in appropriate position. There is a probable prominent skin fold on the left or other overlying structure over the hemithorax which is atypical in appearance for a pneumothorax. On the right there is no pneumothorax or pleural effusion. A repeat chest x-ray is recommended to reassess the left hemithorax especially if an initial attempt was made to place the Port-A-Cath on the left.  These results were called by telephone at the time of interpretation on 08/26/2014 at 8:40 am to Gary Fleet, RN,, who verbally acknowledged these results.   Electronically Signed   By: David  Martinique   On: 08/26/2014 08:43   Dg Fluoro Guide Cv Line-no Report  08/26/2014   CLINICAL DATA:    FLOURO GUIDE CV LINE  Fluoroscopy was utilized by the requesting physician.  No radiographic  interpretation.        IMPRESSION:  The patient has undergone resection and a second resection for what ultimately was a stage IIIB adenocarcinoma of the gallbladder. The patient does have high risk features for local failure including extension into the liver, initial positive margins, and lymph node involvement with extranodal extension. I believe that the patient is a good candidate for adjuvant radiation treatment in conjunction with chemotherapy.    I therefore discussed with the patient the potential benefit and role of a course of adjuvant radiation treatment. We discussed the improvement in local control. Discussed that treatment for this tumor is largely based on an analogous situations for other tumors where there is more data. All of the patient's questions were answered.   PLAN: The patient is being planned to begin initial chemotherapy. This can be followed with chemoradiation treatment. I will have our staff contact the patient to schedule follow-up/simulation at the appropriate time.   I spent 60 minutes face to face with the patient and more than 50% of that  time was spent in counseling and/or coordination of care.    ________________________________   Jodelle Gross, MD, PhD   **Disclaimer: This note was dictated with voice recognition software. Similar sounding words can inadvertently be transcribed and this note may contain transcription errors which may not have been corrected upon publication of note.**

## 2014-08-30 ENCOUNTER — Telehealth: Payer: Self-pay | Admitting: Genetic Counselor

## 2014-08-30 ENCOUNTER — Telehealth: Payer: Self-pay | Admitting: *Deleted

## 2014-08-30 ENCOUNTER — Encounter: Payer: Self-pay | Admitting: Genetic Counselor

## 2014-08-30 DIAGNOSIS — Z1379 Encounter for other screening for genetic and chromosomal anomalies: Secondary | ICD-10-CM | POA: Insufficient documentation

## 2014-08-30 NOTE — Telephone Encounter (Signed)
LM on VM that we had test results back and to please call back to discuss.

## 2014-08-30 NOTE — Telephone Encounter (Signed)
CALLED PATIENT TO INFORM OF [facility]  APPT. FOR 09-25-14, SPOKE WITH PATIENT AND HE IS AWARE OF THIS APPT.

## 2014-09-01 ENCOUNTER — Other Ambulatory Visit: Payer: Self-pay | Admitting: Oncology

## 2014-09-02 ENCOUNTER — Telehealth: Payer: Self-pay | Admitting: *Deleted

## 2014-09-02 ENCOUNTER — Other Ambulatory Visit: Payer: Self-pay | Admitting: *Deleted

## 2014-09-02 ENCOUNTER — Telehealth: Payer: Self-pay | Admitting: Genetic Counselor

## 2014-09-02 DIAGNOSIS — C23 Malignant neoplasm of gallbladder: Secondary | ICD-10-CM

## 2014-09-02 MED ORDER — PROCHLORPERAZINE MALEATE 10 MG PO TABS: 10.0000 mg | ORAL_TABLET | Freq: Four times a day (QID) | ORAL | Status: DC | PRN

## 2014-09-02 MED ORDER — LIDOCAINE-PRILOCAINE 2.5-2.5 % EX CREA: TOPICAL_CREAM | CUTANEOUS | Status: DC

## 2014-09-02 NOTE — Telephone Encounter (Signed)
Voice mail from wife that 1st chemo is Thursday. Needs EMLA cream called in. Forwarded message to collaborative nurse voice mail.

## 2014-09-02 NOTE — Telephone Encounter (Signed)
Patient's wife called for genetic test results.  I called back and got VM.  LVMM that results were back and to please call after 3 PM.

## 2014-09-03 ENCOUNTER — Telehealth: Payer: Self-pay | Admitting: Genetic Counselor

## 2014-09-03 NOTE — Telephone Encounter (Signed)
Revealed that patient has a BRCA1 mutation.  Discussed that this result has implications for their family, and tried to schedule an appointment.  Ronnie Nicholson will CB to schedule a time to come back for genetic counseling.

## 2014-09-04 ENCOUNTER — Other Ambulatory Visit: Payer: Self-pay | Admitting: *Deleted

## 2014-09-04 ENCOUNTER — Telehealth: Payer: Self-pay | Admitting: Oncology

## 2014-09-04 NOTE — Telephone Encounter (Signed)
Per 03/30 POF, labs cancelled for 03/31 per MD..... KJ

## 2014-09-05 ENCOUNTER — Encounter: Payer: Self-pay | Admitting: Genetic Counselor

## 2014-09-05 ENCOUNTER — Other Ambulatory Visit: Payer: Medicare Other

## 2014-09-05 ENCOUNTER — Ambulatory Visit (HOSPITAL_BASED_OUTPATIENT_CLINIC_OR_DEPARTMENT_OTHER): Payer: Medicare Other

## 2014-09-05 ENCOUNTER — Ambulatory Visit (HOSPITAL_BASED_OUTPATIENT_CLINIC_OR_DEPARTMENT_OTHER): Payer: Self-pay | Admitting: Genetic Counselor

## 2014-09-05 DIAGNOSIS — Z800 Family history of malignant neoplasm of digestive organs: Secondary | ICD-10-CM

## 2014-09-05 DIAGNOSIS — C23 Malignant neoplasm of gallbladder: Secondary | ICD-10-CM

## 2014-09-05 DIAGNOSIS — Z5111 Encounter for antineoplastic chemotherapy: Secondary | ICD-10-CM

## 2014-09-05 DIAGNOSIS — Z1509 Genetic susceptibility to other malignant neoplasm: Principal | ICD-10-CM

## 2014-09-05 DIAGNOSIS — Z803 Family history of malignant neoplasm of breast: Secondary | ICD-10-CM

## 2014-09-05 DIAGNOSIS — Z8042 Family history of malignant neoplasm of prostate: Secondary | ICD-10-CM

## 2014-09-05 DIAGNOSIS — Z8052 Family history of malignant neoplasm of bladder: Secondary | ICD-10-CM

## 2014-09-05 DIAGNOSIS — Z808 Family history of malignant neoplasm of other organs or systems: Secondary | ICD-10-CM

## 2014-09-05 DIAGNOSIS — Z8489 Family history of other specified conditions: Secondary | ICD-10-CM

## 2014-09-05 DIAGNOSIS — Z1501 Genetic susceptibility to malignant neoplasm of breast: Secondary | ICD-10-CM

## 2014-09-05 MED ORDER — SODIUM CHLORIDE 0.9 % IJ SOLN
10.0000 mL | INTRAMUSCULAR | Status: DC | PRN
  Administered 2014-09-05: 10 mL
  Filled 2014-09-05: qty 10

## 2014-09-05 MED ORDER — SODIUM CHLORIDE 0.9 % IV SOLN
800.0000 mg/m2 | Freq: Once | INTRAVENOUS | Status: AC
  Administered 2014-09-05: 1634 mg via INTRAVENOUS
  Filled 2014-09-05: qty 42.98

## 2014-09-05 MED ORDER — PROCHLORPERAZINE MALEATE 10 MG PO TABS
10.0000 mg | ORAL_TABLET | Freq: Once | ORAL | Status: AC
  Administered 2014-09-05: 10 mg via ORAL

## 2014-09-05 MED ORDER — SODIUM CHLORIDE 0.9 % IV SOLN
Freq: Once | INTRAVENOUS | Status: AC
  Administered 2014-09-05: 11:00:00 via INTRAVENOUS

## 2014-09-05 MED ORDER — PROCHLORPERAZINE MALEATE 10 MG PO TABS
ORAL_TABLET | ORAL | Status: AC
  Filled 2014-09-05: qty 1

## 2014-09-05 MED ORDER — HEPARIN SOD (PORK) LOCK FLUSH 100 UNIT/ML IV SOLN
500.0000 [IU] | Freq: Once | INTRAVENOUS | Status: AC | PRN
  Administered 2014-09-05: 500 [IU]
  Filled 2014-09-05: qty 5

## 2014-09-05 NOTE — Progress Notes (Addendum)
REFERRING PROVIDER: Mikey Kirschner, MD [place], [place] [address]   Julieanne Manson, MD  [name], MD  PRIMARY PROVIDER:  Rubbie Battiest, MD  PRIMARY REASON FOR VISIT:  1. BRCA1 positive   2. Gallbladder cancer   3. Family history of colon cancer   4. Family history of breast cancer   5. Family history of brain tumor   6. Family history of melanoma   7. Family history of bladder cancer   8. Family history of prostate cancer      HISTORY OF PRESENT ILLNESS:   Mr. Hellmer, a 74 y.o. male, was seen with his wife to discuss his genetic test result that found a BRCA1 mutation and how his personal and family history of cancer plays into his risk.  In 2016, at the age of 1, Mr. Deady was diagnosed with cancer of the gallbladder. This was treated with surgery, and his first infusion is scheduled for today.  Genetic testing was performed based on his personal and family history of cancer.  His family history of cancer is vast, and somewhat suggestive of Lynch syndrome, although due to the multiple different forms of cancer a 70 gene cancer panel was ordered from [facility].  Mr. Esper was found to have a likely pathogenic variant in BRCA1 called c.5363C>T, and two VUSs - one in PTCH1 c.3541T>C (associated with Gorlin syndrome) and WRN c.2596G>A (associated with recessive Werner syndrome).    CANCER HISTORY:   No history exists.     Past Medical History  Diagnosis Date  . Arthritis   . Hyperlipidemia   . History of colon polyps   . Symptomatic PVCs   . COPD (chronic obstructive pulmonary disease)   . Left hydrocele   . Bilateral carotid artery stenosis     PER DUPLEX 11-17-2012---  RICA 7-09%/  LICA  64-38%  . Mild aortic valve stenosis   . Wears glasses   . Skin abrasion     right temple, left hand areas removed by dermatology last week, areas healing  . Family history of colon cancer   . Family history of breast cancer   . Hypertension   . Heart  murmur   . HOH (hard of hearing)     wears hearing aids  . Allergy   . Skin cancer     basal cell carcinoma  . Cancer 06/24/14    gallbladder cancer  . BRCA1 positive     Past Surgical History  Procedure Laterality Date  . Stapedectomy Bilateral 1965 left, 1981 right  . Shoulder surgery Right 2004    rotator cuff  . Transthoracic echocardiogram  03-20-2013  DR WALL    GRADE I DIASTOLIC DYSFUNCTION/  EF 65-70%/  MILD AORTIC STENOSIS WITH STABLE GRADIENTS  . Tonsillectomy  1962  . Hydrocele excision Left 05/10/2013    Procedure: HYDROCELECTOMY ADULT;  Surgeon: Franchot Gallo, MD;  Location: [facility];  Service: Urology;  Laterality: Left;  . Colonoscopy    . Cholecystectomy N/A 06/24/2014    Procedure: LAPAROSCOPIC CHOLECYSTECTOMY;  Surgeon: Coralie Keens, MD;  Location: [facility];  Service: General;  Laterality: N/A;  . Open partial hepatectomy [83]  08/01/2014    Procedure: OPEN PARTIAL HEPATECTOMY, INTRA- OPERATIVE ULTRASOUND, COMMON BILE DUCT RESECTION, ROUX-EN-Y HEPATICOJEJUNOSTOMY;  Surgeon: [name], MD;  Location: [facility];  Service: General;;  . Portacath placement N/A 08/26/2014    Procedure: INSERTION PORT-A-CATH;  Surgeon: [name], MD;  Location: [place];  Service: General;  Laterality: N/A;    History   Social History  . Marital Status: Married    Spouse Name: Thayer Headings  . Number of Children: 1  . Years of Education: N/A   Occupational History  . retired     08-2003   Social History Main Topics  . Smoking status: Former Smoker -- 1.00 packs/day for 10 years    Types: Cigarettes    Quit date: 06/18/1983  . Smokeless tobacco: Never Used  . Alcohol Use: No  . Drug Use: No  . Sexual Activity: Not on file   Other Topics Concern  . None   Social History Narrative     FAMILY HISTORY:  We obtained a detailed, 4-generation family history.  Significant diagnoses are listed below: Family History  Problem Relation Age of  Onset  . Colon cancer Father 58  . Colon cancer Brother 42  . Colon cancer Brother   . Breast cancer Sister 86  . Cancer Maternal Uncle     NOS  . Breast cancer Paternal Aunt     dx <50  . Cancer Paternal Uncle     NOS  . Cancer Brother 70    cancer in a gland in his neck  . Brain cancer Brother 15  . Melanoma Brother 78  . Breast cancer Cousin     6 paternal first cousins    Mr. Schwager has five brothers and two sisters, all but one sister have had cancer. Mr. Manuele father had colon cancer at 32. His father had three brothers and three sisters. One sister had breast cancer and a brother had an unknown cancer. Six paternal first cousins (male) had breast cancer. Mr. Bowland has one maternal cousin with prostate and bladder cancer. Patient's ancestors are of Caucasian descent. There is no reported Ashkenazi Jewish ancestry. There is no known consanguinity.   GENETIC COUNSELING ASSESSMENT: PHAROAH GOGGINS is a 74 y.o. male with a personal history of Gallbladder cancer, a family history of multiple cancers and a likely pathogenic mutation in BRCA1 which is causative of a hereditary breast and ovarian cancer syndrome (HBOC) and predisposition to cancer. We, therefore, discussed and recommended the following at today's visit.   DISCUSSION: We reviewed the characteristics, features and inheritance patterns of hereditary cancer syndromes. We discussed that BRCA1 mutations increase the risk for several cancers including breast and ovarian cancer in women, breast and prostate cancer in men, and pancreatic cancer in both men and women.  In some families, other cancers can be seen including uterine, colon and stomach cancer.  Gallbladder cancer has also been reported in BRCA1 mutations, but it is not a typical cancer.  We discussed the NCCN guidelines medical management for BRCA mutations.  This includes: For women: 1. Breast cancer screening including mammography and breast MRI starting  between the ages of 6-35. 2. Monthly self breast exams, and 1-2x yearly clinical breast exams. 3. To reduce the risk for a future breast cancer, a discussion with a surgeon about prophylactic mastectomy (removal of both breasts)   4. To reduce the risk for ovarian cancer, we recommend women have a prophylactic bilateral salpingo-oophorectomy (removal of ovaries and fallopian tubes) when childbearing is completed.   5. For women who are planning to have children, screening with CA-125 blood tests and transvaginal ultrasounds can be done twice per year.  However, these tests have not been shown to detect ovarian cancer at an early stage.  For men who harbor BRCA1 mutations: 1.  We recommend that your physician perform a clinical breast exam yearly.   2. [employer] (NCCN) guidelines recommend that prostate screening to start at age 46 rather than age 39.    Genetic testing did detect two Variants of Unknown Significance - one in the PTCH1 gene called c.3541T>C and another in the WRN gene called c.2596G>A. At this time, it is unknown if these variants are associated with an increased cancer risk or if this is a normal finding, but most variants such as this get reclassified to being inconsequential. It should not be used to make medical management decisions. With time, we suspect the lab will determine the significance of this variant, if any. If we do learn more about it, we will try to contact Mr. Acevedo to discuss it further. However, it is important to stay in touch with [place] periodically and keep the address and phone number up to date.   FAMILY MEMBERS: It is important that all of Mr. Kinnick's paternal relatives (both men and women) know of the presence of this gene mutation.  Women need to know that they may be at increased risk for breast and ovarian cancers.  Men are at increased risk for breast and prostate cancers.  Genetic testing can sort out who in the family is at risk  and who is not.  We recommended providing a copy of the actual test result so that family members can have targeted testing, rather than full BRCA mutation testing, which is less costly and more appropriate testing.  We would be happy to help meet with and coordinate genetic testing for any relative that is interested.  Mr. Nidiffer son is at 50% risk to have inherited the mutation. We recommend he has genetic testing for this same mutation, as identifying the presence of this mutation would allow him to also take advantage of risk-reducing measures.   PLAN: Mr. Stead will discuss genetic testing with his relatives.  He will let his primary care physician know about this test result so that he can be appropriately screened.  Lastly, we encouraged Mr. Reigel to remain in contact with cancer genetics annually so that we can continuously update the family history and inform him of any changes in cancer genetics and testing that may be of benefit for this family.   Mr.  Schriver questions were answered to his satisfaction today. Our contact information was provided should additional questions or concerns arise. Thank you for the referral and allowing [place] to share in the care of your patient.   Karen P. Florene Glen, [place], [facility] Certified Genetic Counselor [email or web address] phone: [phone]  The patient was seen for a total of 30 minutes in face-to-face genetic counseling.  This patient was discussed with Drs. Magrinat, Lindi Adie and/or Burr Medico who agrees with the above.    _______________________________________________________________________ For Office Staff:  Number of people involved in session: 2  Was an Intern/ student involved with case: no

## 2014-09-05 NOTE — Patient Instructions (Signed)
[facility] Discharge Instructions for Patients Receiving Chemotherapy  Today you received the following chemotherapy agents Gemzar  To help prevent nausea and vomiting after your treatment, we encourage you to take your nausea medication as prescribed   If you develop nausea and vomiting that is not controlled by your nausea medication, call the clinic.   BELOW ARE SYMPTOMS THAT SHOULD BE REPORTED IMMEDIATELY:  *FEVER GREATER THAN 100.5 F  *CHILLS WITH OR WITHOUT FEVER  NAUSEA AND VOMITING THAT IS NOT CONTROLLED WITH YOUR NAUSEA MEDICATION  *UNUSUAL SHORTNESS OF BREATH  *UNUSUAL BRUISING OR BLEEDING  TENDERNESS IN MOUTH AND THROAT WITH OR WITHOUT PRESENCE OF ULCERS  *URINARY PROBLEMS  *BOWEL PROBLEMS  UNUSUAL RASH Items with * indicate a potential emergency and should be followed up as soon as possible.  Feel free to call the clinic you have any questions or concerns. The clinic phone number is [phone].  Please show the CHEMO ALERT CARD at check-in to the Emergency Department and triage nurse.   

## 2014-09-06 ENCOUNTER — Telehealth: Payer: Self-pay | Admitting: *Deleted

## 2014-09-06 NOTE — Telephone Encounter (Signed)
This RN called patient and left message on home answering machine to call Ronnie Nicholson if he had any questions or concerns regarding his first chemotherapy.

## 2014-09-06 NOTE — Telephone Encounter (Signed)
-----   Message from Rosalie Gums, RN sent at 09/05/2014  3:17 PM EDT ----- Regarding: Dr.Sherrill First treatment of Gemzar today. 09/05/14

## 2014-09-07 ENCOUNTER — Emergency Department (HOSPITAL_COMMUNITY): Payer: Medicare Other

## 2014-09-07 ENCOUNTER — Emergency Department (HOSPITAL_COMMUNITY)
Admission: EM | Admit: 2014-09-07 | Discharge: 2014-09-07 | Disposition: A | Discharge status: Home / Self Care | Payer: Medicare Other | Attending: Emergency Medicine | Admitting: Emergency Medicine

## 2014-09-07 ENCOUNTER — Encounter (HOSPITAL_COMMUNITY): Payer: Self-pay | Admitting: Emergency Medicine

## 2014-09-07 DIAGNOSIS — Z79899 Other long term (current) drug therapy: Secondary | ICD-10-CM | POA: Diagnosis not present

## 2014-09-07 DIAGNOSIS — E785 Hyperlipidemia, unspecified: Secondary | ICD-10-CM | POA: Insufficient documentation

## 2014-09-07 DIAGNOSIS — J449 Chronic obstructive pulmonary disease, unspecified: Secondary | ICD-10-CM | POA: Insufficient documentation

## 2014-09-07 DIAGNOSIS — Z85828 Personal history of other malignant neoplasm of skin: Secondary | ICD-10-CM | POA: Insufficient documentation

## 2014-09-07 DIAGNOSIS — I10 Essential (primary) hypertension: Secondary | ICD-10-CM | POA: Insufficient documentation

## 2014-09-07 DIAGNOSIS — M1990 Unspecified osteoarthritis, unspecified site: Secondary | ICD-10-CM | POA: Insufficient documentation

## 2014-09-07 DIAGNOSIS — R109 Unspecified abdominal pain: Secondary | ICD-10-CM

## 2014-09-07 DIAGNOSIS — Z7982 Long term (current) use of aspirin: Secondary | ICD-10-CM | POA: Insufficient documentation

## 2014-09-07 DIAGNOSIS — Z87891 Personal history of nicotine dependence: Secondary | ICD-10-CM | POA: Insufficient documentation

## 2014-09-07 DIAGNOSIS — D015 Carcinoma in situ of liver, gallbladder and bile ducts: Secondary | ICD-10-CM | POA: Insufficient documentation

## 2014-09-07 DIAGNOSIS — R011 Cardiac murmur, unspecified: Secondary | ICD-10-CM | POA: Diagnosis not present

## 2014-09-07 DIAGNOSIS — Z86010 Personal history of colonic polyps: Secondary | ICD-10-CM | POA: Diagnosis not present

## 2014-09-07 DIAGNOSIS — R1011 Right upper quadrant pain: Secondary | ICD-10-CM | POA: Insufficient documentation

## 2014-09-07 LAB — COMPREHENSIVE METABOLIC PANEL
ALT: 137 U/L — ABNORMAL HIGH (ref 0–53)
AST: 187 U/L — ABNORMAL HIGH (ref 0–37)
Albumin: 4 g/dL (ref 3.5–5.2)
Alkaline Phosphatase: 238 U/L — ABNORMAL HIGH (ref 39–117)
Anion gap: 11 (ref 5–15)
BILIRUBIN TOTAL: 1 mg/dL (ref 0.3–1.2)
BUN: 15 mg/dL (ref 6–23)
CO2: 26 mmol/L (ref 19–32)
Calcium: 9 mg/dL (ref 8.4–10.5)
Chloride: 98 mmol/L (ref 96–112)
Creatinine, Ser: 0.83 mg/dL (ref 0.50–1.35)
GFR calc Af Amer: >90 mL/min (ref >90)
GFR, EST NON AFRICAN AMERICAN: 83 mL/min — AB (ref >90)
Glucose, Bld: 106 mg/dL — ABNORMAL HIGH (ref 70–99)
Potassium: 3.7 mmol/L (ref 3.5–5.1)
Sodium: 135 mmol/L (ref 135–145)
TOTAL PROTEIN: 7.5 g/dL (ref 6.0–8.3)

## 2014-09-07 LAB — CBC WITH DIFFERENTIAL/PLATELET
Basophils Absolute: 0 10*3/uL (ref 0.0–0.1)
Basophils Relative: 0 % (ref 0–1)
EOS ABS: 0.2 10*3/uL (ref 0.0–0.7)
Eosinophils Relative: 2 % (ref 0–5)
HCT: 39.8 % (ref 39.0–52.0)
HEMOGLOBIN: 12.9 g/dL — AB (ref 13.0–17.0)
Lymphocytes Relative: 10 % — ABNORMAL LOW (ref 12–46)
Lymphs Abs: 0.9 10*3/uL (ref 0.7–4.0)
MCH: 29.7 pg (ref 26.0–34.0)
MCHC: 32.4 g/dL (ref 30.0–36.0)
MCV: 91.7 fL (ref 78.0–100.0)
MONOS PCT: 2 % — AB (ref 3–12)
Monocytes Absolute: 0.2 10*3/uL (ref 0.1–1.0)
NEUTROS PCT: 86 % — AB (ref 43–77)
Neutro Abs: 8.1 10*3/uL — ABNORMAL HIGH (ref 1.7–7.7)
Platelets: 246 10*3/uL (ref 150–400)
RBC: 4.34 MIL/uL (ref 4.22–5.81)
RDW: 12.8 % (ref 11.5–15.5)
WBC: 9.4 10*3/uL (ref 4.0–10.5)

## 2014-09-07 LAB — LIPASE, BLOOD: Lipase: 27 U/L (ref 11–59)

## 2014-09-07 MED ORDER — BISACODYL 10 MG RE SUPP
20.0000 mg | Freq: Once | RECTAL | Status: AC
  Administered 2014-09-07: 20 mg via RECTAL
  Filled 2014-09-07: qty 2

## 2014-09-07 NOTE — ED Notes (Addendum)
Pt reports bilateral upper abd pain radiating to flank that started yesterday. Pain is intermittent with belching. Pt had first chemo for gallbladder ca on Thursday. No n/v/d. Pt reports pain is similar to when he was diagnosed with GERD.

## 2014-09-07 NOTE — Discharge Instructions (Signed)

## 2014-09-07 NOTE — ED Provider Notes (Signed)
CSN: [id number]     Arrival date & time 09/07/14  1733 History   First MD Initiated Contact with Patient 09/07/14 1754     Chief Complaint  Patient presents with  . Abdominal Pain  . cancer pt on chemo       HPI Pt reports bilateral upper abd pain radiating to flank that started yesterday. Pain is intermittent with belching. Pt had first chemo for gallbladder ca on Thursday. No n/v/d. Pt reports pain is similar to when he was diagnosed with GERD.patient states that his pain is crampy and occurs primarily after eats.  Also associated with a lot of gas.  His abdominal pain gets better if he can pass gas per rectum. Past Medical History  Diagnosis Date  . Arthritis   . Hyperlipidemia   . History of colon polyps   . Symptomatic PVCs   . COPD (chronic obstructive pulmonary disease)   . Left hydrocele   . Bilateral carotid artery stenosis     PER DUPLEX 11-17-2012---  RICA 7-59%/  LICA  16-38%  . Mild aortic valve stenosis   . Wears glasses   . Skin abrasion     right temple, left hand areas removed by dermatology last week, areas healing  . Family history of colon cancer   . Family history of breast cancer   . Hypertension   . Heart murmur   . HOH (hard of hearing)     wears hearing aids  . Allergy   . Skin cancer     basal cell carcinoma  . Cancer 06/24/14    gallbladder cancer  . BRCA1 positive    Past Surgical History  Procedure Laterality Date  . Stapedectomy Bilateral 1965 left, 1981 right  . Shoulder surgery Right 2004    rotator cuff  . Transthoracic echocardiogram  03-20-2013  DR WALL    GRADE I DIASTOLIC DYSFUNCTION/  EF 65-70%/  MILD AORTIC STENOSIS WITH STABLE GRADIENTS  . Tonsillectomy  1962  . Hydrocele excision Left 05/10/2013    Procedure: HYDROCELECTOMY ADULT;  Surgeon: Franchot Gallo, MD;  Location: [facility];  Service: Urology;  Laterality: Left;  . Colonoscopy    . Cholecystectomy N/A 06/24/2014    Procedure: LAPAROSCOPIC  CHOLECYSTECTOMY;  Surgeon: Coralie Keens, MD;  Location: [place];  Service: General;  Laterality: N/A;  . Open partial hepatectomy [83]  08/01/2014    Procedure: OPEN PARTIAL HEPATECTOMY, INTRA- OPERATIVE ULTRASOUND, COMMON BILE DUCT RESECTION, ROUX-EN-Y HEPATICOJEJUNOSTOMY;  Surgeon: [name], MD;  [facility];  Service: General;;  . Portacath placement N/A 08/26/2014    Procedure: INSERTION PORT-A-CATH;  Surgeon: [name], MD;  [facility];  Service: General;  Laterality: N/A;   Family History  Problem Relation Age of Onset  . Colon cancer Father 79  . Colon cancer Brother 51  . Colon cancer Brother   . Breast cancer Sister 40  . Cancer Maternal Uncle     NOS  . Breast cancer Paternal Aunt     dx <50  . Cancer Paternal Uncle     NOS  . Cancer Brother 75    cancer in a gland in his neck  . Brain cancer Brother 28  . Melanoma Brother 7  . Breast cancer Cousin     6 paternal first cousins    History  Substance Use Topics  . Smoking status: Former Smoker -- 1.00 packs/day for 10 years    Types: Cigarettes    Quit  date: 06/18/1983  . Smokeless tobacco: Never Used  . Alcohol Use: No    Review of Systems  All other systems reviewed and are negative  Allergies  Lodine; Flexeril; and Tetracyclines & related  Home Medications   Prior to Admission medications   Medication Sig Start Date End Date Taking? Authorizing Provider  aspirin EC 81 MG tablet Take 81 mg by mouth daily.   Yes Historical Provider, MD  lidocaine-prilocaine (EMLA) cream Apply small amount of cream over port area 1-2 hours prior to treatment and cover with plastic wrap.  DO NOT RUB IN 09/02/14  Yes Ladell Pier, MD  metoprolol tartrate (LOPRESSOR) 25 MG tablet Take 25 mg by mouth 2 (two) times daily.   Yes Historical Provider, MD  pantoprazole (PROTONIX) 20 MG tablet Take 20 mg by mouth daily. 08/20/14  Yes Historical Provider, MD  prochlorperazine (COMPAZINE) 10 MG  tablet Take 1 tablet (10 mg total) by mouth every 6 (six) hours as needed for nausea. 09/02/14  Yes Ladell Pier, MD  acetaminophen (TYLENOL) 500 MG tablet Take 2 tablets (1,000 mg total) by mouth 3 (three) times daily. Patient not taking: Reported on 08/22/2014 08/06/14   [name], MD  pravastatin (PRAVACHOL) 20 MG tablet Take 1 tablet (20 mg total) by mouth daily. Patient not taking: Reported on 07/23/2014 01/07/14   Mikey Kirschner, MD   BP 139/64 mmHg  Pulse 91  Temp(Src) 98.8 F (37.1 C) (Oral)  Resp 15  SpO2 100% Physical Exam Physical Exam  Nursing note and vitals reviewed. Constitutional: He is oriented to person, place, and time. He appears well-developed and well-nourished. No distress.  HENT:  Head: Normocephalic and atraumatic.  Eyes: Pupils are equal, round, and reactive to light.  Neck: Normal range of motion.  Cardiovascular: Normal rate and intact distal pulses.   Pulmonary/Chest: No respiratory distress.  Abdominal: Normal appearance. He exhibits no distension.no distention no rebound guarding tenderness.  No peritoneal signs.  Musculoskeletal: Normal range of motion.  Neurological: He is alert and oriented to person, place, and time. No cranial nerve deficit.  Skin: Skin is warm and dry. No rash noted.  Psychiatric: He has a normal mood and affect. His behavior is normal.   ED Course  Procedures (including critical care time) Labs Review Labs Reviewed  COMPREHENSIVE METABOLIC PANEL - Abnormal; Notable for the following:    Glucose, Bld 106 (*)    AST 187 (*)    ALT 137 (*)    Alkaline Phosphatase 238 (*)    GFR calc non Af Amer 83 (*)    All other components within normal limits  CBC WITH DIFFERENTIAL/PLATELET - Abnormal; Notable for the following:    Hemoglobin 12.9 (*)    Neutrophils Relative % 86 (*)    Neutro Abs 8.1 (*)    Lymphocytes Relative 10 (*)    Monocytes Relative 2 (*)    All other components within normal limits  LIPASE, BLOOD     Imaging Review Dg Abd Acute W/chest  09/07/2014   CLINICAL DATA:  Abdominal pain today. Recently began chemotherapy for gallbladder carcinoma.  EXAM: ACUTE ABDOMEN SERIES (ABDOMEN 2 VIEW & CHEST 1 VIEW)  COMPARISON:  08/26/2014, CT 08/19/2014  FINDINGS: The biliary stent is unchanged from the CT of 08/19/2014. There is pneumobilia, also unchanged. Air is scattered throughout small and large bowel. There is no evidence of obstruction. There is no evidence of perforation. The upright view of the chest demonstrates a right subclavian Port-A-Cath.  No acute cardiopulmonary findings are evident.  IMPRESSION: No radiographic evidence of obstruction or perforation. Left biliary stent appears unchanged in position. No acute cardiopulmonary findings.   Electronically Signed   By: Andreas Newport M.D.   On: 09/07/2014 19:02    Patient experienced no more pain while in the emergency room.  He states his abdominal pain is intermittent and primarily occurs after he eats.  After reviewing this film he has significant amount of stool in the right ascending colon.  His symptoms seem more consistent with crampy constipation type pain.  Plan at this time is 2 Dulcolax suppositories in the ER.  He can start on Mira lax 2 glasses a day tomorrow and then cut back to one a day after he starts having daily bowel movements.  MDM   Final diagnoses:  Abdominal pain        Leonard Schwartz, MD 09/07/14 2053

## 2014-09-08 ENCOUNTER — Other Ambulatory Visit: Payer: Self-pay | Admitting: Oncology

## 2014-09-09 ENCOUNTER — Other Ambulatory Visit: Payer: Self-pay | Admitting: General Surgery

## 2014-09-09 ENCOUNTER — Other Ambulatory Visit: Payer: Self-pay | Admitting: *Deleted

## 2014-09-09 DIAGNOSIS — T85520A Displacement of bile duct prosthesis, initial encounter: Secondary | ICD-10-CM

## 2014-09-09 DIAGNOSIS — C23 Malignant neoplasm of gallbladder: Secondary | ICD-10-CM

## 2014-09-10 ENCOUNTER — Ambulatory Visit
Admission: RE | Admit: 2014-09-10 | Discharge: 2014-09-10 | Disposition: A | Discharge status: Home / Self Care | Payer: Medicare Other | Source: Ambulatory Visit | Attending: General Surgery | Admitting: General Surgery

## 2014-09-10 MED ORDER — IOPAMIDOL (ISOVUE-300) INJECTION 61%
100.0000 mL | Freq: Once | INTRAVENOUS | Status: AC | PRN
  Administered 2014-09-10: 100 mL via INTRAVENOUS

## 2014-09-11 ENCOUNTER — Other Ambulatory Visit: Payer: Self-pay | Admitting: *Deleted

## 2014-09-11 DIAGNOSIS — C23 Malignant neoplasm of gallbladder: Secondary | ICD-10-CM

## 2014-09-12 ENCOUNTER — Telehealth: Payer: Self-pay | Admitting: Oncology

## 2014-09-12 ENCOUNTER — Ambulatory Visit (HOSPITAL_BASED_OUTPATIENT_CLINIC_OR_DEPARTMENT_OTHER): Payer: Medicare Other | Admitting: Nurse Practitioner

## 2014-09-12 ENCOUNTER — Other Ambulatory Visit (HOSPITAL_BASED_OUTPATIENT_CLINIC_OR_DEPARTMENT_OTHER): Payer: Medicare Other

## 2014-09-12 ENCOUNTER — Ambulatory Visit (HOSPITAL_BASED_OUTPATIENT_CLINIC_OR_DEPARTMENT_OTHER): Payer: Medicare Other

## 2014-09-12 VITALS — BP 114/54 | HR 81 | Temp 98.4°F | Resp 18 | Ht 73.0 in | Wt 172.3 lb

## 2014-09-12 DIAGNOSIS — C787 Secondary malignant neoplasm of liver and intrahepatic bile duct: Secondary | ICD-10-CM | POA: Diagnosis not present

## 2014-09-12 DIAGNOSIS — Z5111 Encounter for antineoplastic chemotherapy: Secondary | ICD-10-CM

## 2014-09-12 DIAGNOSIS — C23 Malignant neoplasm of gallbladder: Secondary | ICD-10-CM

## 2014-09-12 LAB — COMPREHENSIVE METABOLIC PANEL (CC13)
ALBUMIN: 3.4 g/dL — AB (ref 3.5–5.0)
ALT: 111 U/L — AB (ref 0–55)
AST: 55 U/L — AB (ref 5–34)
Alkaline Phosphatase: 333 U/L — ABNORMAL HIGH (ref 40–150)
Anion Gap: 9 mEq/L (ref 3–11)
BUN: 14 mg/dL (ref 7.0–26.0)
CHLORIDE: 102 meq/L (ref 98–109)
CO2: 27 mEq/L (ref 22–29)
Calcium: 8.7 mg/dL (ref 8.4–10.4)
Creatinine: 0.8 mg/dL (ref 0.7–1.3)
EGFR: 85 mL/min/{1.73_m2} — ABNORMAL LOW (ref >90)
Glucose: 89 mg/dl (ref 70–140)
POTASSIUM: 3.7 meq/L (ref 3.5–5.1)
Sodium: 138 mEq/L (ref 136–145)
Total Bilirubin: 0.39 mg/dL (ref 0.20–1.20)
Total Protein: 6.9 g/dL (ref 6.4–8.3)

## 2014-09-12 LAB — CBC WITH DIFFERENTIAL/PLATELET
BASO%: 0.7 % (ref 0.0–2.0)
Basophils Absolute: 0 10*3/uL (ref 0.0–0.1)
EOS%: 5.3 % (ref 0.0–7.0)
Eosinophils Absolute: 0.4 10*3/uL (ref 0.0–0.5)
HCT: 36.5 % — ABNORMAL LOW (ref 38.4–49.9)
HGB: 11.9 g/dL — ABNORMAL LOW (ref 13.0–17.1)
LYMPH#: 0.7 10*3/uL — AB (ref 0.9–3.3)
LYMPH%: 10.8 % — ABNORMAL LOW (ref 14.0–49.0)
MCH: 29.1 pg (ref 27.2–33.4)
MCHC: 32.7 g/dL (ref 32.0–36.0)
MCV: 89 fL (ref 79.3–98.0)
MONO#: 0.6 10*3/uL (ref 0.1–0.9)
MONO%: 9.4 % (ref 0.0–14.0)
NEUT#: 5.1 10*3/uL (ref 1.5–6.5)
NEUT%: 73.8 % (ref 39.0–75.0)
Platelets: 202 10*3/uL (ref 140–400)
RBC: 4.1 10*6/uL — AB (ref 4.20–5.82)
RDW: 13.1 % (ref 11.0–14.6)
WBC: 6.8 10*3/uL (ref 4.0–10.3)

## 2014-09-12 MED ORDER — PROCHLORPERAZINE MALEATE 10 MG PO TABS
ORAL_TABLET | ORAL | Status: AC
  Filled 2014-09-12: qty 1

## 2014-09-12 MED ORDER — SODIUM CHLORIDE 0.9 % IV SOLN
Freq: Once | INTRAVENOUS | Status: AC
  Administered 2014-09-12: 15:00:00 via INTRAVENOUS

## 2014-09-12 MED ORDER — PROCHLORPERAZINE MALEATE 10 MG PO TABS
10.0000 mg | ORAL_TABLET | Freq: Once | ORAL | Status: AC
  Administered 2014-09-12: 10 mg via ORAL

## 2014-09-12 MED ORDER — SODIUM CHLORIDE 0.9 % IV SOLN
800.0000 mg/m2 | Freq: Once | INTRAVENOUS | Status: AC
  Administered 2014-09-12: 1634 mg via INTRAVENOUS
  Filled 2014-09-12: qty 42.98

## 2014-09-12 MED ORDER — HEPARIN SOD (PORK) LOCK FLUSH 100 UNIT/ML IV SOLN
500.0000 [IU] | Freq: Once | INTRAVENOUS | Status: AC | PRN
  Administered 2014-09-12: 500 [IU]
  Filled 2014-09-12: qty 5

## 2014-09-12 MED ORDER — SODIUM CHLORIDE 0.9 % IJ SOLN
10.0000 mL | INTRAMUSCULAR | Status: DC | PRN
  Administered 2014-09-12: 10 mL
  Filled 2014-09-12: qty 10

## 2014-09-12 NOTE — Progress Notes (Signed)
Ok to treat with todays lab values per Ned Card, NP.

## 2014-09-12 NOTE — Progress Notes (Signed)
  [facility] OFFICE PROGRESS NOTE   Diagnosis:  Gallbladder carcinoma  INTERVAL HISTORY:   Ronnie Nicholson returns as scheduled. He completed the first treatment with gemcitabine on 09/05/2014. He denies nausea/vomiting. No mouth sores. No significant diarrhea. No skin rash. No fever. He denies shortness of breath and cough. He continues to have intermittent upper abdominal pain. The pain typically last a few minutes and generally occurs after eating. He was evaluated in the emergency department on 09/07/2014 due to an episode of the pain. The emergency room physician felt his symptoms were consistent with crampy constipation-type pain and recommended a bowel regimen. He had a CT scan on 09/10/2014 that showed resolving postsurgical changes; persistent pneumobilia status post biliary stenting; no evidence of metastatic disease. He was prescribed ursodiol by Dr. Barry Dienes. He noted less episodes of pain yesterday and none so far today.  Objective:  Vital signs in last 24 hours:  Blood pressure 114/54, pulse 81, temperature 98.4 F (36.9 C), temperature source Oral, resp. rate 18, height $RemoveBe'6\' 1"'IoFlczjRH$  (1.854 m), weight 172 lb 4.8 oz (78.155 kg), SpO2 99 %.    HEENT: No thrush or ulcers. Resp: Lungs clear bilaterally. Cardio: Regular rate and rhythm. GI: Abdomen is soft and nontender. No mass. No organomegaly. Healed surgical incisions. Vascular: No leg edema. Calves soft and nontender.  Skin: No rash.    Lab Results:  Lab Results  Component Value Date   WBC 6.8 09/12/2014   HGB 11.9* 09/12/2014   HCT 36.5* 09/12/2014   MCV 89.0 09/12/2014   PLT 202 09/12/2014   NEUTROABS 5.1 09/12/2014    Imaging:  No results found.  Medications: I have reviewed the patient's current medications.  Assessment/Plan: 1. 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 Intermittent right upper abdominal pain beginning in the summer of 2015-likely related to symptomatic cholelithiasis  3. History of colon polyps  4. Family history of multiple cancers including colon cancer, status post a genetics evaluation. Positive for the BRCA1 mutation.  5.   Elevated liver enzymes 09/07/2014; improved 09/12/2014. Question related to gemcitabine.   Disposition: [name] appears stable. He has completed one treatment with gemcitabine. The liver enzymes were increased on 09/07/2014 when he was evaluated in the emergency department and are showing a trend for improvement today. I reviewed the labs with the cancer center pharmacist as well as Dr. Alen Blew. Plan to proceed with treatment today as scheduled. We will see him in follow-up in one week and we will repeat a chemistry panel at that time. He will contact the office in the interim with any problems.  25 minutes were spent face-to-face at today's visit with the majority of that time involved in counseling/coordination of care.  Ned Card ANP/GNP-BC   09/12/2014  1:52 PM

## 2014-09-12 NOTE — Patient Instructions (Addendum)
[place] Discharge Instructions for Patients Receiving Chemotherapy  Today you received the following chemotherapy agents Gemzar To help prevent nausea and vomiting after your treatment, we encourage you to take your nausea medication as prescribed.  If you develop nausea and vomiting that is not controlled by your nausea medication, call the clinic.   BELOW ARE SYMPTOMS THAT SHOULD BE REPORTED IMMEDIATELY:  *FEVER GREATER THAN 100.5 F  *CHILLS WITH OR WITHOUT FEVER  NAUSEA AND VOMITING THAT IS NOT CONTROLLED WITH YOUR NAUSEA MEDICATION  *UNUSUAL SHORTNESS OF BREATH  *UNUSUAL BRUISING OR BLEEDING  TENDERNESS IN MOUTH AND THROAT WITH OR WITHOUT PRESENCE OF ULCERS  *URINARY PROBLEMS  *BOWEL PROBLEMS  UNUSUAL RASH Items with * indicate a potential emergency and should be followed up as soon as possible.  Feel free to call the clinic you have any questions or concerns. The clinic phone number is [phone].  Please show the [place] at check-in to the Emergency Department and triage nurse.  Gemcitabine injection What is this medicine? GEMCITABINE (jem SIT a been) is a chemotherapy drug. This medicine is used to treat many types of cancer like breast cancer, lung cancer, pancreatic cancer, and ovarian cancer. This medicine may be used for other purposes; ask your health care provider or pharmacist if you have questions. COMMON BRAND NAME(S): Gemzar What should I tell my health care provider before I take this medicine? They need to know if you have any of these conditions: -blood disorders -infection -kidney disease -liver disease -recent or ongoing radiation therapy -an unusual or allergic reaction to gemcitabine, other chemotherapy, other medicines, foods, dyes, or preservatives -pregnant or trying to get pregnant -breast-feeding How should I use this medicine? This drug is given as an infusion into a vein. It is administered in a hospital or  clinic by a specially trained health care professional. Talk to your pediatrician regarding the use of this medicine in children. Special care may be needed. Overdosage: If you think you have taken too much of this medicine contact a poison control center or emergency room at once. NOTE: This medicine is only for you. Do not share this medicine with others. What if I miss a dose? It is important not to miss your dose. Call your doctor or health care professional if you are unable to keep an appointment. What may interact with this medicine? -medicines to increase blood counts like filgrastim, pegfilgrastim, sargramostim -some other chemotherapy drugs like cisplatin -vaccines Talk to your doctor or health care professional before taking any of these medicines: -acetaminophen -aspirin -ibuprofen -ketoprofen -naproxen This list may not describe all possible interactions. Give your health care provider a list of all the medicines, herbs, non-prescription drugs, or dietary supplements you use. Also tell them if you smoke, drink alcohol, or use illegal drugs. Some items may interact with your medicine. What should I watch for while using this medicine? Visit your doctor for checks on your progress. This drug may make you feel generally unwell. This is not uncommon, as chemotherapy can affect healthy cells as well as cancer cells. Report any side effects. Continue your course of treatment even though you feel ill unless your doctor tells you to stop. In some cases, you may be given additional medicines to help with side effects. Follow all directions for their use. Call your doctor or health care professional for advice if you get a fever, chills or sore throat, or other symptoms of a cold or flu. Do not  treat yourself. This drug decreases your body's ability to fight infections. Try to avoid being around people who are sick. This medicine may increase your risk to bruise or bleed. Call your doctor or  health care professional if you notice any unusual bleeding. Be careful brushing and flossing your teeth or using a toothpick because you may get an infection or bleed more easily. If you have any dental work done, tell your dentist you are receiving this medicine. Avoid taking products that contain aspirin, acetaminophen, ibuprofen, naproxen, or ketoprofen unless instructed by your doctor. These medicines may hide a fever. Women should inform their doctor if they wish to become pregnant or think they might be pregnant. There is a potential for serious side effects to an unborn child. Talk to your health care professional or pharmacist for more information. Do not breast-feed an infant while taking this medicine. What side effects may I notice from receiving this medicine? Side effects that you should report to your doctor or health care professional as soon as possible: -allergic reactions like skin rash, itching or hives, swelling of the face, lips, or tongue -low blood counts - this medicine may decrease the number of white blood cells, red blood cells and platelets. You may be at increased risk for infections and bleeding. -signs of infection - fever or chills, cough, sore throat, pain or difficulty passing urine -signs of decreased platelets or bleeding - bruising, pinpoint red spots on the skin, black, tarry stools, blood in the urine -signs of decreased red blood cells - unusually weak or tired, fainting spells, lightheadedness -breathing problems -chest pain -mouth sores -nausea and vomiting -pain, swelling, redness at site where injected -pain, tingling, numbness in the hands or feet -stomach pain -swelling of ankles, feet, hands -unusual bleeding Side effects that usually do not require medical attention (report to your doctor or health care professional if they continue or are bothersome): -constipation -diarrhea -hair loss -loss of appetite -stomach upset This list may not  describe all possible side effects. Call your doctor for medical advice about side effects. You may report side effects to FDA at 1-800-FDA-1088. Where should I keep my medicine? This drug is given in a hospital or clinic and will not be stored at home. NOTE: This sheet is a summary. It may not cover all possible information. If you have questions about this medicine, talk to your doctor, pharmacist, or health care provider.  2015, Elsevier/Gold Standard. (2007-10-03 18:45:54)

## 2014-09-12 NOTE — Telephone Encounter (Signed)
Gave patient wife avs report and appointments for April. Per LT appointment with [facility] 4/14 @ 3:30pm (shared visit) tx immediately after visit.

## 2014-09-15 ENCOUNTER — Other Ambulatory Visit: Payer: Self-pay | Admitting: Oncology

## 2014-09-16 ENCOUNTER — Ambulatory Visit: Payer: Medicare Other

## 2014-09-16 ENCOUNTER — Ambulatory Visit: Payer: Medicare Other | Admitting: Radiation Oncology

## 2014-09-19 ENCOUNTER — Ambulatory Visit (HOSPITAL_BASED_OUTPATIENT_CLINIC_OR_DEPARTMENT_OTHER): Payer: Medicare Other

## 2014-09-19 ENCOUNTER — Other Ambulatory Visit (HOSPITAL_BASED_OUTPATIENT_CLINIC_OR_DEPARTMENT_OTHER): Payer: Medicare Other

## 2014-09-19 ENCOUNTER — Ambulatory Visit (HOSPITAL_BASED_OUTPATIENT_CLINIC_OR_DEPARTMENT_OTHER): Payer: Medicare Other | Admitting: Oncology

## 2014-09-19 ENCOUNTER — Telehealth: Payer: Self-pay | Admitting: Oncology

## 2014-09-19 VITALS — BP 130/59 | HR 88 | Temp 98.1°F | Resp 18 | Ht 73.0 in | Wt 175.1 lb

## 2014-09-19 DIAGNOSIS — C23 Malignant neoplasm of gallbladder: Secondary | ICD-10-CM

## 2014-09-19 DIAGNOSIS — C787 Secondary malignant neoplasm of liver and intrahepatic bile duct: Secondary | ICD-10-CM

## 2014-09-19 DIAGNOSIS — Z5111 Encounter for antineoplastic chemotherapy: Secondary | ICD-10-CM

## 2014-09-19 DIAGNOSIS — R748 Abnormal levels of other serum enzymes: Secondary | ICD-10-CM

## 2014-09-19 LAB — CBC WITH DIFFERENTIAL/PLATELET
BASO%: 0.9 % (ref 0.0–2.0)
Basophils Absolute: 0 10*3/uL (ref 0.0–0.1)
EOS%: 2 % (ref 0.0–7.0)
Eosinophils Absolute: 0.1 10*3/uL (ref 0.0–0.5)
HCT: 33.2 % — ABNORMAL LOW (ref 38.4–49.9)
HGB: 10.7 g/dL — ABNORMAL LOW (ref 13.0–17.1)
LYMPH#: 1.2 10*3/uL (ref 0.9–3.3)
LYMPH%: 39.5 % (ref 14.0–49.0)
MCH: 28.8 pg (ref 27.2–33.4)
MCHC: 32.2 g/dL (ref 32.0–36.0)
MCV: 89.5 fL (ref 79.3–98.0)
MONO#: 0.3 10*3/uL (ref 0.1–0.9)
MONO%: 9.1 % (ref 0.0–14.0)
NEUT%: 48.5 % (ref 39.0–75.0)
NEUTROS ABS: 1.4 10*3/uL — AB (ref 1.5–6.5)
PLATELETS: 179 10*3/uL (ref 140–400)
RBC: 3.71 10*6/uL — ABNORMAL LOW (ref 4.20–5.82)
RDW: 13.1 % (ref 11.0–14.6)
WBC: 3 10*3/uL — AB (ref 4.0–10.3)

## 2014-09-19 LAB — COMPREHENSIVE METABOLIC PANEL (CC13)
ALK PHOS: 260 U/L — AB (ref 40–150)
ALT: 58 U/L — AB (ref 0–55)
AST: 37 U/L — AB (ref 5–34)
Albumin: 3.4 g/dL — ABNORMAL LOW (ref 3.5–5.0)
Anion Gap: 9 mEq/L (ref 3–11)
BUN: 18.5 mg/dL (ref 7.0–26.0)
CALCIUM: 8.7 mg/dL (ref 8.4–10.4)
CHLORIDE: 104 meq/L (ref 98–109)
CO2: 29 mEq/L (ref 22–29)
Creatinine: 0.9 mg/dL (ref 0.7–1.3)
EGFR: 84 mL/min/{1.73_m2} — ABNORMAL LOW (ref >90)
Glucose: 85 mg/dl (ref 70–140)
POTASSIUM: 3.7 meq/L (ref 3.5–5.1)
Sodium: 142 mEq/L (ref 136–145)
Total Bilirubin: 0.21 mg/dL (ref 0.20–1.20)
Total Protein: 6.7 g/dL (ref 6.4–8.3)

## 2014-09-19 MED ORDER — SODIUM CHLORIDE 0.9 % IV SOLN
Freq: Once | INTRAVENOUS | Status: AC
  Administered 2014-09-19: 17:00:00 via INTRAVENOUS

## 2014-09-19 MED ORDER — SODIUM CHLORIDE 0.9 % IJ SOLN
10.0000 mL | INTRAMUSCULAR | Status: DC | PRN
  Administered 2014-09-19: 10 mL
  Filled 2014-09-19: qty 10

## 2014-09-19 MED ORDER — PROCHLORPERAZINE MALEATE 10 MG PO TABS
ORAL_TABLET | ORAL | Status: AC
  Filled 2014-09-19: qty 1

## 2014-09-19 MED ORDER — HEPARIN SOD (PORK) LOCK FLUSH 100 UNIT/ML IV SOLN
500.0000 [IU] | Freq: Once | INTRAVENOUS | Status: AC | PRN
  Administered 2014-09-19: 500 [IU]
  Filled 2014-09-19: qty 5

## 2014-09-19 MED ORDER — PROCHLORPERAZINE MALEATE 10 MG PO TABS
10.0000 mg | ORAL_TABLET | Freq: Once | ORAL | Status: AC
  Administered 2014-09-19: 10 mg via ORAL

## 2014-09-19 MED ORDER — GEMCITABINE HCL CHEMO INJECTION 1 GM/26.3ML
800.0000 mg/m2 | Freq: Once | INTRAVENOUS | Status: AC
  Administered 2014-09-19: 1634 mg via INTRAVENOUS
  Filled 2014-09-19: qty 42.98

## 2014-09-19 NOTE — Telephone Encounter (Signed)
gave and printed appt sched and av sfo rpt for April and May.Marland KitchenMarland KitchenMarland KitchenAmy will call me and let me know if pt still needs 4.21 appt

## 2014-09-19 NOTE — Patient Instructions (Signed)
[facility] Discharge Instructions for Patients Receiving Chemotherapy  Today you received the following chemotherapy agents: Gemzar  To help prevent nausea and vomiting after your treatment, we encourage you to take your nausea medication as prescribed by your physician.    If you develop nausea and vomiting that is not controlled by your nausea medication, call the clinic.   BELOW ARE SYMPTOMS THAT SHOULD BE REPORTED IMMEDIATELY:  *FEVER GREATER THAN 100.5 F  *CHILLS WITH OR WITHOUT FEVER  NAUSEA AND VOMITING THAT IS NOT CONTROLLED WITH YOUR NAUSEA MEDICATION  *UNUSUAL SHORTNESS OF BREATH  *UNUSUAL BRUISING OR BLEEDING  TENDERNESS IN MOUTH AND THROAT WITH OR WITHOUT PRESENCE OF ULCERS  *URINARY PROBLEMS  *BOWEL PROBLEMS  UNUSUAL RASH Items with * indicate a potential emergency and should be followed up as soon as possible.  Feel free to call the clinic you have any questions or concerns. The clinic phone number is [phone].  Please show the CHEMO ALERT CARD at check-in to the Emergency Department and triage nurse.   

## 2014-09-19 NOTE — Progress Notes (Signed)
  [place] OFFICE PROGRESS NOTE   Diagnosis: Gallbladder carcinoma  INTERVAL HISTORY:   Mr. Ronnie Nicholson returns as scheduled. He continues to have intermittent episodes of upper abdominal pain lasting for approximately 10 seconds. He completed another treatment with gemcitabine 09/12/2014. No rash or fever following chemotherapy.  Objective:  Vital signs in last 24 hours:  Blood pressure 130/59, pulse 88, temperature 98.1 F (36.7 C), temperature source Oral, resp. rate 18, height $RemoveBe'6\' 1"'yLokQVgwX$  (1.854 m), weight 175 lb 1.6 oz (79.425 kg), SpO2 100 %.    HEENT: No thrush or ulcers Resp: Distant breath sounds, no respiratory distress Cardio: Regular rate and rhythm GI: No hepatomegaly, nontender, no mass Vascular: No leg edema   Portacath/PICC-without erythema  Lab Results:  Lab Results  Component Value Date   WBC 3.0* 09/19/2014   HGB 10.7* 09/19/2014   HCT 33.2* 09/19/2014   MCV 89.5 09/19/2014   PLT 179 09/19/2014   NEUTROABS 1.4* 09/19/2014    Medications: I have reviewed the patient's current medications.  Assessment/Plan: 1. 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 Intermittent right upper abdominal pain beginning in the summer of 2015-likely related to symptomatic cholelithiasis  3. History of colon polyps  4. Family history of multiple  cancers including colon cancer, status post a genetics evaluation. Positive for the BRCA1 mutation.  5. Elevated liver enzymes 09/07/2014; improved    Disposition:  Mr. [name] will complete a third treatment with gemcitabine today. He will then be treated with concurrent Xeloda and radiation. I discussed the case with Dr. Lisbeth Renshaw. Radiation will start on 10/07/2014. Mr. large will return for an office visit 10/15/2014.  He has borderline neutropenia today. He knows to contact us for a fever.  We reviewed the potential toxicities associated with capecitabine including the chance for mucositis, diarrhea, rash, sun sensitivity, hyperpigmentation, and the hand/foot syndrome. He will discontinue capecitabine and contact us if he develops diarrhea.  Betsy Coder, MD  09/19/2014  3:38 PM

## 2014-09-19 NOTE — Progress Notes (Signed)
Per Dr. Benay Spice, okay to tx with ANC 1.4

## 2014-09-19 NOTE — Telephone Encounter (Signed)
lvm for pt regarding to 4.21 lab and chemo cx

## 2014-09-24 ENCOUNTER — Other Ambulatory Visit: Payer: Self-pay | Admitting: *Deleted

## 2014-09-24 MED ORDER — CAPECITABINE 500 MG PO TABS: ORAL_TABLET | ORAL | Status: DC

## 2014-09-25 ENCOUNTER — Other Ambulatory Visit (HOSPITAL_BASED_OUTPATIENT_CLINIC_OR_DEPARTMENT_OTHER): Payer: Medicare Other

## 2014-09-25 ENCOUNTER — Ambulatory Visit
Admission: RE | Admit: 2014-09-25 | Discharge: 2014-09-25 | Disposition: A | Discharge status: Home / Self Care | Payer: Medicare Other | Source: Ambulatory Visit | Attending: Radiation Oncology | Admitting: Radiation Oncology

## 2014-09-25 ENCOUNTER — Encounter: Payer: Self-pay | Admitting: Radiation Oncology

## 2014-09-25 VITALS — BP 121/54 | HR 75 | Temp 97.6°F | Resp 16 | Ht 73.0 in | Wt 175.9 lb

## 2014-09-25 DIAGNOSIS — C23 Malignant neoplasm of gallbladder: Secondary | ICD-10-CM

## 2014-09-25 DIAGNOSIS — Z510 Encounter for antineoplastic radiation therapy: Secondary | ICD-10-CM | POA: Diagnosis not present

## 2014-09-25 LAB — CBC WITH DIFFERENTIAL/PLATELET
BASO%: 0.8 % (ref 0.0–2.0)
Basophils Absolute: 0 10*3/uL (ref 0.0–0.1)
EOS ABS: 0.1 10*3/uL (ref 0.0–0.5)
EOS%: 2.4 % (ref 0.0–7.0)
HEMATOCRIT: 32.6 % — AB (ref 38.4–49.9)
HGB: 10.6 g/dL — ABNORMAL LOW (ref 13.0–17.1)
LYMPH%: 35.4 % (ref 14.0–49.0)
MCH: 28.9 pg (ref 27.2–33.4)
MCHC: 32.4 g/dL (ref 32.0–36.0)
MCV: 89.3 fL (ref 79.3–98.0)
MONO#: 0.1 10*3/uL (ref 0.1–0.9)
MONO%: 3.3 % (ref 0.0–14.0)
NEUT%: 58.1 % (ref 39.0–75.0)
NEUTROS ABS: 2.4 10*3/uL (ref 1.5–6.5)
PLATELETS: 231 10*3/uL (ref 140–400)
RBC: 3.65 10*6/uL — ABNORMAL LOW (ref 4.20–5.82)
RDW: 13.5 % (ref 11.0–14.6)
WBC: 4.2 10*3/uL (ref 4.0–10.3)
lymph#: 1.5 10*3/uL (ref 0.9–3.3)

## 2014-09-25 LAB — COMPREHENSIVE METABOLIC PANEL (CC13)
ALT: 44 U/L (ref 0–55)
AST: 26 U/L (ref 5–34)
Albumin: 3.5 g/dL (ref 3.5–5.0)
Alkaline Phosphatase: 211 U/L — ABNORMAL HIGH (ref 40–150)
Anion Gap: 13 mEq/L — ABNORMAL HIGH (ref 3–11)
BUN: 15.3 mg/dL (ref 7.0–26.0)
CO2: 26 mEq/L (ref 22–29)
Calcium: 8.7 mg/dL (ref 8.4–10.4)
Chloride: 104 mEq/L (ref 98–109)
Creatinine: 0.8 mg/dL (ref 0.7–1.3)
EGFR: 85 mL/min/{1.73_m2} — ABNORMAL LOW (ref >90)
GLUCOSE: 90 mg/dL (ref 70–140)
Potassium: 4.2 mEq/L (ref 3.5–5.1)
SODIUM: 143 meq/L (ref 136–145)
Total Bilirubin: <0.2 mg/dL (ref 0.20–1.20)
Total Protein: 6.7 g/dL (ref 6.4–8.3)

## 2014-09-25 NOTE — Progress Notes (Signed)
Radiation Oncology         [phone] ________________________________  Name: Ronnie Nicholson MRN: [id number]  Date: 09/25/2014  DOB: [birth year]  Follow-Up Visit Note  CC: Rubbie Battiest, MD  Ladell Pier, MD  Diagnosis:   Gallbladder cancer   Staging form: Gallbladder, AJCC 7th Edition     Clinical: Stage IIIA (T3, N0, M0) - Signed by Ladell Pier, MD on 07/16/2014     Pathologic: Stage IIIB (T3, N1, cM0) - Signed by Ladell Pier, MD on 08/22/2014   Interval Since Last Radiation:  N/A  Narrative:  The patient returns today for routine follow-up.  Pt feels good after 3 chemo treatments.4th tx was cancelled. Pt experiences change in taste from chemo. Pt has no other complaints at this time.  ALLERGIES:  is allergic to lodine; flexeril; and tetracyclines & related.  Meds: Current Outpatient Prescriptions  Medication Sig Dispense Refill  . aspirin EC 81 MG tablet Take 81 mg by mouth daily.    Marland Kitchen lidocaine-prilocaine (EMLA) cream Apply small amount of cream over port area 1-2 hours prior to treatment and cover with plastic wrap.  DO NOT RUB IN 30 g PRN  . metoprolol tartrate (LOPRESSOR) 25 MG tablet Take 25 mg by mouth 2 (two) times daily.    . pantoprazole (PROTONIX) 20 MG tablet Take 20 mg by mouth daily.    . ursodiol (ACTIGALL) 300 MG capsule     . capecitabine (XELODA) 500 MG tablet Take 3 tab (1500mg ) in AM; Take 3 tab (1500mg ) in PM. Total daily dose =3000mg .  Take on Radiation days ONLY.  Start 10/07/14 (Patient not taking: Reported on 09/25/2014) 168 tablet 0  . pravastatin (PRAVACHOL) 20 MG tablet Take 1 tablet (20 mg total) by mouth daily. (Patient not taking: Reported on 07/23/2014) 90 tablet 1  . prochlorperazine (COMPAZINE) 10 MG tablet Take 1 tablet (10 mg total) by mouth every 6 (six) hours as needed for nausea. (Patient not taking: Reported on 09/25/2014) 60 tablet 1   No current facility-administered medications for this encounter.    Physical Findings: The  patient is in no acute distress. Patient is alert and oriented.  height is 6\' 1"  (1.854 m) and weight is 175 lb 14.4 oz (79.788 kg). His oral temperature is 97.6 F (36.4 C). His blood pressure is 121/54 and his pulse is 75. His respiration is 16 and oxygen saturation is 100%. .    Lab Findings: Lab Results  Component Value Date   WBC 4.2 09/25/2014   HGB 10.6* 09/25/2014   HCT 32.6* 09/25/2014   MCV 89.3 09/25/2014   PLT 231 09/25/2014     Radiographic Findings: Ct Abdomen Pelvis W Contrast  09/10/2014   CLINICAL DATA:  Gallbladder cancer. Cholecystectomy 06/24/2014 with subsequent surgery in February including partial hepatectomy. Mid abdominal and back pain. Weight loss.  EXAM: CT ABDOMEN AND PELVIS WITH CONTRAST  TECHNIQUE: Multidetector CT imaging of the abdomen and pelvis was performed using the standard protocol following bolus administration of intravenous contrast.  CONTRAST:  100 ml Isovue-300.  COMPARISON:  Abdominal pelvic CT 08/19/2014, 07/17/2014 and 05/24/2014.  FINDINGS: Lower chest: Irregular density anteriorly in the right lower lobe on image number 7 is unchanged from the baseline examination and likely represents scarring. Small right lower lobe nodule on image number 6 and lingular nodule on image number 7 are stable.  Hepatobiliary: Fluid collection within the cholecystectomy/partial hepatectomy bed has nearly completely resolved. There is no residual extraluminal air in  this area or definite residual mass. Plastic biliary stent remains in place, extending into the left hepatic lobe. There is stable pneumobilia. Mild periportal edema is noted within the liver. No focal hepatic lesion identified.  Pancreas: Prominence of the pancreatic head noted on the most recent examination has resolved. No definite focal abnormality or surrounding inflammation.  Spleen: Normal in size without focal abnormality.  Adrenals/Urinary Tract: Stable small right adrenal nodule, likely an adenoma. The  left adrenal gland appears normal.Stable bilateral renal sinus cysts. No evidence of urinary tract calculus or hydronephrosis. The bladder demonstrates no significant findings.  Stomach/Bowel: No evidence of bowel wall thickening, distention or surrounding inflammatory change. Postsurgical changes are noted within the small bowel in the left abdomen. No ascites or focal extraluminal fluid collection.  Vascular/Lymphatic: There are no enlarged abdominal or pelvic lymph nodes. Stable mild aortoiliac atherosclerosis.  Reproductive: Stable mild enlargement of the prostate gland with associated dystrophic calcifications. There are probable bilateral scrotal hydroceles.  Other: Resolving postsurgical changes within the anterior abdominal wall. 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  Musculoskeletal: No acute or significant osseous findings.  IMPRESSION: 1. Resolving postsurgical changes status post cholecystectomy and partial hepatectomy. No significant residual fluid collection demonstrated. 2. Persistent pneumobilia status post biliary stenting. 3. No evidence of metastatic disease. 4. Stable findings at the lung bases from baseline examination of 4 months ago. Attention on follow-up recommended. 5. Stable small right adrenal nodule.   Electronically Signed   By: Richardean Sale M.D.   On: 09/10/2014 11:29   Dg Abd Acute W/chest  09/07/2014   CLINICAL DATA:  Abdominal pain today. Recently began chemotherapy for gallbladder carcinoma.  EXAM: ACUTE ABDOMEN SERIES (ABDOMEN 2 VIEW & CHEST 1 VIEW)  COMPARISON:  08/26/2014, CT 08/19/2014  FINDINGS: The biliary stent is unchanged from the CT of 08/19/2014. There is pneumobilia, also unchanged. Air is scattered throughout small and large bowel. There is no evidence of obstruction. There is no evidence of perforation. The upright view of the chest demonstrates a right subclavian Port-A-Cath. No acute cardiopulmonary findings are evident.  IMPRESSION:  No radiographic evidence of obstruction or perforation. Left biliary stent appears unchanged in position. No acute cardiopulmonary findings.   Electronically Signed   By: Andreas Newport M.D.   On: 09/07/2014 19:02    Impression: No change in hemoglobin count. WBC looks good. Everything appears to be on schedule for radiation.  Plan:  Schedule CT Simulation on 09/27/14. Scheduled for radiation on 10/07/14.   This document serves as a record of services personally performed by Kyung Rudd, MD. It was created on his behalf by Darcus Austin, a trained medical scribe. The creation of this record is based on the scribe's personal observations and the provider's statements to them. This document has been checked and approved by the attending provider.      Jodelle Gross, M.D., Ph.D.   The patient was seen today for 15 minutes, with the majority of the time spent counseling the patient on his diagnosis of cancer and coordinating his care.

## 2014-09-25 NOTE — Progress Notes (Signed)
GI Location of Tumor / Histology: Gallbladder Cancer stage IIIB  Furqan Alleen Borne presented months ago with symptoms of: Intermittent right upper abdominal pain   Biopsies of (if applicable) revealed: Diagnosis 1/18.2016: Gallbladder - INVASIVE ADENOCARCINOMA, POORLY DIFFERENTIATED, SPANNING 2.5 CM.- ADENOCARCINOMA SHOWS DIRECT EXTENSION INTO ATTACHED HEPATIC PARENCHYMA AND IS PRESENT AT INKED PARENCHYMAL MARGIN.- ADENOCARCINOMA IS PRESENT AT THE CYSTIC DUCT MARGIN.- LYMPHOVASCULAR INVASION IS IDENTIFIED, DIFFUSE.- ONE BENIGN PERICYSTIC LYMPH NODE (0/1).Cholecystectomy, [name], [name],MD  Past/Anticipated interventions by surgeon, if any: Diagnosis 08/01/14: 1. Liver, partial resection, partial section 5 - FOCAL RESIDUAL INVASIVE ADENOCARCINOMA IN BACKGROUND OF EXTENSIVE FIBROSIS,HEMOSIDERIN LADEN MACROPHAGES AND FOREIGN BODY GIANT CELL REACTIONS.- INKED RESECTION MARGIN, NEGATIVE FOR ATYPIA OR MALIGNANCY. 2. Common Bile Duct , resection - NO EVIDENCE OF MALIGNANCY. EXTENSIVE STROMAL FIBROSIS AND FOREIGN BODY GIANT CELL REACTION.- ONE LYMPH NODE, POSITIVE FOR RESIDUAL METASTATIC ADENOCARCINOMA WITH EXTRANODAL EXTENSION (1/1). 3. Lymph node, biopsy - FOUR LYMPH NODES, NEGATIVE FOR METASTATIC CARCINOMA (0/4).Dr. Harvie Bridge  Past/Anticipated interventions by medical oncology, if any: 1st gemcitabine infusion 09/05/14 @ 1030 am,F/u appt [name] 09/12/14, lab and gemcitabine infusion.  3rd cycle completed on 09/19/14. To start xeloda with radiation.  Weight changes, if any: has lost 22 lbs since January.  States his appetite is improving.  Bowel/Bladder complaints, if any: denies constipation/diarrhea.  Reports urinary frequency from chemotherapy.  Nausea / Vomiting, if any: None  Pain issues, if any: None  SAFETY ISSUES: No  Prior radiation?No  Pacemaker/ICD?NO  Is the patient on methotrexate? NO  Current Complaints / other details: Married, 1 son, B/L  hearing loss, wears hearing aids.  Patient is here with his wife.  He had labs drawn today and is asking for the results.  BP 121/54 mmHg  Pulse 75  Temp(Src) 97.6 F (36.4 C) (Oral)  Resp 16  Ht 6\' 1"  (1.854 m)  Wt 175 lb 14.4 oz (79.788 kg)  BMI 23.21 kg/m2  SpO2 100%

## 2014-09-25 NOTE — Progress Notes (Signed)
Please see the Nurse Progress Note in the MD Initial Consult Encounter for this patient. 

## 2014-09-26 ENCOUNTER — Other Ambulatory Visit: Payer: Medicare Other

## 2014-09-26 ENCOUNTER — Ambulatory Visit: Payer: Medicare Other

## 2014-09-27 ENCOUNTER — Ambulatory Visit
Admission: RE | Admit: 2014-09-27 | Discharge: 2014-09-27 | Disposition: A | Discharge status: Home / Self Care | Payer: Medicare Other | Source: Ambulatory Visit | Attending: Radiation Oncology | Admitting: Radiation Oncology

## 2014-09-27 DIAGNOSIS — Z510 Encounter for antineoplastic radiation therapy: Secondary | ICD-10-CM | POA: Diagnosis not present

## 2014-09-27 DIAGNOSIS — C23 Malignant neoplasm of gallbladder: Secondary | ICD-10-CM

## 2014-09-29 ENCOUNTER — Other Ambulatory Visit: Payer: Self-pay | Admitting: Oncology

## 2014-09-30 DIAGNOSIS — Z510 Encounter for antineoplastic radiation therapy: Secondary | ICD-10-CM | POA: Diagnosis not present

## 2014-10-04 ENCOUNTER — Ambulatory Visit
Admission: RE | Admit: 2014-10-04 | Discharge: 2014-10-04 | Disposition: A | Discharge status: Home / Self Care | Payer: Medicare Other | Source: Ambulatory Visit | Attending: Radiation Oncology | Admitting: Radiation Oncology

## 2014-10-04 DIAGNOSIS — Z510 Encounter for antineoplastic radiation therapy: Secondary | ICD-10-CM | POA: Diagnosis not present

## 2014-10-06 ENCOUNTER — Other Ambulatory Visit: Payer: Self-pay | Admitting: Oncology

## 2014-10-07 ENCOUNTER — Ambulatory Visit
Admission: RE | Admit: 2014-10-07 | Discharge: 2014-10-07 | Disposition: A | Discharge status: Home / Self Care | Payer: Medicare Other | Source: Ambulatory Visit | Attending: Radiation Oncology | Admitting: Radiation Oncology

## 2014-10-07 VITALS — Wt 177.8 lb

## 2014-10-07 DIAGNOSIS — Z510 Encounter for antineoplastic radiation therapy: Secondary | ICD-10-CM | POA: Diagnosis not present

## 2014-10-07 DIAGNOSIS — C23 Malignant neoplasm of gallbladder: Secondary | ICD-10-CM

## 2014-10-07 MED ORDER — RADIAPLEXRX EX GEL: Freq: Once | CUTANEOUS | Status: DC

## 2014-10-07 NOTE — Progress Notes (Signed)
Pt education done, radiaplex gel given, rad book also given, discussed ways to manage side effects of nausea,  Diarrhea, bladder changes fatigue, skin irritation, my business card also given to patient, MD Aldean Ast RN to see patient weekly after rad txs  And prn, teach back given

## 2014-10-08 ENCOUNTER — Ambulatory Visit
Admission: RE | Admit: 2014-10-08 | Discharge: 2014-10-08 | Disposition: A | Discharge status: Home / Self Care | Payer: Medicare Other | Source: Ambulatory Visit | Attending: Radiation Oncology | Admitting: Radiation Oncology

## 2014-10-08 ENCOUNTER — Inpatient Hospital Stay: Admission: RE | Admit: 2014-10-08 | Payer: Self-pay | Source: Ambulatory Visit

## 2014-10-08 DIAGNOSIS — Z510 Encounter for antineoplastic radiation therapy: Secondary | ICD-10-CM | POA: Diagnosis not present

## 2014-10-09 ENCOUNTER — Ambulatory Visit
Admission: RE | Admit: 2014-10-09 | Discharge: 2014-10-09 | Disposition: A | Discharge status: Home / Self Care | Payer: Medicare Other | Source: Ambulatory Visit | Attending: Radiation Oncology | Admitting: Radiation Oncology

## 2014-10-09 DIAGNOSIS — Z510 Encounter for antineoplastic radiation therapy: Secondary | ICD-10-CM | POA: Diagnosis not present

## 2014-10-10 ENCOUNTER — Ambulatory Visit
Admission: RE | Admit: 2014-10-10 | Discharge: 2014-10-10 | Disposition: A | Discharge status: Home / Self Care | Payer: Medicare Other | Source: Ambulatory Visit | Attending: Radiation Oncology | Admitting: Radiation Oncology

## 2014-10-10 DIAGNOSIS — Z510 Encounter for antineoplastic radiation therapy: Secondary | ICD-10-CM | POA: Diagnosis not present

## 2014-10-11 ENCOUNTER — Ambulatory Visit
Admission: RE | Admit: 2014-10-11 | Discharge: 2014-10-11 | Disposition: A | Discharge status: Home / Self Care | Payer: Medicare Other | Source: Ambulatory Visit | Attending: Radiation Oncology | Admitting: Radiation Oncology

## 2014-10-11 ENCOUNTER — Encounter: Payer: Self-pay | Admitting: Radiation Oncology

## 2014-10-11 VITALS — BP 115/71 | HR 68 | Temp 98.2°F | Resp 20 | Wt 177.2 lb

## 2014-10-11 DIAGNOSIS — C23 Malignant neoplasm of gallbladder: Secondary | ICD-10-CM

## 2014-10-11 DIAGNOSIS — Z510 Encounter for antineoplastic radiation therapy: Secondary | ICD-10-CM | POA: Diagnosis not present

## 2014-10-11 NOTE — Progress Notes (Signed)
weekly rad rxa abdomen 5/28 completed, , radiaplex gel daily, no nausea, no pain, regul;ar bm's, appetite fair, energy level  So so 3:08 PM BP 115/71 mmHg  Pulse 68  Temp(Src) 98.2 F (36.8 C) (Oral)  Resp 20  Wt 177 lb 3.2 oz (80.377 kg)  Wt Readings from Last 3 Encounters:  10/07/14 177 lb 12.8 oz (80.65 kg)  09/25/14 175 lb 14.4 oz (79.788 kg)  09/19/14 175 lb 1.6 oz (79.425 kg)

## 2014-10-12 NOTE — Progress Notes (Signed)
   Department of Radiation Oncology  Phone:  [phone] Fax:        [phone]  Weekly Treatment Note    Name: Ronnie Nicholson Date: 10/12/2014 MRN: [id number] DOB: Mar 16, 1941   Current dose: 9 Gy  Current fraction: 5   MEDICATIONS: Current Outpatient Prescriptions  Medication Sig Dispense Refill  . aspirin EC 81 MG tablet Take 81 mg by mouth daily.    . capecitabine (XELODA) 500 MG tablet Take 3 tab (1500mg ) in AM; Take 3 tab (1500mg ) in PM. Total daily dose =3000mg .  Take on Radiation days ONLY.  Start 10/07/14 (Patient not taking: Reported on 09/25/2014) 168 tablet 0  . [START ON 10/14/2014] hyaluronate sodium (RADIAPLEXRX) GEL Apply 1 application topically daily. Apply to abdomen after rad tx once skin turns pink,or becomes irritated    . lidocaine-prilocaine (EMLA) cream Apply small amount of cream over port area 1-2 hours prior to treatment and cover with plastic wrap.  DO NOT RUB IN 30 g PRN  . metoprolol tartrate (LOPRESSOR) 25 MG tablet Take 25 mg by mouth 2 (two) times daily.    . pantoprazole (PROTONIX) 20 MG tablet Take 20 mg by mouth daily.    . pravastatin (PRAVACHOL) 20 MG tablet Take 1 tablet (20 mg total) by mouth daily. (Patient not taking: Reported on 07/23/2014) 90 tablet 1  . prochlorperazine (COMPAZINE) 10 MG tablet Take 1 tablet (10 mg total) by mouth every 6 (six) hours as needed for nausea. (Patient not taking: Reported on 09/25/2014) 60 tablet 1  . ursodiol (ACTIGALL) 300 MG capsule      No current facility-administered medications for this encounter.     ALLERGIES: Lodine; Flexeril; and Tetracyclines & related   LABORATORY DATA:  Lab Results  Component Value Date   WBC 4.2 09/25/2014   HGB 10.6* 09/25/2014   HCT 32.6* 09/25/2014   MCV 89.3 09/25/2014   PLT 231 09/25/2014   Lab Results  Component Value Date   NA 143 09/25/2014   K 4.2 09/25/2014   CL 98 09/07/2014   CO2 26 09/25/2014   Lab Results  Component Value Date   ALT 44  09/25/2014   AST 26 09/25/2014   ALKPHOS 211* 09/25/2014   BILITOT <0.20 09/25/2014     NARRATIVE: [name] was seen today for weekly treatment management. The chart was checked and the patient's films were reviewed.  weekly rad rxa abdomen 5/28 completed, , radiaplex gel daily, no nausea, no pain, regul;ar bm's, appetite fair, energy level  So so 6:45 PM BP 115/71 mmHg  Pulse 68  Temp(Src) 98.2 F (36.8 C) (Oral)  Resp 20  Wt 177 lb 3.2 oz (80.377 kg)  Wt Readings from Last 3 Encounters:  10/07/14 177 lb 12.8 oz (80.65 kg)  09/25/14 175 lb 14.4 oz (79.788 kg)  09/19/14 175 lb 1.6 oz (79.425 kg)    PHYSICAL EXAMINATION: weight is 177 lb 3.2 oz (80.377 kg). His oral temperature is 98.2 F (36.8 C). His blood pressure is 115/71 and his pulse is 68. His respiration is 20.        ASSESSMENT: The patient is doing satisfactorily with treatment.  PLAN: We will continue with the patient's radiation treatment as planned.

## 2014-10-14 ENCOUNTER — Ambulatory Visit
Admission: RE | Admit: 2014-10-14 | Discharge: 2014-10-14 | Disposition: A | Discharge status: Home / Self Care | Payer: Medicare Other | Source: Ambulatory Visit | Attending: Radiation Oncology | Admitting: Radiation Oncology

## 2014-10-14 DIAGNOSIS — Z510 Encounter for antineoplastic radiation therapy: Secondary | ICD-10-CM | POA: Diagnosis not present

## 2014-10-15 ENCOUNTER — Ambulatory Visit (HOSPITAL_BASED_OUTPATIENT_CLINIC_OR_DEPARTMENT_OTHER): Payer: Medicare Other | Admitting: Nurse Practitioner

## 2014-10-15 ENCOUNTER — Ambulatory Visit
Admission: RE | Admit: 2014-10-15 | Discharge: 2014-10-15 | Disposition: A | Discharge status: Home / Self Care | Payer: Medicare Other | Source: Ambulatory Visit | Attending: Radiation Oncology | Admitting: Radiation Oncology

## 2014-10-15 ENCOUNTER — Telehealth: Payer: Self-pay | Admitting: Nurse Practitioner

## 2014-10-15 ENCOUNTER — Other Ambulatory Visit (HOSPITAL_BASED_OUTPATIENT_CLINIC_OR_DEPARTMENT_OTHER): Payer: Medicare Other

## 2014-10-15 VITALS — BP 119/64 | HR 98 | Temp 98.0°F | Resp 18 | Ht 73.0 in | Wt 174.8 lb

## 2014-10-15 DIAGNOSIS — R748 Abnormal levels of other serum enzymes: Secondary | ICD-10-CM | POA: Diagnosis not present

## 2014-10-15 DIAGNOSIS — R1011 Right upper quadrant pain: Secondary | ICD-10-CM

## 2014-10-15 DIAGNOSIS — Z800 Family history of malignant neoplasm of digestive organs: Secondary | ICD-10-CM | POA: Diagnosis not present

## 2014-10-15 DIAGNOSIS — C23 Malignant neoplasm of gallbladder: Secondary | ICD-10-CM

## 2014-10-15 DIAGNOSIS — Z510 Encounter for antineoplastic radiation therapy: Secondary | ICD-10-CM | POA: Diagnosis not present

## 2014-10-15 DIAGNOSIS — Z809 Family history of malignant neoplasm, unspecified: Secondary | ICD-10-CM

## 2014-10-15 LAB — CBC WITH DIFFERENTIAL/PLATELET
BASO%: 0.6 % (ref 0.0–2.0)
Basophils Absolute: 0.1 10*3/uL (ref 0.0–0.1)
EOS%: 15.5 % — AB (ref 0.0–7.0)
Eosinophils Absolute: 1.3 10*3/uL — ABNORMAL HIGH (ref 0.0–0.5)
HCT: 36.6 % — ABNORMAL LOW (ref 38.4–49.9)
HGB: 11.8 g/dL — ABNORMAL LOW (ref 13.0–17.1)
LYMPH%: 11.8 % — ABNORMAL LOW (ref 14.0–49.0)
MCH: 29.6 pg (ref 27.2–33.4)
MCHC: 32.2 g/dL (ref 32.0–36.0)
MCV: 92 fL (ref 79.3–98.0)
MONO#: 0.9 10*3/uL (ref 0.1–0.9)
MONO%: 10.7 % (ref 0.0–14.0)
NEUT#: 5 10*3/uL (ref 1.5–6.5)
NEUT%: 61.4 % (ref 39.0–75.0)
Platelets: 293 10*3/uL (ref 140–400)
RBC: 3.98 10*6/uL — AB (ref 4.20–5.82)
RDW: 15.1 % — AB (ref 11.0–14.6)
WBC: 8.1 10*3/uL (ref 4.0–10.3)
lymph#: 1 10*3/uL (ref 0.9–3.3)

## 2014-10-15 NOTE — Telephone Encounter (Signed)
per pof to sch pt appt-gave pt copy of sch °

## 2014-10-15 NOTE — Progress Notes (Signed)
  [place] OFFICE PROGRESS NOTE   Diagnosis:  Gallbladder carcinoma  INTERVAL HISTORY:   Ronnie Nicholson returns as scheduled. He began adjuvant radiation/Xeloda on 10/07/2014. He denies nausea/vomiting. No mouth sores. No diarrhea. No hand or foot pain or redness. He continues to have intermittent pain at the right upper abdomen. The pain generally only last a few seconds. His wife describes the pain as "spasm-like". He at times thinks the pain is related to his diet. Overall the pain is improving.  Objective:  Vital signs in last 24 hours:  Blood pressure 119/64, pulse 98, temperature 98 F (36.7 C), temperature source Oral, resp. rate 18, height $RemoveBe'6\' 1"'SvHDABLvV$  (1.854 m), weight 174 lb 12.8 oz (79.289 kg), SpO2 99 %.    HEENT: No thrush or ulcers. Resp: Lungs clear bilaterally. Cardio: Regular rate and rhythm. GI: Abdomen soft and nontender. No hepatomegaly. No mass. Vascular: No leg edema. Calves nontender. Port-A-Cath without erythema.   Lab Results:  Lab Results  Component Value Date   WBC 8.1 10/15/2014   HGB 11.8* 10/15/2014   HCT 36.6* 10/15/2014   MCV 92.0 10/15/2014   PLT 293 10/15/2014   NEUTROABS 5.0 10/15/2014    Imaging:  No results found.  Medications: I have reviewed the patient's current medications.  Assessment/Plan: 1. 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, M0), status post a cholecystectomy 06/24/2014 with a 2.5 cm gallbladder mucosal mass with extension into attached hepatic parenchyma, positive hepatic parenchymal and cystic duct margins, diffuse lymphovascular invasion  Elevated CA 19-9  Partial liver resection, bile duct resection, lymph node resection on 08/01/2014 with the pathology confirming residual invasive adenocarcinoma in the liver-negative margins, metastatic adenocarcinoma with extranodal extension involving one lymph node  Final pathologic stage IIIB (pT3,pN1)  Initiation of adjuvant  gemcitabine 09/05/2014  Initiation of adjuvant radiation/Xeloda 10/07/2014  2. Intermittent right upper abdominal pain beginning in the summer of 2015-likely related to symptomatic cholelithiasis  3. History of colon polyps  4. Family history of multiple cancers including colon cancer, status post a genetics evaluation. Positive for the BRCA1 mutation.  5. Elevated liver enzymes 09/07/2014; improved   Disposition: [name] appears stable. He continues adjuvant radiation/Xeloda. Thus far he appears to be tolerating the Xeloda well. We again reviewed potential toxicities associated with Xeloda including mouth sores, diarrhea, hand-foot syndrome. He understands to contact the office if he develops any of these. He will return for a follow-up visit with labs and a Port-A-Cath flush in 2 weeks.  Plan reviewed with Dr. Benay Spice.    Ned Card ANP/GNP-BC   10/15/2014  2:04 PM

## 2014-10-16 ENCOUNTER — Ambulatory Visit
Admission: RE | Admit: 2014-10-16 | Discharge: 2014-10-16 | Disposition: A | Discharge status: Home / Self Care | Payer: Medicare Other | Source: Ambulatory Visit | Attending: Radiation Oncology | Admitting: Radiation Oncology

## 2014-10-16 DIAGNOSIS — Z510 Encounter for antineoplastic radiation therapy: Secondary | ICD-10-CM | POA: Diagnosis not present

## 2014-10-17 ENCOUNTER — Encounter: Payer: Self-pay | Admitting: Oncology

## 2014-10-17 ENCOUNTER — Ambulatory Visit
Admission: RE | Admit: 2014-10-17 | Discharge: 2014-10-17 | Disposition: A | Discharge status: Home / Self Care | Payer: Medicare Other | Source: Ambulatory Visit | Attending: Radiation Oncology | Admitting: Radiation Oncology

## 2014-10-17 DIAGNOSIS — Z510 Encounter for antineoplastic radiation therapy: Secondary | ICD-10-CM | POA: Diagnosis not present

## 2014-10-17 NOTE — Progress Notes (Signed)
I mailed copy of notes/labs from patient hospital stay to [facility] life ins [address] [id number] [place], [place] [phone]

## 2014-10-18 ENCOUNTER — Ambulatory Visit
Admission: RE | Admit: 2014-10-18 | Discharge: 2014-10-18 | Disposition: A | Discharge status: Home / Self Care | Payer: Medicare Other | Source: Ambulatory Visit | Attending: Radiation Oncology | Admitting: Radiation Oncology

## 2014-10-18 ENCOUNTER — Encounter: Payer: Self-pay | Admitting: Radiation Oncology

## 2014-10-18 VITALS — BP 115/61 | HR 73 | Temp 97.5°F | Resp 16 | Ht 73.0 in | Wt 173.8 lb

## 2014-10-18 DIAGNOSIS — C23 Malignant neoplasm of gallbladder: Secondary | ICD-10-CM

## 2014-10-18 DIAGNOSIS — Z510 Encounter for antineoplastic radiation therapy: Secondary | ICD-10-CM | POA: Diagnosis not present

## 2014-10-18 NOTE — Progress Notes (Signed)
Department of Radiation Oncology  Phone:  [phone] Fax:        [phone]  Weekly Treatment Note    Name: Ronnie Nicholson Date: 10/18/2014 MRN: [id number] DOB: 26-Jan-1941   Current dose: 18 Gy  Current fraction:10   MEDICATIONS: Current Outpatient Prescriptions  Medication Sig Dispense Refill  . aspirin EC 81 MG tablet Take 81 mg by mouth daily.    . capecitabine (XELODA) 500 MG tablet Take 3 tab (1500mg ) in AM; Take 3 tab (1500mg ) in PM. Total daily dose =3000mg .  Take on Radiation days ONLY.  Start 10/07/14 168 tablet 0  . lidocaine-prilocaine (EMLA) cream Apply small amount of cream over port area 1-2 hours prior to treatment and cover with plastic wrap.  DO NOT RUB IN 30 g PRN  . metoprolol tartrate (LOPRESSOR) 25 MG tablet Take 25 mg by mouth 2 (two) times daily.    . hyaluronate sodium (RADIAPLEXRX) GEL Apply 1 application topically daily. Apply to abdomen after rad tx once skin turns pink,or becomes irritated    . pantoprazole (PROTONIX) 20 MG tablet Take 20 mg by mouth daily.    . pravastatin (PRAVACHOL) 20 MG tablet Take 1 tablet (20 mg total) by mouth daily. (Patient not taking: Reported on 10/18/2014) 90 tablet 1  . prochlorperazine (COMPAZINE) 10 MG tablet Take 1 tablet (10 mg total) by mouth every 6 (six) hours as needed for nausea. (Patient not taking: Reported on 10/18/2014) 60 tablet 1  . ursodiol (ACTIGALL) 300 MG capsule      No current facility-administered medications for this encounter.     ALLERGIES: Lodine; Flexeril; and Tetracyclines & related   LABORATORY DATA:  Lab Results  Component Value Date   WBC 8.1 10/15/2014   HGB 11.8* 10/15/2014   HCT 36.6* 10/15/2014   MCV 92.0 10/15/2014   PLT 293 10/15/2014   Lab Results  Component Value Date   NA 143 09/25/2014   K 4.2 09/25/2014   CL 98 09/07/2014   CO2 26 09/25/2014   Lab Results  Component Value Date   ALT 44 09/25/2014   AST 26 09/25/2014   ALKPHOS 211* 09/25/2014   BILITOT  <0.20 09/25/2014     NARRATIVE: Ronnie Nicholson was seen today for weekly treatment management. The chart was checked and the patient's films were reviewed.  Ronnie Nicholson has completed 10 fractions to his abdomen. He reports occasional sharp pains in his left upper abdomen that lasts 10-15 seconds. He says he has had this pain since his surgery. He is taking xeloda twice a day. He reports his appetite is poor. He has lost 4 lbs since last week. He denies nausea. He reports normal bowel movements. He reports fatigue.   PHYSICAL EXAMINATION: height is 6\' 1"  (1.854 m) and weight is 173 lb 12.8 oz (78.835 kg). His oral temperature is 97.5 F (36.4 C). His blood pressure is 115/61 and his pulse is 73. His respiration is 16 and oxygen saturation is 100%.        ASSESSMENT: The patient is doing satisfactorily with treatment.  PLAN: We will continue with the patient's radiation treatment as planned.        This document serves as a record of services personally performed by Kyung Rudd, MD. It was created on his behalf by Derek Mound, a trained medical scribe. The creation of this record is based on the scribe's personal observations and the provider's statements to them. This document has been checked and approved by the  attending provider.

## 2014-10-18 NOTE — Progress Notes (Signed)
Ronnie Nicholson has completed 10 fractions to his abdomen.  He reports occasional sharp pains in his left upper abdomen.  He says he has had this pain since his surgery.  He is taking xeloda twice a day.  He reports his appetite is poor.  He has lost 4 lbs since last week.  He denies nausea.  He reports normal bowel movements.  He reports fatigue.  BP 115/61 mmHg  Pulse 73  Temp(Src) 97.5 F (36.4 C) (Oral)  Resp 16  Ht 6\' 1"  (1.854 m)  Wt 173 lb 12.8 oz (78.835 kg)  BMI 22.94 kg/m2  SpO2 100%   Wt Readings from Last 3 Encounters:  10/15/14 174 lb 12.8 oz (79.289 kg)  10/07/14 177 lb 12.8 oz (80.65 kg)  09/25/14 175 lb 14.4 oz (79.788 kg)

## 2014-10-21 ENCOUNTER — Ambulatory Visit
Admission: RE | Admit: 2014-10-21 | Discharge: 2014-10-21 | Disposition: A | Discharge status: Home / Self Care | Payer: Medicare Other | Source: Ambulatory Visit | Attending: Radiation Oncology | Admitting: Radiation Oncology

## 2014-10-21 DIAGNOSIS — Z510 Encounter for antineoplastic radiation therapy: Secondary | ICD-10-CM | POA: Diagnosis not present

## 2014-10-22 ENCOUNTER — Ambulatory Visit
Admission: RE | Admit: 2014-10-22 | Discharge: 2014-10-22 | Disposition: A | Discharge status: Home / Self Care | Payer: Medicare Other | Source: Ambulatory Visit | Attending: Radiation Oncology | Admitting: Radiation Oncology

## 2014-10-22 DIAGNOSIS — Z510 Encounter for antineoplastic radiation therapy: Secondary | ICD-10-CM | POA: Diagnosis not present

## 2014-10-23 ENCOUNTER — Ambulatory Visit
Admission: RE | Admit: 2014-10-23 | Discharge: 2014-10-23 | Disposition: A | Discharge status: Home / Self Care | Payer: Medicare Other | Source: Ambulatory Visit | Attending: Radiation Oncology | Admitting: Radiation Oncology

## 2014-10-23 ENCOUNTER — Encounter: Payer: Self-pay | Admitting: Radiation Oncology

## 2014-10-23 VITALS — BP 103/51 | HR 80 | Temp 97.6°F | Resp 12 | Wt 172.7 lb

## 2014-10-23 DIAGNOSIS — Z510 Encounter for antineoplastic radiation therapy: Secondary | ICD-10-CM | POA: Diagnosis not present

## 2014-10-23 DIAGNOSIS — C23 Malignant neoplasm of gallbladder: Secondary | ICD-10-CM

## 2014-10-23 NOTE — Progress Notes (Signed)
Department of Radiation Oncology  Phone:  [phone] Fax:        [phone]  Weekly Treatment Note    Name: Ronnie Nicholson Date: 10/23/2014 MRN: [id number] DOB: Oct 05, 1940   Current dose: 23.4 Gy  Current fraction: 13   MEDICATIONS: Current Outpatient Prescriptions  Medication Sig Dispense Refill  . aspirin EC 81 MG tablet Take 81 mg by mouth daily.    . capecitabine (XELODA) 500 MG tablet Take 3 tab (1500mg ) in AM; Take 3 tab (1500mg ) in PM. Total daily dose =3000mg .  Take on Radiation days ONLY.  Start 10/07/14 168 tablet 0  . hyaluronate sodium (RADIAPLEXRX) GEL Apply 1 application topically daily. Apply to abdomen after rad tx once skin turns pink,or becomes irritated    . lidocaine-prilocaine (EMLA) cream Apply small amount of cream over port area 1-2 hours prior to treatment and cover with plastic wrap.  DO NOT RUB IN 30 g PRN  . metoprolol tartrate (LOPRESSOR) 25 MG tablet Take 25 mg by mouth 2 (two) times daily.    . pantoprazole (PROTONIX) 20 MG tablet Take 20 mg by mouth daily.    . pravastatin (PRAVACHOL) 20 MG tablet Take 1 tablet (20 mg total) by mouth daily. (Patient not taking: Reported on 10/18/2014) 90 tablet 1  . prochlorperazine (COMPAZINE) 10 MG tablet Take 1 tablet (10 mg total) by mouth every 6 (six) hours as needed for nausea. (Patient not taking: Reported on 10/18/2014) 60 tablet 1  . ursodiol (ACTIGALL) 300 MG capsule      No current facility-administered medications for this encounter.     ALLERGIES: Lodine; Flexeril; and Tetracyclines & related   LABORATORY DATA:  Lab Results  Component Value Date   WBC 8.1 10/15/2014   HGB 11.8* 10/15/2014   HCT 36.6* 10/15/2014   MCV 92.0 10/15/2014   PLT 293 10/15/2014   Lab Results  Component Value Date   NA 143 09/25/2014   K 4.2 09/25/2014   CL 98 09/07/2014   CO2 26 09/25/2014   Lab Results  Component Value Date   ALT 44 09/25/2014   AST 26 09/25/2014   ALKPHOS 211* 09/25/2014   BILITOT <0.20 09/25/2014     NARRATIVE: Ronnie Nicholson was seen today for weekly treatment management. The chart was checked and the patient's films were reviewed.  He is currently in no pain. Pt complains of fatigue and poor appetite. Abdominal Pain-sharp pain on right side intermittent. Rated the pain a 5. Reports a soft bowel movement every day. Pt reports healed incision is non tender, warm dry and intact. Blood pressure has been recently decreasing. Dehydration has been an issue recently. "Water dose not taste good." BP 103/51 mmHg  Pulse 80  Temp(Src) 97.6 F (36.4 C) (Oral)  Resp 12  Wt 172 lb 11.2 oz (78.336 kg)  SpO2 100%  Orthostatic VS BP: 103/51 P:80 Spo2: 100%    PHYSICAL EXAMINATION: weight is 172 lb 11.2 oz (78.336 kg). His oral temperature is 97.6 F (36.4 C). His blood pressure is 103/51 and his pulse is 80. His respiration is 12 and oxygen saturation is 100%.   ASSESSMENT: The patient is doing satisfactorily with treatment.  PLAN: We will continue with the patient's radiation treatment as planned. Increase noncaffinated fluid intake. If continues to remain low, may take off of blood pressure medication.      This document serves as a record of services personally performed by Kyung Rudd, MD. It was created on his behalf by  Pearlie Oyster, a trained medical scribe. The creation of this record is based on the scribe's personal observations and the provider's statements to them. This document has been checked and approved by the attending provider.

## 2014-10-23 NOTE — Progress Notes (Signed)
He is currently in no pain. Pt complains of fatigue and poor appetite. Abdominal Pain-sharp pain on right side intermittent. Rated the pain a 5. Reports a soft bowel movement every day. Pt reports healed incision is non tender, warm dry and intact. BP 103/51 mmHg  Pulse 80  Temp(Src) 97.6 F (36.4 C) (Oral)  Resp 12  Wt 172 lb 11.2 oz (78.336 kg)  SpO2 100%  Orthostatic VS BP: 103/51 P:80 Spo2: 100%

## 2014-10-24 ENCOUNTER — Ambulatory Visit
Admission: RE | Admit: 2014-10-24 | Discharge: 2014-10-24 | Disposition: A | Discharge status: Home / Self Care | Payer: Medicare Other | Source: Ambulatory Visit | Attending: Radiation Oncology | Admitting: Radiation Oncology

## 2014-10-24 DIAGNOSIS — Z510 Encounter for antineoplastic radiation therapy: Secondary | ICD-10-CM | POA: Diagnosis not present

## 2014-10-25 ENCOUNTER — Ambulatory Visit
Admission: RE | Admit: 2014-10-25 | Discharge: 2014-10-25 | Disposition: A | Discharge status: Home / Self Care | Payer: Medicare Other | Source: Ambulatory Visit | Attending: Radiation Oncology | Admitting: Radiation Oncology

## 2014-10-25 DIAGNOSIS — Z510 Encounter for antineoplastic radiation therapy: Secondary | ICD-10-CM | POA: Diagnosis not present

## 2014-10-28 ENCOUNTER — Ambulatory Visit
Admission: RE | Admit: 2014-10-28 | Discharge: 2014-10-28 | Disposition: A | Discharge status: Home / Self Care | Payer: Medicare Other | Source: Ambulatory Visit | Attending: Radiation Oncology | Admitting: Radiation Oncology

## 2014-10-28 DIAGNOSIS — Z510 Encounter for antineoplastic radiation therapy: Secondary | ICD-10-CM | POA: Diagnosis not present

## 2014-10-29 ENCOUNTER — Ambulatory Visit (HOSPITAL_BASED_OUTPATIENT_CLINIC_OR_DEPARTMENT_OTHER): Payer: Medicare Other

## 2014-10-29 ENCOUNTER — Ambulatory Visit (HOSPITAL_BASED_OUTPATIENT_CLINIC_OR_DEPARTMENT_OTHER): Payer: Medicare Other | Admitting: Oncology

## 2014-10-29 ENCOUNTER — Telehealth: Payer: Self-pay | Admitting: Oncology

## 2014-10-29 ENCOUNTER — Other Ambulatory Visit (HOSPITAL_BASED_OUTPATIENT_CLINIC_OR_DEPARTMENT_OTHER): Payer: Medicare Other

## 2014-10-29 ENCOUNTER — Ambulatory Visit
Admission: RE | Admit: 2014-10-29 | Discharge: 2014-10-29 | Disposition: A | Discharge status: Home / Self Care | Payer: Medicare Other | Source: Ambulatory Visit | Attending: Radiation Oncology | Admitting: Radiation Oncology

## 2014-10-29 ENCOUNTER — Other Ambulatory Visit: Payer: Self-pay | Admitting: *Deleted

## 2014-10-29 VITALS — BP 117/62 | HR 78 | Temp 97.5°F | Resp 18 | Ht 73.0 in | Wt 173.3 lb

## 2014-10-29 DIAGNOSIS — Z95828 Presence of other vascular implants and grafts: Secondary | ICD-10-CM

## 2014-10-29 DIAGNOSIS — C23 Malignant neoplasm of gallbladder: Secondary | ICD-10-CM

## 2014-10-29 DIAGNOSIS — C787 Secondary malignant neoplasm of liver and intrahepatic bile duct: Secondary | ICD-10-CM

## 2014-10-29 DIAGNOSIS — Z510 Encounter for antineoplastic radiation therapy: Secondary | ICD-10-CM | POA: Diagnosis not present

## 2014-10-29 LAB — CBC WITH DIFFERENTIAL/PLATELET
BASO%: 0.1 % (ref 0.0–2.0)
BASOS ABS: 0 10*3/uL (ref 0.0–0.1)
EOS%: 6.1 % (ref 0.0–7.0)
Eosinophils Absolute: 0.5 10*3/uL (ref 0.0–0.5)
HEMATOCRIT: 36.3 % — AB (ref 38.4–49.9)
HEMOGLOBIN: 12.2 g/dL — AB (ref 13.0–17.1)
LYMPH%: 4.4 % — AB (ref 14.0–49.0)
MCH: 30.9 pg (ref 27.2–33.4)
MCHC: 33.6 g/dL (ref 32.0–36.0)
MCV: 91.9 fL (ref 79.3–98.0)
MONO#: 0.8 10*3/uL (ref 0.1–0.9)
MONO%: 9.3 % (ref 0.0–14.0)
NEUT#: 6.6 10*3/uL — ABNORMAL HIGH (ref 1.5–6.5)
NEUT%: 80.1 % — ABNORMAL HIGH (ref 39.0–75.0)
Platelets: 132 10*3/uL — ABNORMAL LOW (ref 140–400)
RBC: 3.95 10*6/uL — ABNORMAL LOW (ref 4.20–5.82)
RDW: 16.2 % — AB (ref 11.0–14.6)
WBC: 8.2 10*3/uL (ref 4.0–10.3)
lymph#: 0.4 10*3/uL — ABNORMAL LOW (ref 0.9–3.3)

## 2014-10-29 LAB — COMPREHENSIVE METABOLIC PANEL (CC13)
ALBUMIN: 3.4 g/dL — AB (ref 3.5–5.0)
ALT: 33 U/L (ref 0–55)
ANION GAP: 11 meq/L (ref 3–11)
AST: 33 U/L (ref 5–34)
Alkaline Phosphatase: 121 U/L (ref 40–150)
BUN: 14 mg/dL (ref 7.0–26.0)
CHLORIDE: 102 meq/L (ref 98–109)
CO2: 27 meq/L (ref 22–29)
CREATININE: 0.8 mg/dL (ref 0.7–1.3)
Calcium: 8.5 mg/dL (ref 8.4–10.4)
EGFR: 87 mL/min/{1.73_m2} — AB (ref >90)
GLUCOSE: 97 mg/dL (ref 70–140)
Potassium: 4 mEq/L (ref 3.5–5.1)
Sodium: 140 mEq/L (ref 136–145)
TOTAL PROTEIN: 6.2 g/dL — AB (ref 6.4–8.3)
Total Bilirubin: 0.53 mg/dL (ref 0.20–1.20)

## 2014-10-29 MED ORDER — ONDANSETRON HCL 8 MG PO TABS: ORAL_TABLET | ORAL | Status: DC

## 2014-10-29 MED ORDER — HEPARIN SOD (PORK) LOCK FLUSH 100 UNIT/ML IV SOLN
500.0000 [IU] | Freq: Once | INTRAVENOUS | Status: AC
  Administered 2014-10-29: 500 [IU] via INTRAVENOUS
  Filled 2014-10-29: qty 5

## 2014-10-29 MED ORDER — ONDANSETRON HCL 8 MG PO TABS
ORAL_TABLET | ORAL | Status: AC
  Filled 2014-10-29: qty 1

## 2014-10-29 MED ORDER — SODIUM CHLORIDE 0.9 % IJ SOLN
10.0000 mL | INTRAMUSCULAR | Status: DC | PRN
  Administered 2014-10-29: 10 mL via INTRAVENOUS
  Filled 2014-10-29: qty 10

## 2014-10-29 MED ORDER — ONDANSETRON HCL 8 MG PO TABS
8.0000 mg | ORAL_TABLET | Freq: Once | ORAL | Status: AC
  Administered 2014-10-29: 8 mg via ORAL

## 2014-10-29 NOTE — Patient Instructions (Signed)

## 2014-10-29 NOTE — Progress Notes (Signed)
  [place] OFFICE PROGRESS NOTE   Diagnosis: Gallbladder carcinoma   INTERVAL HISTORY:   Mr. Ronnie Nicholson returns as scheduled. He continues Xeloda and radiation. No mouth sores, diarrhea, or hand/foot pain. He had an episode of nausea and vomiting yesterday evening. Nausea is relieved with Compazine. He has a poor appetite.  Objective:  Vital signs in last 24 hours:  Blood pressure 117/62, pulse 78, temperature 97.5 F (36.4 C), temperature source Oral, resp. rate 18, height $RemoveBe'6\' 1"'nevDrfhQs$  (1.854 m), weight 173 lb 4.8 oz (78.608 kg), SpO2 100 %.    HEENT: No thrush or ulcers Resp: Bronchial sounds at the upper posterior chest bilaterally, no respiratory distress Cardio: Regular rate and rhythm GI: No hepatomegaly, nontender Vascular: No leg edema  Skin: Palms without erythema or skin breakdown   Portacath/PICC-without erythema  Lab Results:  Lab Results  Component Value Date   WBC 8.2 10/29/2014   HGB 12.2* 10/29/2014   HCT 36.3* 10/29/2014   MCV 91.9 10/29/2014   PLT 132* 10/29/2014   NEUTROABS 6.6* 10/29/2014     Medications: I have reviewed the patient's current medications.  Assessment/Plan: 1. 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, M0), status post a cholecystectomy 06/24/2014 with a 2.5 cm gallbladder mucosal mass with extension into attached hepatic parenchyma, positive hepatic parenchymal and cystic duct margins, diffuse lymphovascular invasion  Elevated CA 19-9  Partial liver resection, bile duct resection, lymph node resection on 08/01/2014 with the pathology confirming residual invasive adenocarcinoma in the liver-negative margins, metastatic adenocarcinoma with extranodal extension involving one lymph node  Final pathologic stage IIIB (pT3,pN1)  Initiation of adjuvant gemcitabine 09/05/2014  Initiation of adjuvant radiation/Xeloda 10/07/2014  2. Intermittent right upper abdominal pain beginning in the summer  of 2015-likely related to symptomatic cholelithiasis  3. History of colon polyps  4. Family history of multiple cancers including colon cancer, status post a genetics evaluation. Positive for the BRCA1 mutation.  5. Elevated liver enzymes 09/07/2014; improved  6.   Nausea-likely secondary to radiation, he will try Zofran prior to radiation and continue as needed Compazine  Disposition:  Mr. Harlin appears to be tolerating the chemotherapy and radiation well. We added Zofran to take prior to each radiation treatment. He will return for an office visit on 11/13/2014. The plan is to resume adjuvant gemcitabine chemotherapy on 11/28/2014.  Betsy Coder, MD  10/29/2014  10:28 AM

## 2014-10-29 NOTE — Telephone Encounter (Signed)
no vm set up...mailed pt appt sched/avs and letter °

## 2014-10-30 ENCOUNTER — Ambulatory Visit
Admission: RE | Admit: 2014-10-30 | Discharge: 2014-10-30 | Disposition: A | Discharge status: Home / Self Care | Payer: Medicare Other | Source: Ambulatory Visit | Attending: Radiation Oncology | Admitting: Radiation Oncology

## 2014-10-30 DIAGNOSIS — Z510 Encounter for antineoplastic radiation therapy: Secondary | ICD-10-CM | POA: Diagnosis not present

## 2014-10-31 ENCOUNTER — Ambulatory Visit
Admission: RE | Admit: 2014-10-31 | Discharge: 2014-10-31 | Disposition: A | Discharge status: Home / Self Care | Payer: Medicare Other | Source: Ambulatory Visit | Attending: Radiation Oncology | Admitting: Radiation Oncology

## 2014-10-31 DIAGNOSIS — Z510 Encounter for antineoplastic radiation therapy: Secondary | ICD-10-CM | POA: Diagnosis not present

## 2014-11-01 ENCOUNTER — Ambulatory Visit
Admission: RE | Admit: 2014-11-01 | Discharge: 2014-11-01 | Disposition: A | Discharge status: Home / Self Care | Payer: Medicare Other | Source: Ambulatory Visit | Attending: Radiation Oncology | Admitting: Radiation Oncology

## 2014-11-01 ENCOUNTER — Encounter: Payer: Self-pay | Admitting: Radiation Oncology

## 2014-11-01 ENCOUNTER — Telehealth: Payer: Self-pay | Admitting: *Deleted

## 2014-11-01 VITALS — BP 112/60 | HR 69 | Temp 98.5°F | Resp 20 | Wt 176.1 lb

## 2014-11-01 DIAGNOSIS — Z510 Encounter for antineoplastic radiation therapy: Secondary | ICD-10-CM | POA: Diagnosis not present

## 2014-11-01 DIAGNOSIS — C23 Malignant neoplasm of gallbladder: Secondary | ICD-10-CM

## 2014-11-01 NOTE — Telephone Encounter (Signed)
Oncology Nurse Navigator Documentation  Oncology Nurse Navigator Flowsheets 11/01/2014  Navigator Encounter Type Telephone  Treatment Phase Treatment-RT/Xeloda  Barriers/Navigation Needs No barriers at this time-nausea better with taking Zofran pre RT. Pleased that RT has been changed to only 5 more fractions.  Time Spent with Patient 5

## 2014-11-01 NOTE — Progress Notes (Signed)
Department of Radiation Oncology  Phone:  [phone] Fax:        [phone]  Weekly Treatment Note    Name: Ronnie Nicholson Date: 11/01/2014 MRN: [id number] DOB: 1940/06/08   Current dose: 36 Gy  Current fraction: 20   MEDICATIONS: Current Outpatient Prescriptions  Medication Sig Dispense Refill  . aspirin EC 81 MG tablet Take 81 mg by mouth daily.    . capecitabine (XELODA) 500 MG tablet Take 3 tab (1500mg ) in AM; Take 3 tab (1500mg ) in PM. Total daily dose =3000mg .  Take on Radiation days ONLY.  Start 10/07/14 168 tablet 0  . hyaluronate sodium (RADIAPLEXRX) GEL Apply 1 application topically daily. Apply to abdomen after rad tx once skin turns pink,or becomes irritated    . lidocaine-prilocaine (EMLA) cream Apply small amount of cream over port area 1-2 hours prior to treatment and cover with plastic wrap.  DO NOT RUB IN 30 g PRN  . metoprolol tartrate (LOPRESSOR) 25 MG tablet Take 25 mg by mouth 2 (two) times daily.    . ondansetron (ZOFRAN) 8 MG tablet Take 1 tab prior to Radiation 15 tablet 0  . pantoprazole (PROTONIX) 20 MG tablet Take 20 mg by mouth daily.    . pravastatin (PRAVACHOL) 20 MG tablet Take 1 tablet (20 mg total) by mouth daily. (Patient not taking: Reported on 10/18/2014) 90 tablet 1  . prochlorperazine (COMPAZINE) 10 MG tablet Take 1 tablet (10 mg total) by mouth every 6 (six) hours as needed for nausea. (Patient not taking: Reported on 10/18/2014) 60 tablet 1  . ursodiol (ACTIGALL) 300 MG capsule      No current facility-administered medications for this encounter.     ALLERGIES: Lodine; Flexeril; and Tetracyclines & related   LABORATORY DATA:  Lab Results  Component Value Date   WBC 8.2 10/29/2014   HGB 12.2* 10/29/2014   HCT 36.3* 10/29/2014   MCV 91.9 10/29/2014   PLT 132* 10/29/2014   Lab Results  Component Value Date   NA 140 10/29/2014   K 4.0 10/29/2014   CL 98 09/07/2014   CO2 27 10/29/2014   Lab Results  Component Value Date     ALT 33 10/29/2014   AST 33 10/29/2014   ALKPHOS 121 10/29/2014   BILITOT 0.53 10/29/2014     NARRATIVE: Ronnie Nicholson was seen today for weekly treatment management. The chart was checked and the patient's films were reviewed.  Weekly rad txs abdomen, a lot of belching stated, takes zofran 30 min before coming helps with nausea stated, appetite is better, energy level mild fatigue, drinking supplements ensure, carnation instant breakfast 12:00 PM BP 112/60 mmHg  Pulse 69  Temp(Src) 98.5 F (36.9 C) (Oral)  Resp 20  Wt 176 lb 1.6 oz (79.878 kg)  SpO2 100%  Wt Readings from Last 3 Encounters:  10/29/14 173 lb 4.8 oz (78.608 kg)  10/15/14 174 lb 12.8 oz (79.289 kg)  10/07/14 177 lb 12.8 oz (80.65 kg)    PHYSICAL EXAMINATION: weight is 176 lb 1.6 oz (79.878 kg). His oral temperature is 98.5 F (36.9 C). His blood pressure is 112/60 and his pulse is 69. His respiration is 20 and oxygen saturation is 100%.        ASSESSMENT: The patient is doing satisfactorily with treatment.  PLAN: We will continue with the patient's radiation treatment as planned. We will finish the patient's treatment after 25 fractions. In reviewing his plan, treating to a boost area would go beyond our  typical guidelines for normal tissue tolerance. I discussed this with the patient and he is pleased that he will be finishing after 5 additional fractions.

## 2014-11-01 NOTE — Progress Notes (Addendum)
Weekly rad txs abdomen, a lot of belching stated, takes zofran 30 min before coming helps with nausea stated, appetite is better, energy level mild fatigue, drinking supplements ensure, carnation instant breakfast 11:28 AM BP 112/60 mmHg  Pulse 69  Temp(Src) 98.5 F (36.9 C) (Oral)  Resp 20  Wt 176 lb 1.6 oz (79.878 kg)  SpO2 100%  Wt Readings from Last 3 Encounters:  10/29/14 173 lb 4.8 oz (78.608 kg)  10/15/14 174 lb 12.8 oz (79.289 kg)  10/07/14 177 lb 12.8 oz (80.65 kg)

## 2014-11-05 ENCOUNTER — Ambulatory Visit
Admission: RE | Admit: 2014-11-05 | Discharge: 2014-11-05 | Disposition: A | Discharge status: Home / Self Care | Payer: Medicare Other | Source: Ambulatory Visit | Attending: Radiation Oncology | Admitting: Radiation Oncology

## 2014-11-05 DIAGNOSIS — Z510 Encounter for antineoplastic radiation therapy: Secondary | ICD-10-CM | POA: Diagnosis not present

## 2014-11-06 ENCOUNTER — Ambulatory Visit
Admission: RE | Admit: 2014-11-06 | Discharge: 2014-11-06 | Disposition: A | Discharge status: Home / Self Care | Payer: Medicare Other | Source: Ambulatory Visit | Attending: Radiation Oncology | Admitting: Radiation Oncology

## 2014-11-06 DIAGNOSIS — Z510 Encounter for antineoplastic radiation therapy: Secondary | ICD-10-CM | POA: Diagnosis not present

## 2014-11-07 ENCOUNTER — Ambulatory Visit
Admission: RE | Admit: 2014-11-07 | Discharge: 2014-11-07 | Disposition: A | Discharge status: Home / Self Care | Payer: Medicare Other | Source: Ambulatory Visit | Attending: Radiation Oncology | Admitting: Radiation Oncology

## 2014-11-07 DIAGNOSIS — Z510 Encounter for antineoplastic radiation therapy: Secondary | ICD-10-CM | POA: Diagnosis not present

## 2014-11-08 ENCOUNTER — Encounter: Payer: Self-pay | Admitting: Radiation Oncology

## 2014-11-08 ENCOUNTER — Ambulatory Visit
Admission: RE | Admit: 2014-11-08 | Discharge: 2014-11-08 | Disposition: A | Discharge status: Home / Self Care | Payer: Medicare Other | Source: Ambulatory Visit | Attending: Radiation Oncology | Admitting: Radiation Oncology

## 2014-11-08 VITALS — BP 115/63 | HR 80 | Temp 97.7°F | Resp 20 | Wt 172.5 lb

## 2014-11-08 DIAGNOSIS — Z510 Encounter for antineoplastic radiation therapy: Secondary | ICD-10-CM | POA: Diagnosis not present

## 2014-11-08 DIAGNOSIS — C23 Malignant neoplasm of gallbladder: Secondary | ICD-10-CM

## 2014-11-08 NOTE — Progress Notes (Signed)
Department of Radiation Oncology  Phone:  [phone] Fax:        [phone]  Weekly Treatment Note    Name: Ronnie Nicholson Date: 11/08/2014 MRN: [id number] DOB: Mar 16, 1941   Current dose: 43.2 Gy  Current fraction: 24   MEDICATIONS: Current Outpatient Prescriptions  Medication Sig Dispense Refill  . aspirin EC 81 MG tablet Take 81 mg by mouth daily.    . capecitabine (XELODA) 500 MG tablet Take 3 tab (1500mg ) in AM; Take 3 tab (1500mg ) in PM. Total daily dose =3000mg .  Take on Radiation days ONLY.  Start 10/07/14 168 tablet 0  . hyaluronate sodium (RADIAPLEXRX) GEL Apply 1 application topically daily. Apply to abdomen after rad tx once skin turns pink,or becomes irritated    . lidocaine-prilocaine (EMLA) cream Apply small amount of cream over port area 1-2 hours prior to treatment and cover with plastic wrap.  DO NOT RUB IN 30 g PRN  . metoprolol tartrate (LOPRESSOR) 25 MG tablet Take 25 mg by mouth 2 (two) times daily.    . ondansetron (ZOFRAN) 8 MG tablet Take 1 tab prior to Radiation 15 tablet 0  . pantoprazole (PROTONIX) 20 MG tablet Take 20 mg by mouth daily.    . prochlorperazine (COMPAZINE) 10 MG tablet Take 1 tablet (10 mg total) by mouth every 6 (six) hours as needed for nausea. 60 tablet 1  . ursodiol (ACTIGALL) 300 MG capsule     . pravastatin (PRAVACHOL) 20 MG tablet Take 1 tablet (20 mg total) by mouth daily. (Patient not taking: Reported on 10/18/2014) 90 tablet 1   No current facility-administered medications for this encounter.     ALLERGIES: Lodine; Flexeril; and Tetracyclines & related   LABORATORY DATA:  Lab Results  Component Value Date   WBC 8.2 10/29/2014   HGB 12.2* 10/29/2014   HCT 36.3* 10/29/2014   MCV 91.9 10/29/2014   PLT 132* 10/29/2014   Lab Results  Component Value Date   NA 140 10/29/2014   K 4.0 10/29/2014   CL 98 09/07/2014   CO2 27 10/29/2014   Lab Results  Component Value Date   ALT 33 10/29/2014   AST 33 10/29/2014     ALKPHOS 121 10/29/2014   BILITOT 0.53 10/29/2014     NARRATIVE: [name] was seen today for weekly treatment management. The chart was checked and the patient's films were reviewed.  Weekly rad xts 24 completd abdomen, nausea and belching , washed out feeling weakness stated, after rad txs feels like his abdomen is rolling around:war zone sated' no diarrhea, was constipated took dulcolax and resolved  Had good bowel movement, no skin irritation on abdomen, uses radiaplex occasionally 12:22 PM BP 115/63 mmHg  Pulse 80  Temp(Src) 97.7 F (36.5 C) (Oral)  Resp 20  Wt 172 lb 8 oz (78.245 kg)  SpO2 100%  Wt Readings from Last 3 Encounters:  10/29/14 173 lb 4.8 oz (78.608 kg)  10/15/14 174 lb 12.8 oz (79.289 kg)  10/07/14 177 lb 12.8 oz (80.65 kg)    PHYSICAL EXAMINATION: weight is 172 lb 8 oz (78.245 kg). His oral temperature is 97.7 F (36.5 C). His blood pressure is 115/63 and his pulse is 80. His respiration is 20 and oxygen saturation is 100%.        ASSESSMENT: The patient is doing satisfactorily with treatment.  PLAN: We will continue with the patient's radiation treatment as planned. The patient has done very well. He will follow-up in my clinic  in 1 month.

## 2014-11-08 NOTE — Progress Notes (Addendum)
Weekly rad xts 24 completd abdomen, nausea and belching , washed out feeling weakness stated, after rad txs feels like his abdomen is rolling around:war zone sated' no diarrhea, was constipated took dulcolax and resolved  Had good bowel movement, no skin irritation on abdomen, uses radiaplex occasionally 11:31 AM BP 115/63 mmHg  Pulse 80  Temp(Src) 97.7 F (36.5 C) (Oral)  Resp 20  Wt 172 lb 8 oz (78.245 kg)  SpO2 100%  Wt Readings from Last 3 Encounters:  10/29/14 173 lb 4.8 oz (78.608 kg)  10/15/14 174 lb 12.8 oz (79.289 kg)  10/07/14 177 lb 12.8 oz (80.65 kg)

## 2014-11-11 ENCOUNTER — Ambulatory Visit
Admission: RE | Admit: 2014-11-11 | Discharge: 2014-11-11 | Disposition: A | Discharge status: Home / Self Care | Payer: Medicare Other | Source: Ambulatory Visit | Attending: Radiation Oncology | Admitting: Radiation Oncology

## 2014-11-11 ENCOUNTER — Encounter: Payer: Self-pay | Admitting: Radiation Oncology

## 2014-11-11 DIAGNOSIS — Z510 Encounter for antineoplastic radiation therapy: Secondary | ICD-10-CM | POA: Diagnosis not present

## 2014-11-12 ENCOUNTER — Ambulatory Visit: Payer: Medicare Other

## 2014-11-13 ENCOUNTER — Ambulatory Visit (HOSPITAL_BASED_OUTPATIENT_CLINIC_OR_DEPARTMENT_OTHER): Payer: Medicare Other | Admitting: Nurse Practitioner

## 2014-11-13 ENCOUNTER — Telehealth: Payer: Self-pay | Admitting: Nurse Practitioner

## 2014-11-13 ENCOUNTER — Telehealth: Payer: Self-pay | Admitting: *Deleted

## 2014-11-13 ENCOUNTER — Ambulatory Visit: Payer: Medicare Other

## 2014-11-13 ENCOUNTER — Other Ambulatory Visit (HOSPITAL_BASED_OUTPATIENT_CLINIC_OR_DEPARTMENT_OTHER): Payer: Medicare Other

## 2014-11-13 VITALS — BP 108/48 | HR 75 | Temp 97.6°F | Resp 18 | Ht 73.0 in | Wt 172.4 lb

## 2014-11-13 DIAGNOSIS — C787 Secondary malignant neoplasm of liver and intrahepatic bile duct: Secondary | ICD-10-CM | POA: Diagnosis not present

## 2014-11-13 DIAGNOSIS — C23 Malignant neoplasm of gallbladder: Secondary | ICD-10-CM

## 2014-11-13 LAB — CBC WITH DIFFERENTIAL/PLATELET
BASO%: 0.3 % (ref 0.0–2.0)
Basophils Absolute: 0 10*3/uL (ref 0.0–0.1)
EOS ABS: 0.2 10*3/uL (ref 0.0–0.5)
EOS%: 3.8 % (ref 0.0–7.0)
HCT: 34.9 % — ABNORMAL LOW (ref 38.4–49.9)
HGB: 11.9 g/dL — ABNORMAL LOW (ref 13.0–17.1)
LYMPH%: 5.5 % — AB (ref 14.0–49.0)
MCH: 31.8 pg (ref 27.2–33.4)
MCHC: 34.1 g/dL (ref 32.0–36.0)
MCV: 93.3 fL (ref 79.3–98.0)
MONO#: 0.7 10*3/uL (ref 0.1–0.9)
MONO%: 11.6 % (ref 0.0–14.0)
NEUT#: 4.8 10*3/uL (ref 1.5–6.5)
NEUT%: 78.8 % — ABNORMAL HIGH (ref 39.0–75.0)
Platelets: 160 10*3/uL (ref 140–400)
RBC: 3.74 10*6/uL — ABNORMAL LOW (ref 4.20–5.82)
RDW: 18.3 % — AB (ref 11.0–14.6)
WBC: 6 10*3/uL (ref 4.0–10.3)
lymph#: 0.3 10*3/uL — ABNORMAL LOW (ref 0.9–3.3)
nRBC: 0 % (ref 0–0)

## 2014-11-13 NOTE — Telephone Encounter (Signed)
Per staff message and POF I have scheduled appts. Advised scheduler of appts. JMW  

## 2014-11-13 NOTE — Progress Notes (Signed)
  [place] OFFICE PROGRESS NOTE   Diagnosis:  Gallbladder carcinoma  INTERVAL HISTORY:   Ronnie Nicholson returns as scheduled. He completed radiation/Xeloda 11/11/2014. The nausea overall is better. He tends to have mild nausea in the evening hours. No vomiting. No mouth sores. No diarrhea. No hand or foot pain or redness. No abdominal pain.  Objective:  Vital signs in last 24 hours:  Blood pressure 108/48, pulse 75, temperature 97.6 F (36.4 C), temperature source Oral, resp. rate 18, height 6' 1" (1.854 m), weight 172 lb 6.4 oz (78.2 kg), SpO2 100 %.   Well appearing elderly gentleman. Vascular: No leg edema. Neuro: Alert and oriented. Gait normal. Skin: Palms without erythema.    Lab Results:  Lab Results  Component Value Date   WBC 6.0 11/13/2014   HGB 11.9* 11/13/2014   HCT 34.9* 11/13/2014   MCV 93.3 11/13/2014   PLT 160 11/13/2014   NEUTROABS 4.8 11/13/2014    Imaging:  No results found.  Medications: I have reviewed the patient's current medications.  Assessment/Plan: 1. 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, M0), status post a cholecystectomy 06/24/2014 with a 2.5 cm gallbladder mucosal mass with extension into attached hepatic parenchyma, positive hepatic parenchymal and cystic duct margins, diffuse lymphovascular invasion  Elevated CA 19-9  Partial liver resection, bile duct resection, lymph node resection on 08/01/2014 with the pathology confirming residual invasive adenocarcinoma in the liver-negative margins, metastatic adenocarcinoma with extranodal extension involving one lymph node  Final pathologic stage IIIB (pT3,pN1)  Initiation of adjuvant gemcitabine 09/05/2014  Initiation of adjuvant radiation/Xeloda 10/07/2014; completed 11/11/2014  2. Intermittent right upper abdominal pain beginning in the summer of 2015-likely related to symptomatic cholelithiasis. Improved.  3. History of colon  polyps  4. Family history of multiple cancers including colon cancer, status post a genetics evaluation. Positive for the BRCA1 mutation.  5. Elevated liver enzymes 09/07/2014; improved  6. Nausea-likely secondary to radiation, he will try Zofran prior to radiation and continue as needed Compazine   Disposition: Ronnie Nicholson appears stable. He has completed the course of radiation/Xeloda. The plan is to resume gemcitabine 11/28/2014. He will be seen in follow-up prior to treatment that day.    Ronnie Nicholson ANP/GNP-BC   11/13/2014  2:39 PM

## 2014-11-13 NOTE — Telephone Encounter (Signed)
Pt confirmed labs/ov per 06/08 POF, gave pt AVS and Calendar..... KJ, sent msg to add chemo °

## 2014-11-14 ENCOUNTER — Ambulatory Visit: Payer: Medicare Other

## 2014-11-22 NOTE — Progress Notes (Addendum)
  Radiation Oncology         [phone] ________________________________  Name: Ronnie Nicholson MRN: [id number]  Date: 09/27/2014  DOB: [birth year]  SIMULATION AND TREATMENT PLANNING NOTE  DIAGNOSIS:  Gallbladder cancer  Site:  abdomen  NARRATIVE:  The patient was brought to the [facility].  Identity was confirmed.  All relevant records and images related to the planned course of therapy were reviewed.   Written consent to proceed with treatment was confirmed which was freely given after reviewing the details related to the planned course of therapy had been reviewed with the patient.  Then, the patient was set-up in a stable reproducible  supine position for radiation therapy.  CT images were obtained.  Surface markings were placed.    Medically necessary complex treatment device(s) for immobilization:  Customized vac-lock bag.   The CT images were loaded into the planning software.  Then the target and avoidance structures were contoured.  Treatment planning then occurred.  The radiation prescription was entered and confirmed.  A total of 7 complex treatment devices were fabricated which relate to the designed radiation treatment fields. Each of these customized fields/ complex treatment devices will be used on a daily basis during the radiation course. I have requested : 3D Simulation  I have requested a DVH of the following structures: target, cord, liver, bowel.   The patient will undergo daily image guidance to ensure accurate localization of the target, and adequate minimize dose to the normal surrounding structures in close proximity to the target.  PLAN:  The patient will receive 45 Gy in 25 fractions.   Special treatment procedure The patient will also receive concurrent chemotherapy during the treatment. The patient may therefore experience increased toxicity or side effects and the patient will be monitored for such problems. This may require extra lab work as  necessary. This therefore constitutes a special treatment procedure.  ________________________________   Jodelle Gross, MD, PhD

## 2014-11-22 NOTE — Progress Notes (Signed)
  Radiation Oncology         [phone] ________________________________  Name: Ronnie Nicholson MRN: [id number]  Date: 11/11/2014  DOB: 1940-06-12  End of Treatment Note  Diagnosis:    Gallbladder cancer   Staging form: Gallbladder, AJCC 7th Edition     Clinical: Stage IIIA (T3, N0, M0) - Signed by Ladell Pier, MD on 07/16/2014     Pathologic: Stage IIIB (T3, N1, cM0) - Signed by Ladell Pier, MD on 08/22/2014     Indication for treatment::  curative       Radiation treatment dates:   10/07/14 - 11/11/14  Site/dose:   The patient was treated to the abdomen to a dose of 45 Gy in 25 fractions using a 7-field 3D conformal technique. Daily image guidance was used and concurrent chemotherapy was given.  Narrative: The patient tolerated radiation treatment relatively well.   The patient had minimal nausea.  Plan: The patient has completed radiation treatment. The patient will return to radiation oncology clinic for routine followup in one month. I advised the patient to call or return sooner if they have any questions or concerns related to their recovery or treatment. ________________________________  Jodelle Gross, M.D., Ph.D.

## 2014-11-22 NOTE — Addendum Note (Signed)
Encounter addended by: Kyung Rudd, MD on: 11/22/2014  9:38 AM<BR>     Documentation filed: Notes Section

## 2014-11-22 NOTE — Addendum Note (Signed)
Encounter addended by: Kyung Rudd, MD on: 11/22/2014  9:34 AM<BR>     Documentation filed: Notes Section, Visit Diagnoses

## 2014-11-24 ENCOUNTER — Other Ambulatory Visit: Payer: Self-pay | Admitting: Oncology

## 2014-11-28 ENCOUNTER — Other Ambulatory Visit (HOSPITAL_BASED_OUTPATIENT_CLINIC_OR_DEPARTMENT_OTHER): Payer: Medicare Other

## 2014-11-28 ENCOUNTER — Ambulatory Visit (HOSPITAL_BASED_OUTPATIENT_CLINIC_OR_DEPARTMENT_OTHER): Payer: Medicare Other

## 2014-11-28 ENCOUNTER — Telehealth: Payer: Self-pay | Admitting: Oncology

## 2014-11-28 ENCOUNTER — Ambulatory Visit (HOSPITAL_BASED_OUTPATIENT_CLINIC_OR_DEPARTMENT_OTHER): Payer: Medicare Other | Admitting: Oncology

## 2014-11-28 VITALS — BP 122/56 | HR 70 | Temp 98.1°F | Resp 18 | Ht 73.0 in | Wt 172.9 lb

## 2014-11-28 DIAGNOSIS — C23 Malignant neoplasm of gallbladder: Secondary | ICD-10-CM | POA: Diagnosis present

## 2014-11-28 DIAGNOSIS — C787 Secondary malignant neoplasm of liver and intrahepatic bile duct: Secondary | ICD-10-CM

## 2014-11-28 DIAGNOSIS — Z5111 Encounter for antineoplastic chemotherapy: Secondary | ICD-10-CM

## 2014-11-28 LAB — CBC WITH DIFFERENTIAL/PLATELET
BASO%: 0.5 % (ref 0.0–2.0)
Basophils Absolute: 0 10*3/uL (ref 0.0–0.1)
EOS%: 2.5 % (ref 0.0–7.0)
Eosinophils Absolute: 0.1 10*3/uL (ref 0.0–0.5)
HEMATOCRIT: 35.5 % — AB (ref 38.4–49.9)
HEMOGLOBIN: 11.9 g/dL — AB (ref 13.0–17.1)
LYMPH%: 18.6 % (ref 14.0–49.0)
MCH: 32.2 pg (ref 27.2–33.4)
MCHC: 33.5 g/dL (ref 32.0–36.0)
MCV: 96.2 fL (ref 79.3–98.0)
MONO#: 0.6 10*3/uL (ref 0.1–0.9)
MONO%: 12.8 % (ref 0.0–14.0)
NEUT%: 65.6 % (ref 39.0–75.0)
NEUTROS ABS: 2.9 10*3/uL (ref 1.5–6.5)
PLATELETS: 127 10*3/uL — AB (ref 140–400)
RBC: 3.69 10*6/uL — AB (ref 4.20–5.82)
RDW: 18.2 % — ABNORMAL HIGH (ref 11.0–14.6)
WBC: 4.4 10*3/uL (ref 4.0–10.3)
lymph#: 0.8 10*3/uL — ABNORMAL LOW (ref 0.9–3.3)

## 2014-11-28 LAB — COMPREHENSIVE METABOLIC PANEL (CC13)
ALT: 43 U/L (ref 0–55)
ANION GAP: 5 meq/L (ref 3–11)
AST: 43 U/L — AB (ref 5–34)
Albumin: 3.2 g/dL — ABNORMAL LOW (ref 3.5–5.0)
Alkaline Phosphatase: 147 U/L (ref 40–150)
BUN: 12.9 mg/dL (ref 7.0–26.0)
CALCIUM: 8.8 mg/dL (ref 8.4–10.4)
CHLORIDE: 104 meq/L (ref 98–109)
CO2: 31 mEq/L — ABNORMAL HIGH (ref 22–29)
CREATININE: 0.8 mg/dL (ref 0.7–1.3)
EGFR: 85 mL/min/{1.73_m2} — ABNORMAL LOW (ref >90)
GLUCOSE: 97 mg/dL (ref 70–140)
POTASSIUM: 4.1 meq/L (ref 3.5–5.1)
SODIUM: 140 meq/L (ref 136–145)
Total Bilirubin: 0.39 mg/dL (ref 0.20–1.20)
Total Protein: 6.1 g/dL — ABNORMAL LOW (ref 6.4–8.3)

## 2014-11-28 MED ORDER — SODIUM CHLORIDE 0.9 % IJ SOLN
10.0000 mL | INTRAMUSCULAR | Status: DC | PRN
Start: 2014-11-28 — End: 2014-11-28
  Administered 2014-11-28: 10 mL
  Filled 2014-11-28: qty 10

## 2014-11-28 MED ORDER — PROCHLORPERAZINE MALEATE 10 MG PO TABS
ORAL_TABLET | ORAL | Status: AC
  Filled 2014-11-28: qty 1

## 2014-11-28 MED ORDER — HEPARIN SOD (PORK) LOCK FLUSH 100 UNIT/ML IV SOLN
500.0000 [IU] | Freq: Once | INTRAVENOUS | Status: AC | PRN
  Administered 2014-11-28: 500 [IU]
  Filled 2014-11-28: qty 5

## 2014-11-28 MED ORDER — SODIUM CHLORIDE 0.9 % IV SOLN
800.0000 mg/m2 | Freq: Once | INTRAVENOUS | Status: AC
  Administered 2014-11-28: 1634 mg via INTRAVENOUS
  Filled 2014-11-28: qty 43

## 2014-11-28 MED ORDER — SODIUM CHLORIDE 0.9 % IV SOLN
Freq: Once | INTRAVENOUS | Status: AC
  Administered 2014-11-28: 12:00:00 via INTRAVENOUS

## 2014-11-28 MED ORDER — PROCHLORPERAZINE MALEATE 10 MG PO TABS
10.0000 mg | ORAL_TABLET | Freq: Once | ORAL | Status: AC
  Administered 2014-11-28: 10 mg via ORAL

## 2014-11-28 NOTE — Telephone Encounter (Signed)
Gave and printed appt sched and avs for pt for June and July  °

## 2014-11-28 NOTE — Progress Notes (Signed)
  [place] OFFICE PROGRESS NOTE   Diagnosis: Pancreas cancer  INTERVAL HISTORY:   Ronnie Nicholson returns as scheduled. He feels well. He is working on his farm. He completed Xeloda and radiation on 11/11/2014. No mouth sores or diarrhea. He had an episode of upper abdominal pain last week lasting for several hours.  Objective:  Vital signs in last 24 hours:  Blood pressure 122/56, pulse 70, temperature 98.1 F (36.7 C), temperature source Oral, resp. rate 18, height $RemoveBe'6\' 1"'rWEzdpOwc$  (1.854 m), weight 172 lb 14.4 oz (78.427 kg), SpO2 100 %.    HEENT: No thrush or ulcers Resp: Lungs clear bilaterally Cardio: Regular rate and rhythm GI: No hepatomegaly, no apparent ascites, nodular scar tissue surrounding the right upper quadrant scar with mild tenderness at the midportion of the scar. Vascular: No leg edema  Portacath/PICC-without erythema, 1-2 mm "pimple "superior and medial to the Port-A-Cath  Lab Results:  Lab Results  Component Value Date   WBC 4.4 11/28/2014   HGB 11.9* 11/28/2014   HCT 35.5* 11/28/2014   MCV 96.2 11/28/2014   PLT 127* 11/28/2014   NEUTROABS 2.9 11/28/2014     Lab Results  Component Value Date   CEA 1.8 06/26/2014     Medications: I have reviewed the patient's current medications.  Assessment/Plan: 1. 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, M0), status post a cholecystectomy 06/24/2014 with a 2.5 cm gallbladder mucosal mass with extension into attached hepatic parenchyma, positive hepatic parenchymal and cystic duct margins, diffuse lymphovascular invasion  Elevated CA 19-9  Partial liver resection, bile duct resection, lymph node resection on 08/01/2014 with the pathology confirming residual invasive adenocarcinoma in the liver-negative margins, metastatic adenocarcinoma with extranodal extension involving one lymph node  Final pathologic stage IIIB (pT3,pN1)  Initiation of adjuvant gemcitabine  09/05/2014  Initiation of adjuvant radiation/Xeloda 10/07/2014; completed 11/11/2014  Resumption of adjuvant gemcitabine 11/28/2014  2. Intermittent right upper abdominal pain beginning in the summer of 2015-likely related to symptomatic cholelithiasis. Improved.  3. History of colon polyps  4. Family history of multiple cancers including colon cancer, status post a genetics evaluation. Positive for the BRCA1 mutation.  5. Elevated liver enzymes 09/07/2014; improved   Disposition:  Ronnie Nicholson appears well. The plan is to proceed with adjuvant gemcitabine chemotherapy today. He will complete 3 weeks of gemcitabine prior to a one-week break. He will return for an office visit prior to the next cycle of gemcitabine on 12/26/2014.  Betsy Coder, MD  11/28/2014  11:09 AM

## 2014-11-28 NOTE — Patient Instructions (Signed)
[facility] Discharge Instructions for Patients Receiving Chemotherapy  Today you received the following chemotherapy agents: Gemzar  To help prevent nausea and vomiting after your treatment, we encourage you to take your nausea medication as prescribed by your physician.    If you develop nausea and vomiting that is not controlled by your nausea medication, call the clinic.   BELOW ARE SYMPTOMS THAT SHOULD BE REPORTED IMMEDIATELY:  *FEVER GREATER THAN 100.5 F  *CHILLS WITH OR WITHOUT FEVER  NAUSEA AND VOMITING THAT IS NOT CONTROLLED WITH YOUR NAUSEA MEDICATION  *UNUSUAL SHORTNESS OF BREATH  *UNUSUAL BRUISING OR BLEEDING  TENDERNESS IN MOUTH AND THROAT WITH OR WITHOUT PRESENCE OF ULCERS  *URINARY PROBLEMS  *BOWEL PROBLEMS  UNUSUAL RASH Items with * indicate a potential emergency and should be followed up as soon as possible.  Feel free to call the clinic you have any questions or concerns. The clinic phone number is [phone].  Please show the CHEMO ALERT CARD at check-in to the Emergency Department and triage nurse.   

## 2014-12-05 ENCOUNTER — Other Ambulatory Visit: Payer: Self-pay | Admitting: Nurse Practitioner

## 2014-12-05 ENCOUNTER — Ambulatory Visit: Payer: Medicare Other

## 2014-12-05 ENCOUNTER — Ambulatory Visit (HOSPITAL_BASED_OUTPATIENT_CLINIC_OR_DEPARTMENT_OTHER): Payer: Medicare Other

## 2014-12-05 ENCOUNTER — Other Ambulatory Visit (HOSPITAL_BASED_OUTPATIENT_CLINIC_OR_DEPARTMENT_OTHER): Payer: Medicare Other

## 2014-12-05 DIAGNOSIS — C787 Secondary malignant neoplasm of liver and intrahepatic bile duct: Secondary | ICD-10-CM

## 2014-12-05 DIAGNOSIS — Z452 Encounter for adjustment and management of vascular access device: Secondary | ICD-10-CM | POA: Diagnosis present

## 2014-12-05 DIAGNOSIS — C23 Malignant neoplasm of gallbladder: Secondary | ICD-10-CM | POA: Diagnosis present

## 2014-12-05 DIAGNOSIS — Z95828 Presence of other vascular implants and grafts: Secondary | ICD-10-CM

## 2014-12-05 LAB — CBC WITH DIFFERENTIAL/PLATELET
BASO%: 1.1 % (ref 0.0–2.0)
Basophils Absolute: 0 10*3/uL (ref 0.0–0.1)
EOS%: 1.5 % (ref 0.0–7.0)
Eosinophils Absolute: 0 10*3/uL (ref 0.0–0.5)
HCT: 30.8 % — ABNORMAL LOW (ref 38.4–49.9)
HGB: 10.3 g/dL — ABNORMAL LOW (ref 13.0–17.1)
LYMPH%: 33.3 % (ref 14.0–49.0)
MCH: 32.5 pg (ref 27.2–33.4)
MCHC: 33.5 g/dL (ref 32.0–36.0)
MCV: 96.9 fL (ref 79.3–98.0)
MONO#: 0.2 10*3/uL (ref 0.1–0.9)
MONO%: 12.3 % (ref 0.0–14.0)
NEUT#: 1 10*3/uL — ABNORMAL LOW (ref 1.5–6.5)
NEUT%: 51.8 % (ref 39.0–75.0)
PLATELETS: 118 10*3/uL — AB (ref 140–400)
RBC: 3.18 10*6/uL — ABNORMAL LOW (ref 4.20–5.82)
RDW: 19.2 % — ABNORMAL HIGH (ref 11.0–14.6)
WBC: 1.9 10*3/uL — ABNORMAL LOW (ref 4.0–10.3)
lymph#: 0.6 10*3/uL — ABNORMAL LOW (ref 0.9–3.3)

## 2014-12-05 LAB — COMPREHENSIVE METABOLIC PANEL (CC13)
ALBUMIN: 3.1 g/dL — AB (ref 3.5–5.0)
ALK PHOS: 242 U/L — AB (ref 40–150)
ALT: 64 U/L — ABNORMAL HIGH (ref 0–55)
AST: 57 U/L — ABNORMAL HIGH (ref 5–34)
Anion Gap: 6 mEq/L (ref 3–11)
BUN: 13.9 mg/dL (ref 7.0–26.0)
CO2: 27 meq/L (ref 22–29)
Calcium: 8.9 mg/dL (ref 8.4–10.4)
Chloride: 106 mEq/L (ref 98–109)
Creatinine: 0.8 mg/dL (ref 0.7–1.3)
EGFR: 88 mL/min/{1.73_m2} — ABNORMAL LOW (ref >90)
Glucose: 104 mg/dl (ref 70–140)
POTASSIUM: 4.2 meq/L (ref 3.5–5.1)
Sodium: 139 mEq/L (ref 136–145)
Total Bilirubin: 0.32 mg/dL (ref 0.20–1.20)
Total Protein: 6.1 g/dL — ABNORMAL LOW (ref 6.4–8.3)

## 2014-12-05 MED ORDER — HEPARIN SOD (PORK) LOCK FLUSH 100 UNIT/ML IV SOLN
500.0000 [IU] | Freq: Once | INTRAVENOUS | Status: AC
  Administered 2014-12-05: 500 [IU] via INTRAVENOUS
  Filled 2014-12-05: qty 5

## 2014-12-05 MED ORDER — SODIUM CHLORIDE 0.9 % IJ SOLN
10.0000 mL | INTRAMUSCULAR | Status: DC | PRN
  Administered 2014-12-05: 10 mL via INTRAVENOUS
  Filled 2014-12-05: qty 10

## 2014-12-05 NOTE — Patient Instructions (Signed)

## 2014-12-05 NOTE — Progress Notes (Deleted)
ANC 1.0.  Per Amy H., RN we will hold today's treatment.

## 2014-12-05 NOTE — Progress Notes (Signed)
CBC reviewed with Dr Benay Spice ANC 1.0, WBC 1.9; order given to hold treatment today.  Informed Eulas Post, RN in treatment area.

## 2014-12-05 NOTE — Progress Notes (Signed)
Treatment held today for ANC 1.0 per Amy H.  Pt brought back to infusion room to be deaccessed.  Spoke with patient about neutropenic precautions and pt and wife confirmed understanding.  Wife states she has masks at home and does not need to have any sent home with them today.  No further questions prior to discharge from infusion room.

## 2014-12-06 LAB — CANCER ANTIGEN 19-9: CA 19-9: 73.4 U/mL — ABNORMAL HIGH (ref <35.0)

## 2014-12-09 ENCOUNTER — Other Ambulatory Visit: Payer: Self-pay | Admitting: Oncology

## 2014-12-12 ENCOUNTER — Ambulatory Visit: Payer: Medicare Other

## 2014-12-12 ENCOUNTER — Other Ambulatory Visit (HOSPITAL_BASED_OUTPATIENT_CLINIC_OR_DEPARTMENT_OTHER): Payer: Medicare Other

## 2014-12-12 ENCOUNTER — Other Ambulatory Visit: Payer: Self-pay | Admitting: Oncology

## 2014-12-12 ENCOUNTER — Ambulatory Visit (HOSPITAL_BASED_OUTPATIENT_CLINIC_OR_DEPARTMENT_OTHER): Payer: Medicare Other

## 2014-12-12 VITALS — BP 112/48 | HR 58 | Temp 97.1°F | Resp 20

## 2014-12-12 DIAGNOSIS — Z95828 Presence of other vascular implants and grafts: Secondary | ICD-10-CM

## 2014-12-12 DIAGNOSIS — C23 Malignant neoplasm of gallbladder: Secondary | ICD-10-CM

## 2014-12-12 DIAGNOSIS — C787 Secondary malignant neoplasm of liver and intrahepatic bile duct: Secondary | ICD-10-CM | POA: Diagnosis not present

## 2014-12-12 DIAGNOSIS — Z5111 Encounter for antineoplastic chemotherapy: Secondary | ICD-10-CM | POA: Diagnosis present

## 2014-12-12 LAB — CBC WITH DIFFERENTIAL/PLATELET
BASO%: 0.7 % (ref 0.0–2.0)
Basophils Absolute: 0 10*3/uL (ref 0.0–0.1)
EOS%: 1.3 % (ref 0.0–7.0)
Eosinophils Absolute: 0.1 10*3/uL (ref 0.0–0.5)
HEMATOCRIT: 32.7 % — AB (ref 38.4–49.9)
HGB: 10.9 g/dL — ABNORMAL LOW (ref 13.0–17.1)
LYMPH#: 0.7 10*3/uL — AB (ref 0.9–3.3)
LYMPH%: 15.4 % (ref 14.0–49.0)
MCH: 32.6 pg (ref 27.2–33.4)
MCHC: 33.3 g/dL (ref 32.0–36.0)
MCV: 97.9 fL (ref 79.3–98.0)
MONO#: 0.6 10*3/uL (ref 0.1–0.9)
MONO%: 12.6 % (ref 0.0–14.0)
NEUT%: 70 % (ref 39.0–75.0)
NEUTROS ABS: 3.2 10*3/uL (ref 1.5–6.5)
PLATELETS: 189 10*3/uL (ref 140–400)
RBC: 3.35 10*6/uL — ABNORMAL LOW (ref 4.20–5.82)
RDW: 18.6 % — ABNORMAL HIGH (ref 11.0–14.6)
WBC: 4.5 10*3/uL (ref 4.0–10.3)

## 2014-12-12 LAB — COMPREHENSIVE METABOLIC PANEL (CC13)
ALBUMIN: 3.2 g/dL — AB (ref 3.5–5.0)
ALT: 52 U/L (ref 0–55)
AST: 47 U/L — AB (ref 5–34)
Alkaline Phosphatase: 250 U/L — ABNORMAL HIGH (ref 40–150)
Anion Gap: 6 mEq/L (ref 3–11)
BUN: 15.1 mg/dL (ref 7.0–26.0)
CALCIUM: 8.8 mg/dL (ref 8.4–10.4)
CHLORIDE: 106 meq/L (ref 98–109)
CO2: 27 meq/L (ref 22–29)
CREATININE: 0.8 mg/dL (ref 0.7–1.3)
EGFR: 87 mL/min/{1.73_m2} — AB (ref >90)
Glucose: 94 mg/dl (ref 70–140)
Potassium: 4 mEq/L (ref 3.5–5.1)
Sodium: 139 mEq/L (ref 136–145)
Total Bilirubin: 0.32 mg/dL (ref 0.20–1.20)
Total Protein: 6.1 g/dL — ABNORMAL LOW (ref 6.4–8.3)

## 2014-12-12 MED ORDER — HEPARIN SOD (PORK) LOCK FLUSH 100 UNIT/ML IV SOLN
500.0000 [IU] | Freq: Once | INTRAVENOUS | Status: AC | PRN
  Administered 2014-12-12: 500 [IU]
  Filled 2014-12-12: qty 5

## 2014-12-12 MED ORDER — SODIUM CHLORIDE 0.9 % IJ SOLN
10.0000 mL | INTRAMUSCULAR | Status: DC | PRN
  Administered 2014-12-12: 10 mL via INTRAVENOUS
  Filled 2014-12-12: qty 10

## 2014-12-12 MED ORDER — SODIUM CHLORIDE 0.9 % IJ SOLN
10.0000 mL | INTRAMUSCULAR | Status: DC | PRN
  Administered 2014-12-12: 10 mL
  Filled 2014-12-12: qty 10

## 2014-12-12 MED ORDER — PROCHLORPERAZINE MALEATE 10 MG PO TABS
10.0000 mg | ORAL_TABLET | Freq: Once | ORAL | Status: AC
  Administered 2014-12-12: 10 mg via ORAL

## 2014-12-12 MED ORDER — SODIUM CHLORIDE 0.9 % IV SOLN
Freq: Once | INTRAVENOUS | Status: AC
  Administered 2014-12-12: 12:00:00 via INTRAVENOUS

## 2014-12-12 MED ORDER — PROCHLORPERAZINE MALEATE 10 MG PO TABS
ORAL_TABLET | ORAL | Status: AC
  Filled 2014-12-12: qty 1

## 2014-12-12 MED ORDER — SODIUM CHLORIDE 0.9 % IV SOLN
640.0000 mg/m2 | Freq: Once | INTRAVENOUS | Status: AC
  Administered 2014-12-12: 1292 mg via INTRAVENOUS
  Filled 2014-12-12: qty 33.98

## 2014-12-12 NOTE — Patient Instructions (Signed)

## 2014-12-26 ENCOUNTER — Other Ambulatory Visit (HOSPITAL_BASED_OUTPATIENT_CLINIC_OR_DEPARTMENT_OTHER): Payer: Medicare Other

## 2014-12-26 ENCOUNTER — Telehealth: Payer: Self-pay | Admitting: Nurse Practitioner

## 2014-12-26 ENCOUNTER — Other Ambulatory Visit: Payer: Self-pay | Admitting: Oncology

## 2014-12-26 ENCOUNTER — Ambulatory Visit: Payer: Medicare Other

## 2014-12-26 ENCOUNTER — Telehealth: Payer: Self-pay | Admitting: *Deleted

## 2014-12-26 ENCOUNTER — Ambulatory Visit (HOSPITAL_BASED_OUTPATIENT_CLINIC_OR_DEPARTMENT_OTHER): Payer: Medicare Other

## 2014-12-26 ENCOUNTER — Ambulatory Visit (HOSPITAL_BASED_OUTPATIENT_CLINIC_OR_DEPARTMENT_OTHER): Payer: Medicare Other | Admitting: Nurse Practitioner

## 2014-12-26 VITALS — BP 116/61 | HR 63 | Temp 97.8°F | Resp 18 | Ht 73.0 in | Wt 176.9 lb

## 2014-12-26 DIAGNOSIS — C23 Malignant neoplasm of gallbladder: Secondary | ICD-10-CM

## 2014-12-26 DIAGNOSIS — Z5111 Encounter for antineoplastic chemotherapy: Secondary | ICD-10-CM | POA: Diagnosis present

## 2014-12-26 DIAGNOSIS — Z95828 Presence of other vascular implants and grafts: Secondary | ICD-10-CM

## 2014-12-26 LAB — CBC WITH DIFFERENTIAL/PLATELET
BASO%: 0.8 % (ref 0.0–2.0)
BASOS ABS: 0 10*3/uL (ref 0.0–0.1)
EOS ABS: 0.1 10*3/uL (ref 0.0–0.5)
EOS%: 1.5 % (ref 0.0–7.0)
HCT: 32.9 % — ABNORMAL LOW (ref 38.4–49.9)
HGB: 11.1 g/dL — ABNORMAL LOW (ref 13.0–17.1)
LYMPH%: 13.2 % — ABNORMAL LOW (ref 14.0–49.0)
MCH: 33.3 pg (ref 27.2–33.4)
MCHC: 33.9 g/dL (ref 32.0–36.0)
MCV: 98.3 fL — AB (ref 79.3–98.0)
MONO#: 0.8 10*3/uL (ref 0.1–0.9)
MONO%: 14.2 % — AB (ref 0.0–14.0)
NEUT#: 3.9 10*3/uL (ref 1.5–6.5)
NEUT%: 70.3 % (ref 39.0–75.0)
PLATELETS: 156 10*3/uL (ref 140–400)
RBC: 3.35 10*6/uL — ABNORMAL LOW (ref 4.20–5.82)
RDW: 17.1 % — ABNORMAL HIGH (ref 11.0–14.6)
WBC: 5.5 10*3/uL (ref 4.0–10.3)
lymph#: 0.7 10*3/uL — ABNORMAL LOW (ref 0.9–3.3)

## 2014-12-26 LAB — COMPREHENSIVE METABOLIC PANEL (CC13)
ALT: 63 U/L — AB (ref 0–55)
AST: 47 U/L — AB (ref 5–34)
Albumin: 3.3 g/dL — ABNORMAL LOW (ref 3.5–5.0)
Alkaline Phosphatase: 399 U/L — ABNORMAL HIGH (ref 40–150)
Anion Gap: 6 mEq/L (ref 3–11)
BUN: 14.1 mg/dL (ref 7.0–26.0)
CHLORIDE: 108 meq/L (ref 98–109)
CO2: 26 meq/L (ref 22–29)
Calcium: 8.9 mg/dL (ref 8.4–10.4)
Creatinine: 0.8 mg/dL (ref 0.7–1.3)
EGFR: 87 mL/min/{1.73_m2} — ABNORMAL LOW (ref >90)
GLUCOSE: 112 mg/dL (ref 70–140)
Potassium: 4.2 mEq/L (ref 3.5–5.1)
SODIUM: 140 meq/L (ref 136–145)
Total Bilirubin: 0.33 mg/dL (ref 0.20–1.20)
Total Protein: 6.5 g/dL (ref 6.4–8.3)

## 2014-12-26 MED ORDER — SODIUM CHLORIDE 0.9 % IV SOLN
Freq: Once | INTRAVENOUS | Status: AC
  Administered 2014-12-26: 13:00:00 via INTRAVENOUS

## 2014-12-26 MED ORDER — HEPARIN SOD (PORK) LOCK FLUSH 100 UNIT/ML IV SOLN
500.0000 [IU] | Freq: Once | INTRAVENOUS | Status: AC | PRN
  Administered 2014-12-26: 500 [IU]
  Filled 2014-12-26: qty 5

## 2014-12-26 MED ORDER — SODIUM CHLORIDE 0.9 % IJ SOLN
10.0000 mL | INTRAMUSCULAR | Status: DC | PRN
  Administered 2014-12-26: 10 mL via INTRAVENOUS
  Filled 2014-12-26: qty 10

## 2014-12-26 MED ORDER — SODIUM CHLORIDE 0.9 % IV SOLN
640.0000 mg/m2 | Freq: Once | INTRAVENOUS | Status: AC
  Administered 2014-12-26: 1292 mg via INTRAVENOUS
  Filled 2014-12-26: qty 34

## 2014-12-26 MED ORDER — SODIUM CHLORIDE 0.9 % IJ SOLN
10.0000 mL | INTRAMUSCULAR | Status: DC | PRN
  Administered 2014-12-26: 10 mL
  Filled 2014-12-26: qty 10

## 2014-12-26 MED ORDER — PROCHLORPERAZINE MALEATE 10 MG PO TABS
10.0000 mg | ORAL_TABLET | Freq: Once | ORAL | Status: AC
Start: 2014-12-26 — End: 2014-12-26
  Administered 2014-12-26: 10 mg via ORAL

## 2014-12-26 MED ORDER — PROCHLORPERAZINE MALEATE 10 MG PO TABS
ORAL_TABLET | ORAL | Status: AC
  Filled 2014-12-26: qty 1

## 2014-12-26 NOTE — Telephone Encounter (Signed)
Pt confirmed labs/ov per 07/21 POF, gave pt AVS and Calendar.... KJ, sent msg to add chemo °

## 2014-12-26 NOTE — Telephone Encounter (Signed)
Per staff message and POF I have scheduled appts. Advised scheduler of appts. JMW  

## 2014-12-26 NOTE — Patient Instructions (Signed)

## 2014-12-26 NOTE — Patient Instructions (Signed)
[facility] Discharge Instructions for Patients Receiving Chemotherapy  Today you received the following chemotherapy agents: Gemzar  To help prevent nausea and vomiting after your treatment, we encourage you to take your nausea medication as prescribed by your physician.    If you develop nausea and vomiting that is not controlled by your nausea medication, call the clinic.   BELOW ARE SYMPTOMS THAT SHOULD BE REPORTED IMMEDIATELY:  *FEVER GREATER THAN 100.5 F  *CHILLS WITH OR WITHOUT FEVER  NAUSEA AND VOMITING THAT IS NOT CONTROLLED WITH YOUR NAUSEA MEDICATION  *UNUSUAL SHORTNESS OF BREATH  *UNUSUAL BRUISING OR BLEEDING  TENDERNESS IN MOUTH AND THROAT WITH OR WITHOUT PRESENCE OF ULCERS  *URINARY PROBLEMS  *BOWEL PROBLEMS  UNUSUAL RASH Items with * indicate a potential emergency and should be followed up as soon as possible.  Feel free to call the clinic you have any questions or concerns. The clinic phone number is [phone].  Please show the CHEMO ALERT CARD at check-in to the Emergency Department and triage nurse.   

## 2014-12-26 NOTE — Progress Notes (Signed)
  [place] OFFICE PROGRESS NOTE   Diagnosis:  Pancreas cancer  INTERVAL HISTORY:   Ronnie Nicholson returns as scheduled. He was treated with gemcitabine on 11/28/2014. Gemcitabine was held on 12/05/2014 due to a neutrophil count of 1.0. He received gemcitabine on 12/12/2014.  He denies nausea/vomiting. No mouth sores. No diarrhea. No rash. No fever. He denies shortness of breath and cough. He has a good appetite. He feels his fluid intake is adequate. He occasionally notes lightheadedness and dizziness with position change. No associated symptoms.  Objective:  Vital signs in last 24 hours:  Blood pressure 116/61, pulse 63, temperature 97.8 F (36.6 C), temperature source Oral, resp. rate 18, height _0  (1.854 m), weight 176 lb 14.4 oz (80.241 kg), SpO2 100 %.    HEENT: No thrush or ulcers. Resp: Lungs clear bilaterally. Cardio: Regular rate and rhythm. GI: Abdomen soft and nontender. No hepatomegaly. Vascular: No leg edema. Skin: No rash. Port-A-Cath without erythema.    Lab Results:  Lab Results  Component Value Date   WBC 5.5 12/26/2014   HGB 11.1* 12/26/2014   HCT 32.9* 12/26/2014   MCV 98.3* 12/26/2014   PLT 156 12/26/2014   NEUTROABS 3.9 12/26/2014    Imaging:  No results found.  Medications: I have reviewed the patient's current medications.  Assessment/Plan: 1. 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, M0), status post a cholecystectomy 06/24/2014 with a 2.5 cm gallbladder mucosal mass with extension into attached hepatic parenchyma, positive hepatic parenchymal and cystic duct margins, diffuse lymphovascular invasion  Elevated CA 19-9  Partial liver resection, bile duct resection, lymph node resection on 08/01/2014 with the pathology confirming residual invasive adenocarcinoma in the liver-negative margins, metastatic adenocarcinoma with extranodal extension involving one lymph node  Final pathologic stage  IIIB (pT3,pN1)  Initiation of adjuvant gemcitabine 09/05/2014 (09/05/2014, 09/12/2014, 09/19/2014)  Initiation of adjuvant radiation/Xeloda 10/07/2014; completed 11/11/2014  Resumption of adjuvant gemcitabine 11/28/2014 (11/28/2014, 12/12/2014; held on 12/05/2014 due to neutropenia)  2. Intermittent right upper abdominal pain beginning in the summer of 2015-likely related to symptomatic cholelithiasis. Improved.  3. History of colon polyps  4. Family history of multiple cancers including colon cancer, status post a genetics evaluation. Positive for the BRCA1 mutation.  5. Elevated liver enzymes 09/07/2014; improved    Disposition: [name] appears stable. Plan to proceed with the next cycle of gemcitabine today as scheduled (weekly 3 followed by a one-week break). The plan is for a total of 12 treatments with gemcitabine.  We obtained orthostatic vital signs due to his complaint of occasional lightheadedness/dizziness with position change. He is not orthostatic. We discussed slow position change. He is currently on metoprolol 25 mg twice daily. He will follow-up with his cardiologist if symptoms persist.  [name] will return for a follow-up visit on 01/23/2015. He will contact the office in the interim with any problems.  Plan reviewed with Dr. Benay Spice.    Ned Card ANP/GNP-BC   12/26/2014  12:07 PM

## 2014-12-28 ENCOUNTER — Other Ambulatory Visit: Payer: Self-pay | Admitting: Oncology

## 2014-12-30 ENCOUNTER — Telehealth: Payer: Self-pay | Admitting: Oncology

## 2014-12-30 NOTE — Telephone Encounter (Signed)
S/w pt's wife confirming chemo was added and they will p/u schedule 07/28 next visit.... KJ

## 2015-01-02 ENCOUNTER — Ambulatory Visit (HOSPITAL_BASED_OUTPATIENT_CLINIC_OR_DEPARTMENT_OTHER): Payer: Medicare Other

## 2015-01-02 ENCOUNTER — Ambulatory Visit: Payer: Medicare Other

## 2015-01-02 ENCOUNTER — Other Ambulatory Visit (HOSPITAL_BASED_OUTPATIENT_CLINIC_OR_DEPARTMENT_OTHER): Payer: Medicare Other

## 2015-01-02 VITALS — BP 117/62 | HR 66 | Temp 98.4°F | Resp 16

## 2015-01-02 DIAGNOSIS — Z95828 Presence of other vascular implants and grafts: Secondary | ICD-10-CM

## 2015-01-02 DIAGNOSIS — C23 Malignant neoplasm of gallbladder: Secondary | ICD-10-CM | POA: Diagnosis present

## 2015-01-02 DIAGNOSIS — C787 Secondary malignant neoplasm of liver and intrahepatic bile duct: Secondary | ICD-10-CM | POA: Diagnosis not present

## 2015-01-02 DIAGNOSIS — Z5111 Encounter for antineoplastic chemotherapy: Secondary | ICD-10-CM

## 2015-01-02 LAB — COMPREHENSIVE METABOLIC PANEL (CC13)
ALBUMIN: 3.2 g/dL — AB (ref 3.5–5.0)
ALK PHOS: 522 U/L — AB (ref 40–150)
ALT: 98 U/L — ABNORMAL HIGH (ref 0–55)
AST: 82 U/L — ABNORMAL HIGH (ref 5–34)
Anion Gap: 7 mEq/L (ref 3–11)
BILIRUBIN TOTAL: 0.42 mg/dL (ref 0.20–1.20)
BUN: 16.3 mg/dL (ref 7.0–26.0)
CALCIUM: 8.8 mg/dL (ref 8.4–10.4)
CO2: 27 mEq/L (ref 22–29)
CREATININE: 0.8 mg/dL (ref 0.7–1.3)
Chloride: 105 mEq/L (ref 98–109)
EGFR: 86 mL/min/{1.73_m2} — ABNORMAL LOW (ref >90)
GLUCOSE: 108 mg/dL (ref 70–140)
POTASSIUM: 4.2 meq/L (ref 3.5–5.1)
SODIUM: 138 meq/L (ref 136–145)
TOTAL PROTEIN: 6.8 g/dL (ref 6.4–8.3)

## 2015-01-02 LAB — CBC WITH DIFFERENTIAL/PLATELET
BASO%: 1.4 % (ref 0.0–2.0)
Basophils Absolute: 0 10*3/uL (ref 0.0–0.1)
EOS%: 1.2 % (ref 0.0–7.0)
Eosinophils Absolute: 0 10*3/uL (ref 0.0–0.5)
HEMATOCRIT: 32.2 % — AB (ref 38.4–49.9)
HEMOGLOBIN: 10.8 g/dL — AB (ref 13.0–17.1)
LYMPH%: 19.8 % (ref 14.0–49.0)
MCH: 33.2 pg (ref 27.2–33.4)
MCHC: 33.7 g/dL (ref 32.0–36.0)
MCV: 98.7 fL — AB (ref 79.3–98.0)
MONO#: 0.5 10*3/uL (ref 0.1–0.9)
MONO%: 15 % — AB (ref 0.0–14.0)
NEUT#: 1.9 10*3/uL (ref 1.5–6.5)
NEUT%: 62.6 % (ref 39.0–75.0)
Platelets: 194 10*3/uL (ref 140–400)
RBC: 3.26 10*6/uL — ABNORMAL LOW (ref 4.20–5.82)
RDW: 15.6 % — AB (ref 11.0–14.6)
WBC: 3.1 10*3/uL — ABNORMAL LOW (ref 4.0–10.3)
lymph#: 0.6 10*3/uL — ABNORMAL LOW (ref 0.9–3.3)

## 2015-01-02 MED ORDER — SODIUM CHLORIDE 0.9 % IJ SOLN
10.0000 mL | INTRAMUSCULAR | Status: DC | PRN
  Administered 2015-01-02: 10 mL via INTRAVENOUS
  Filled 2015-01-02: qty 10

## 2015-01-02 MED ORDER — GEMCITABINE HCL CHEMO INJECTION 1 GM/26.3ML
640.0000 mg/m2 | Freq: Once | INTRAVENOUS | Status: AC
  Administered 2015-01-02: 1292 mg via INTRAVENOUS
  Filled 2015-01-02: qty 33.98

## 2015-01-02 MED ORDER — PROCHLORPERAZINE MALEATE 10 MG PO TABS
10.0000 mg | ORAL_TABLET | Freq: Once | ORAL | Status: AC
  Administered 2015-01-02: 10 mg via ORAL

## 2015-01-02 MED ORDER — SODIUM CHLORIDE 0.9 % IJ SOLN
10.0000 mL | INTRAMUSCULAR | Status: DC | PRN
  Administered 2015-01-02: 10 mL
  Filled 2015-01-02: qty 10

## 2015-01-02 MED ORDER — PROCHLORPERAZINE MALEATE 10 MG PO TABS
ORAL_TABLET | ORAL | Status: AC
  Filled 2015-01-02: qty 1

## 2015-01-02 MED ORDER — HEPARIN SOD (PORK) LOCK FLUSH 100 UNIT/ML IV SOLN
500.0000 [IU] | Freq: Once | INTRAVENOUS | Status: AC | PRN
  Administered 2015-01-02: 500 [IU]
  Filled 2015-01-02: qty 5

## 2015-01-02 MED ORDER — SODIUM CHLORIDE 0.9 % IV SOLN
Freq: Once | INTRAVENOUS | Status: AC
  Administered 2015-01-02: 12:00:00 via INTRAVENOUS

## 2015-01-02 NOTE — Patient Instructions (Signed)
[facility] Discharge Instructions for Patients Receiving Chemotherapy  Today you received the following chemotherapy agents Gemcitabine.   To help prevent nausea and vomiting after your treatment, we encourage you to take your nausea medication as directed.    If you develop nausea and vomiting that is not controlled by your nausea medication, call the clinic.   BELOW ARE SYMPTOMS THAT SHOULD BE REPORTED IMMEDIATELY:  *FEVER GREATER THAN 100.5 F  *CHILLS WITH OR WITHOUT FEVER  NAUSEA AND VOMITING THAT IS NOT CONTROLLED WITH YOUR NAUSEA MEDICATION  *UNUSUAL SHORTNESS OF BREATH  *UNUSUAL BRUISING OR BLEEDING  TENDERNESS IN MOUTH AND THROAT WITH OR WITHOUT PRESENCE OF ULCERS  *URINARY PROBLEMS  *BOWEL PROBLEMS  UNUSUAL RASH Items with * indicate a potential emergency and should be followed up as soon as possible.  Feel free to call the clinic you have any questions or concerns. The clinic phone number is [phone].  Please show the CHEMO ALERT CARD at check-in to the Emergency Department and triage nurse.   

## 2015-01-02 NOTE — Progress Notes (Signed)
   Per Dr. Benay Spice ok to treat despite AST, ALT, and Alk Phos level.

## 2015-01-02 NOTE — Patient Instructions (Signed)

## 2015-01-05 ENCOUNTER — Other Ambulatory Visit: Payer: Self-pay | Admitting: Oncology

## 2015-01-09 ENCOUNTER — Other Ambulatory Visit (HOSPITAL_BASED_OUTPATIENT_CLINIC_OR_DEPARTMENT_OTHER): Payer: Medicare Other

## 2015-01-09 ENCOUNTER — Ambulatory Visit (HOSPITAL_BASED_OUTPATIENT_CLINIC_OR_DEPARTMENT_OTHER): Payer: Medicare Other

## 2015-01-09 ENCOUNTER — Telehealth: Payer: Self-pay | Admitting: Oncology

## 2015-01-09 VITALS — BP 130/63 | HR 59 | Temp 98.2°F | Resp 17

## 2015-01-09 DIAGNOSIS — C787 Secondary malignant neoplasm of liver and intrahepatic bile duct: Secondary | ICD-10-CM

## 2015-01-09 DIAGNOSIS — Z95828 Presence of other vascular implants and grafts: Secondary | ICD-10-CM

## 2015-01-09 DIAGNOSIS — R799 Abnormal finding of blood chemistry, unspecified: Secondary | ICD-10-CM | POA: Diagnosis not present

## 2015-01-09 DIAGNOSIS — C23 Malignant neoplasm of gallbladder: Secondary | ICD-10-CM

## 2015-01-09 LAB — CBC WITH DIFFERENTIAL/PLATELET
BASO%: 0.4 % (ref 0.0–2.0)
Basophils Absolute: 0 10*3/uL (ref 0.0–0.1)
EOS ABS: 0 10*3/uL (ref 0.0–0.5)
EOS%: 1.6 % (ref 0.0–7.0)
HEMATOCRIT: 30.3 % — AB (ref 38.4–49.9)
HGB: 10.1 g/dL — ABNORMAL LOW (ref 13.0–17.1)
LYMPH%: 24.9 % (ref 14.0–49.0)
MCH: 32.9 pg (ref 27.2–33.4)
MCHC: 33.3 g/dL (ref 32.0–36.0)
MCV: 98.7 fL — ABNORMAL HIGH (ref 79.3–98.0)
MONO#: 0.4 10*3/uL (ref 0.1–0.9)
MONO%: 15.6 % — ABNORMAL HIGH (ref 0.0–14.0)
NEUT%: 57.5 % (ref 39.0–75.0)
NEUTROS ABS: 1.5 10*3/uL (ref 1.5–6.5)
PLATELETS: 90 10*3/uL — AB (ref 140–400)
RBC: 3.07 10*6/uL — AB (ref 4.20–5.82)
RDW: 14.2 % (ref 11.0–14.6)
WBC: 2.6 10*3/uL — ABNORMAL LOW (ref 4.0–10.3)
lymph#: 0.6 10*3/uL — ABNORMAL LOW (ref 0.9–3.3)

## 2015-01-09 LAB — COMPREHENSIVE METABOLIC PANEL (CC13)
ALT: 111 U/L — ABNORMAL HIGH (ref 0–55)
ANION GAP: 7 meq/L (ref 3–11)
AST: 89 U/L — ABNORMAL HIGH (ref 5–34)
Albumin: 3.3 g/dL — ABNORMAL LOW (ref 3.5–5.0)
Alkaline Phosphatase: 510 U/L — ABNORMAL HIGH (ref 40–150)
BUN: 12.9 mg/dL (ref 7.0–26.0)
CHLORIDE: 106 meq/L (ref 98–109)
CO2: 27 mEq/L (ref 22–29)
Calcium: 8.9 mg/dL (ref 8.4–10.4)
Creatinine: 0.8 mg/dL (ref 0.7–1.3)
EGFR: 87 mL/min/{1.73_m2} — AB (ref >90)
Glucose: 104 mg/dl (ref 70–140)
Potassium: 4.2 mEq/L (ref 3.5–5.1)
SODIUM: 139 meq/L (ref 136–145)
TOTAL PROTEIN: 6.6 g/dL (ref 6.4–8.3)
Total Bilirubin: 0.34 mg/dL (ref 0.20–1.20)

## 2015-01-09 MED ORDER — HEPARIN SOD (PORK) LOCK FLUSH 100 UNIT/ML IV SOLN
500.0000 [IU] | Freq: Once | INTRAVENOUS | Status: AC | PRN
  Administered 2015-01-09: 500 [IU]
  Filled 2015-01-09: qty 5

## 2015-01-09 MED ORDER — SODIUM CHLORIDE 0.9 % IJ SOLN
10.0000 mL | INTRAMUSCULAR | Status: DC | PRN
  Administered 2015-01-09: 10 mL via INTRAVENOUS
  Filled 2015-01-09: qty 10

## 2015-01-09 MED ORDER — SODIUM CHLORIDE 0.9 % IV SOLN
Freq: Once | INTRAVENOUS | Status: AC
  Administered 2015-01-09: 12:00:00 via INTRAVENOUS

## 2015-01-09 MED ORDER — SODIUM CHLORIDE 0.9 % IJ SOLN
10.0000 mL | INTRAMUSCULAR | Status: DC | PRN
  Administered 2015-01-09: 10 mL
  Filled 2015-01-09: qty 10

## 2015-01-09 NOTE — Progress Notes (Signed)
Due to abnormal labs, Dr. Benay Spice postponing treatment 1 week.  Informed pt and wife of change in treatment plan. They voiced understanding and will stop by scheduling on their way out today.

## 2015-01-09 NOTE — Telephone Encounter (Signed)
Added appt per pof...the patient will get sched in chemo °

## 2015-01-09 NOTE — Patient Instructions (Signed)

## 2015-01-12 ENCOUNTER — Other Ambulatory Visit: Payer: Self-pay | Admitting: Oncology

## 2015-01-15 ENCOUNTER — Other Ambulatory Visit: Payer: Self-pay | Admitting: *Deleted

## 2015-01-15 DIAGNOSIS — C23 Malignant neoplasm of gallbladder: Secondary | ICD-10-CM

## 2015-01-16 ENCOUNTER — Other Ambulatory Visit (HOSPITAL_BASED_OUTPATIENT_CLINIC_OR_DEPARTMENT_OTHER): Payer: Medicare Other

## 2015-01-16 ENCOUNTER — Ambulatory Visit (HOSPITAL_BASED_OUTPATIENT_CLINIC_OR_DEPARTMENT_OTHER): Payer: Medicare Other

## 2015-01-16 VITALS — BP 130/60 | HR 79 | Temp 97.6°F | Resp 16

## 2015-01-16 DIAGNOSIS — C23 Malignant neoplasm of gallbladder: Secondary | ICD-10-CM | POA: Diagnosis present

## 2015-01-16 DIAGNOSIS — C787 Secondary malignant neoplasm of liver and intrahepatic bile duct: Secondary | ICD-10-CM

## 2015-01-16 DIAGNOSIS — Z5111 Encounter for antineoplastic chemotherapy: Secondary | ICD-10-CM

## 2015-01-16 LAB — COMPREHENSIVE METABOLIC PANEL (CC13)
ALT: 75 U/L — ABNORMAL HIGH (ref 0–55)
AST: 56 U/L — ABNORMAL HIGH (ref 5–34)
Albumin: 3.3 g/dL — ABNORMAL LOW (ref 3.5–5.0)
Alkaline Phosphatase: 573 U/L — ABNORMAL HIGH (ref 40–150)
Anion Gap: 5 mEq/L (ref 3–11)
BUN: 10.4 mg/dL (ref 7.0–26.0)
CALCIUM: 9.2 mg/dL (ref 8.4–10.4)
CHLORIDE: 106 meq/L (ref 98–109)
CO2: 30 mEq/L — ABNORMAL HIGH (ref 22–29)
Creatinine: 0.9 mg/dL (ref 0.7–1.3)
EGFR: 84 mL/min/{1.73_m2} — AB (ref >90)
GLUCOSE: 112 mg/dL (ref 70–140)
POTASSIUM: 4 meq/L (ref 3.5–5.1)
Sodium: 141 mEq/L (ref 136–145)
Total Bilirubin: 0.49 mg/dL (ref 0.20–1.20)
Total Protein: 6.7 g/dL (ref 6.4–8.3)

## 2015-01-16 LAB — CBC WITH DIFFERENTIAL/PLATELET
BASO%: 0.4 % (ref 0.0–2.0)
Basophils Absolute: 0 10*3/uL (ref 0.0–0.1)
EOS%: 2.3 % (ref 0.0–7.0)
Eosinophils Absolute: 0.1 10*3/uL (ref 0.0–0.5)
HEMATOCRIT: 34.4 % — AB (ref 38.4–49.9)
HGB: 11.4 g/dL — ABNORMAL LOW (ref 13.0–17.1)
LYMPH%: 11.3 % — AB (ref 14.0–49.0)
MCH: 32.9 pg (ref 27.2–33.4)
MCHC: 33.1 g/dL (ref 32.0–36.0)
MCV: 99.4 fL — AB (ref 79.3–98.0)
MONO#: 0.6 10*3/uL (ref 0.1–0.9)
MONO%: 12.6 % (ref 0.0–14.0)
NEUT#: 3.5 10*3/uL (ref 1.5–6.5)
NEUT%: 73.4 % (ref 39.0–75.0)
PLATELETS: 236 10*3/uL (ref 140–400)
RBC: 3.46 10*6/uL — AB (ref 4.20–5.82)
RDW: 14.6 % (ref 11.0–14.6)
WBC: 4.7 10*3/uL (ref 4.0–10.3)
lymph#: 0.5 10*3/uL — ABNORMAL LOW (ref 0.9–3.3)

## 2015-01-16 MED ORDER — HEPARIN SOD (PORK) LOCK FLUSH 100 UNIT/ML IV SOLN
500.0000 [IU] | Freq: Once | INTRAVENOUS | Status: AC | PRN
  Administered 2015-01-16: 500 [IU]
  Filled 2015-01-16: qty 5

## 2015-01-16 MED ORDER — PROCHLORPERAZINE MALEATE 10 MG PO TABS
ORAL_TABLET | ORAL | Status: AC
Start: 2015-01-16 — End: 2015-01-16
  Filled 2015-01-16: qty 1

## 2015-01-16 MED ORDER — PROCHLORPERAZINE MALEATE 10 MG PO TABS
10.0000 mg | ORAL_TABLET | Freq: Once | ORAL | Status: AC
  Administered 2015-01-16: 10 mg via ORAL

## 2015-01-16 MED ORDER — SODIUM CHLORIDE 0.9 % IV SOLN
Freq: Once | INTRAVENOUS | Status: AC
  Administered 2015-01-16: 10:00:00 via INTRAVENOUS

## 2015-01-16 MED ORDER — SODIUM CHLORIDE 0.9 % IV SOLN
640.0000 mg/m2 | Freq: Once | INTRAVENOUS | Status: AC
  Administered 2015-01-16: 1292 mg via INTRAVENOUS
  Filled 2015-01-16: qty 33.98

## 2015-01-16 MED ORDER — SODIUM CHLORIDE 0.9 % IJ SOLN
10.0000 mL | INTRAMUSCULAR | Status: DC | PRN
  Administered 2015-01-16: 10 mL
  Filled 2015-01-16: qty 10

## 2015-01-16 NOTE — Patient Instructions (Signed)
[facility] Discharge Instructions for Patients Receiving Chemotherapy  Today you received the following chemotherapy agents gemzar.  To help prevent nausea and vomiting after your treatment, we encourage you to take your nausea medication.   If you develop nausea and vomiting that is not controlled by your nausea medication, call the clinic.   BELOW ARE SYMPTOMS THAT SHOULD BE REPORTED IMMEDIATELY:  *FEVER GREATER THAN 100.5 F  *CHILLS WITH OR WITHOUT FEVER  NAUSEA AND VOMITING THAT IS NOT CONTROLLED WITH YOUR NAUSEA MEDICATION  *UNUSUAL SHORTNESS OF BREATH  *UNUSUAL BRUISING OR BLEEDING  TENDERNESS IN MOUTH AND THROAT WITH OR WITHOUT PRESENCE OF ULCERS  *URINARY PROBLEMS  *BOWEL PROBLEMS  UNUSUAL RASH Items with * indicate a potential emergency and should be followed up as soon as possible.  Feel free to call the clinic you have any questions or concerns. The clinic phone number is [phone].  Please show the CHEMO ALERT CARD at check-in to the Emergency Department and triage nurse.   

## 2015-01-16 NOTE — Progress Notes (Signed)
Per Dr. Benay Spice- ok to treat despite elevated Alk Phos.

## 2015-01-23 ENCOUNTER — Telehealth: Payer: Self-pay | Admitting: Oncology

## 2015-01-23 ENCOUNTER — Ambulatory Visit (HOSPITAL_BASED_OUTPATIENT_CLINIC_OR_DEPARTMENT_OTHER): Payer: Medicare Other | Admitting: Oncology

## 2015-01-23 ENCOUNTER — Ambulatory Visit (HOSPITAL_BASED_OUTPATIENT_CLINIC_OR_DEPARTMENT_OTHER): Payer: Medicare Other

## 2015-01-23 ENCOUNTER — Ambulatory Visit: Payer: Medicare Other

## 2015-01-23 ENCOUNTER — Other Ambulatory Visit (HOSPITAL_BASED_OUTPATIENT_CLINIC_OR_DEPARTMENT_OTHER): Payer: Medicare Other

## 2015-01-23 VITALS — BP 123/57 | HR 67 | Temp 97.9°F | Resp 18 | Ht 73.0 in | Wt 177.9 lb

## 2015-01-23 DIAGNOSIS — C23 Malignant neoplasm of gallbladder: Secondary | ICD-10-CM

## 2015-01-23 DIAGNOSIS — D709 Neutropenia, unspecified: Secondary | ICD-10-CM

## 2015-01-23 DIAGNOSIS — Z5111 Encounter for antineoplastic chemotherapy: Secondary | ICD-10-CM

## 2015-01-23 DIAGNOSIS — C787 Secondary malignant neoplasm of liver and intrahepatic bile duct: Secondary | ICD-10-CM

## 2015-01-23 DIAGNOSIS — Z95828 Presence of other vascular implants and grafts: Secondary | ICD-10-CM

## 2015-01-23 LAB — COMPREHENSIVE METABOLIC PANEL (CC13)
ALT: 51 U/L (ref 0–55)
ANION GAP: 9 meq/L (ref 3–11)
AST: 44 U/L — ABNORMAL HIGH (ref 5–34)
Albumin: 3.2 g/dL — ABNORMAL LOW (ref 3.5–5.0)
Alkaline Phosphatase: 505 U/L — ABNORMAL HIGH (ref 40–150)
BUN: 16.4 mg/dL (ref 7.0–26.0)
CALCIUM: 9 mg/dL (ref 8.4–10.4)
CHLORIDE: 105 meq/L (ref 98–109)
CO2: 26 mEq/L (ref 22–29)
Creatinine: 0.9 mg/dL (ref 0.7–1.3)
EGFR: 84 mL/min/{1.73_m2} — ABNORMAL LOW (ref >90)
Glucose: 96 mg/dl (ref 70–140)
Potassium: 4.1 mEq/L (ref 3.5–5.1)
Sodium: 141 mEq/L (ref 136–145)
Total Bilirubin: 0.4 mg/dL (ref 0.20–1.20)
Total Protein: 6.4 g/dL (ref 6.4–8.3)

## 2015-01-23 LAB — CBC WITH DIFFERENTIAL/PLATELET
BASO%: 1.8 % (ref 0.0–2.0)
BASOS ABS: 0 10*3/uL (ref 0.0–0.1)
EOS%: 1.3 % (ref 0.0–7.0)
Eosinophils Absolute: 0 10*3/uL (ref 0.0–0.5)
HEMATOCRIT: 30.1 % — AB (ref 38.4–49.9)
HGB: 10.2 g/dL — ABNORMAL LOW (ref 13.0–17.1)
LYMPH#: 0.5 10*3/uL — AB (ref 0.9–3.3)
LYMPH%: 22.1 % (ref 14.0–49.0)
MCH: 33.5 pg — ABNORMAL HIGH (ref 27.2–33.4)
MCHC: 33.9 g/dL (ref 32.0–36.0)
MCV: 98.9 fL — ABNORMAL HIGH (ref 79.3–98.0)
MONO#: 0.4 10*3/uL (ref 0.1–0.9)
MONO%: 17.1 % — ABNORMAL HIGH (ref 0.0–14.0)
NEUT#: 1.3 10*3/uL — ABNORMAL LOW (ref 1.5–6.5)
NEUT%: 57.7 % (ref 39.0–75.0)
Platelets: 244 10*3/uL (ref 140–400)
RBC: 3.05 10*6/uL — AB (ref 4.20–5.82)
RDW: 14.6 % (ref 11.0–14.6)
WBC: 2.2 10*3/uL — ABNORMAL LOW (ref 4.0–10.3)

## 2015-01-23 MED ORDER — SODIUM CHLORIDE 0.9 % IV SOLN
640.0000 mg/m2 | Freq: Once | INTRAVENOUS | Status: AC
  Administered 2015-01-23: 1292 mg via INTRAVENOUS
  Filled 2015-01-23: qty 33.98

## 2015-01-23 MED ORDER — PROCHLORPERAZINE MALEATE 10 MG PO TABS
10.0000 mg | ORAL_TABLET | Freq: Once | ORAL | Status: AC
  Administered 2015-01-23: 10 mg via ORAL

## 2015-01-23 MED ORDER — SODIUM CHLORIDE 0.9 % IJ SOLN
10.0000 mL | INTRAMUSCULAR | Status: DC | PRN
  Administered 2015-01-23: 10 mL via INTRAVENOUS
  Filled 2015-01-23: qty 10

## 2015-01-23 MED ORDER — PROCHLORPERAZINE MALEATE 10 MG PO TABS
ORAL_TABLET | ORAL | Status: AC
  Filled 2015-01-23: qty 1

## 2015-01-23 MED ORDER — SODIUM CHLORIDE 0.9 % IJ SOLN
10.0000 mL | INTRAMUSCULAR | Status: DC | PRN
  Administered 2015-01-23: 10 mL
  Filled 2015-01-23: qty 10

## 2015-01-23 MED ORDER — HEPARIN SOD (PORK) LOCK FLUSH 100 UNIT/ML IV SOLN
500.0000 [IU] | Freq: Once | INTRAVENOUS | Status: AC | PRN
  Administered 2015-01-23: 500 [IU]
  Filled 2015-01-23: qty 5

## 2015-01-23 MED ORDER — SODIUM CHLORIDE 0.9 % IV SOLN
Freq: Once | INTRAVENOUS | Status: AC
  Administered 2015-01-23: 11:00:00 via INTRAVENOUS

## 2015-01-23 NOTE — Patient Instructions (Signed)

## 2015-01-23 NOTE — Patient Instructions (Signed)
[facility] Discharge Instructions for Patients Receiving Chemotherapy  Today you received the following chemotherapy agents Gemzar  To help prevent nausea and vomiting after your treatment, we encourage you to take your nausea medication as prescribed   If you develop nausea and vomiting that is not controlled by your nausea medication, call the clinic.   BELOW ARE SYMPTOMS THAT SHOULD BE REPORTED IMMEDIATELY:  *FEVER GREATER THAN 100.5 F  *CHILLS WITH OR WITHOUT FEVER  NAUSEA AND VOMITING THAT IS NOT CONTROLLED WITH YOUR NAUSEA MEDICATION  *UNUSUAL SHORTNESS OF BREATH  *UNUSUAL BRUISING OR BLEEDING  TENDERNESS IN MOUTH AND THROAT WITH OR WITHOUT PRESENCE OF ULCERS  *URINARY PROBLEMS  *BOWEL PROBLEMS  UNUSUAL RASH Items with * indicate a potential emergency and should be followed up as soon as possible.  Feel free to call the clinic you have any questions or concerns. The clinic phone number is [phone].  Please show the CHEMO ALERT CARD at check-in to the Emergency Department and triage nurse.   

## 2015-01-23 NOTE — Progress Notes (Signed)
  [place] OFFICE PROGRESS NOTE   Diagnosis: Pancreas cancer  INTERVAL HISTORY:   Ronnie Nicholson returns as scheduled. He was last treated with gemcitabine on 01/16/2015. He tolerated the gemcitabine well. He reports anorexia for a few days following chemotherapy. He had one episode of nausea, relieved with Compazine. No fever or rash. He notes an intermittent "pulling" sensation at the Port-A-Cath site lasting for seconds and occurring days apart. No erythema, tenderness, or arm swelling.  Objective:  Vital signs in last 24 hours:  Blood pressure 123/57, pulse 67, temperature 97.9 F (36.6 C), temperature source Oral, resp. rate 18, height _0  (1.854 m), weight 177 lb 14.4 oz (80.695 kg), SpO2 100 %.    HEENT: No thrush or ulcers Resp: Lungs clear bilaterally, distant breath sounds Cardio: Regular rate and rhythm GI: No hepatomegaly, nontender Vascular: No leg edema   Portacath/PICC-without erythema or tenderness  Lab Results:  Lab Results  Component Value Date   WBC 2.2* 01/23/2015   HGB 10.2* 01/23/2015   HCT 30.1* 01/23/2015   MCV 98.9* 01/23/2015   PLT 244 01/23/2015   NEUTROABS 1.3* 01/23/2015    Medications: I have reviewed the patient's current medications.  Assessment/Plan: 1. 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, M0), status post a cholecystectomy 06/24/2014 with a 2.5 cm gallbladder mucosal mass with extension into attached hepatic parenchyma, positive hepatic parenchymal and cystic duct margins, diffuse lymphovascular invasion  Elevated CA 19-9  Partial liver resection, bile duct resection, lymph node resection on 08/01/2014 with the pathology confirming residual invasive adenocarcinoma in the liver-negative margins, metastatic adenocarcinoma with extranodal extension involving one lymph node  Final pathologic stage IIIB (pT3,pN1)  Initiation of adjuvant gemcitabine 09/05/2014 (09/05/2014, 09/12/2014,  09/19/2014)  Initiation of adjuvant radiation/Xeloda 10/07/2014; completed 11/11/2014  Resumption of adjuvant gemcitabine 11/28/2014 (11/28/2014, 12/12/2014; held on 12/05/2014 due to neutropenia)  2. Intermittent right upper abdominal pain beginning in the summer of 2015-likely related to symptomatic cholelithiasis. Improved.  3. History of colon polyps  4. Family history of multiple cancers including colon cancer, status post a genetics evaluation. Positive for the BRCA1 mutation.  5. Elevated liver enzymes 09/07/2014; improved   Disposition:  Ronnie Nicholson appears stable. He has mild neutropenia on labs today. The plan is to proceed with gemcitabine today. He will contact us for a fever. He will begin gemcitabine on a day one/day 8 schedule starting 02/06/2015 to complete the planned 12 total cycles of gemcitabine.  Betsy Coder, MD  01/23/2015  10:42 AM

## 2015-01-23 NOTE — Progress Notes (Signed)
ANC 1.3; OK to treat per Dr. Benay Spice.

## 2015-01-23 NOTE — Telephone Encounter (Signed)
Appointments made and avs printed for patient °

## 2015-01-30 ENCOUNTER — Ambulatory Visit: Payer: Medicare Other

## 2015-01-30 ENCOUNTER — Other Ambulatory Visit: Payer: Medicare Other

## 2015-02-02 ENCOUNTER — Other Ambulatory Visit: Payer: Self-pay | Admitting: Oncology

## 2015-02-04 ENCOUNTER — Encounter: Payer: Self-pay | Admitting: Cardiovascular Disease

## 2015-02-04 ENCOUNTER — Ambulatory Visit (INDEPENDENT_AMBULATORY_CARE_PROVIDER_SITE_OTHER): Payer: Medicare Other | Admitting: Cardiovascular Disease

## 2015-02-04 VITALS — BP 114/68 | HR 76 | Ht 73.0 in | Wt 178.0 lb

## 2015-02-04 DIAGNOSIS — I10 Essential (primary) hypertension: Secondary | ICD-10-CM

## 2015-02-04 DIAGNOSIS — E785 Hyperlipidemia, unspecified: Secondary | ICD-10-CM

## 2015-02-04 DIAGNOSIS — I350 Nonrheumatic aortic (valve) stenosis: Secondary | ICD-10-CM

## 2015-02-04 DIAGNOSIS — Z136 Encounter for screening for cardiovascular disorders: Secondary | ICD-10-CM | POA: Diagnosis not present

## 2015-02-04 DIAGNOSIS — R002 Palpitations: Secondary | ICD-10-CM

## 2015-02-04 DIAGNOSIS — I6523 Occlusion and stenosis of bilateral carotid arteries: Secondary | ICD-10-CM

## 2015-02-04 DIAGNOSIS — Z789 Other specified health status: Secondary | ICD-10-CM

## 2015-02-04 DIAGNOSIS — Z889 Allergy status to unspecified drugs, medicaments and biological substances status: Secondary | ICD-10-CM

## 2015-02-04 NOTE — Progress Notes (Signed)
Patient ID: Ronnie Nicholson, male   DOB: 22-Apr-1941, 74 y.o.   MRN: [id number]      SUBJECTIVE: The patient is a 74 year old male with a reported history of symptomatic PVCs, carotid artery disease, and aortic stenosis. Echocardiogram performed on 03/20/2013 demonstrated normal left ventricular systolic function, EF 50-93%, grade 1 diastolic dysfunction, and mild aortic stenosis with a mean gradient of 13 mm mercury. Carotid Dopplers on 06/13/2013 demonstrated 1-39% right internal carotid artery stenosis and 60-79% left internal carotid artery stenosis.  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 Underwent cholecystectomy and partial liver resection.  Has not had lipids checked in some time. Refuses statin therapy due to leg aches and fatigue.  Denies chest pain and palpitations as well as dizziness and syncope.  ECG performed in the office today demonstrates normal sinus rhythm with no ischemic ST segment or T-wave abnormalities, nor any arrhythmias.    Review of Systems: As per "subjective", otherwise negative.  Allergies  Allergen Reactions  . Lodine [Etodolac] Other (See Comments)    URINARY RETENTION  . Flexeril [Cyclobenzaprine Hcl] Rash  . Tetracyclines & Related Rash    Current Outpatient Prescriptions  Medication Sig Dispense Refill  . aspirin EC 81 MG tablet Take 81 mg by mouth daily.    Marland Kitchen lidocaine-prilocaine (EMLA) cream Apply small amount of cream over port area 1-2 hours prior to treatment and cover with plastic wrap.  DO NOT RUB IN 30 g PRN  . metoprolol tartrate (LOPRESSOR) 25 MG tablet Take 25 mg by mouth daily.     . prochlorperazine (COMPAZINE) 10 MG tablet Take 1 tablet (10 mg total) by mouth every 6 (six) hours as needed for nausea. 60 tablet 1   No current facility-administered medications for this visit.    Past Medical History  Diagnosis Date  . Arthritis   . Hyperlipidemia   . History  of colon polyps   . Symptomatic PVCs   . COPD (chronic obstructive pulmonary disease)   . Left hydrocele   . Bilateral carotid artery stenosis     PER DUPLEX 11-17-2012---  RICA 2-67%/  LICA  12-45%  . Mild aortic valve stenosis   . Wears glasses   . Skin abrasion     right temple, left hand areas removed by dermatology last week, areas healing  . Family history of colon cancer   . Family history of breast cancer   . Hypertension   . Heart murmur   . HOH (hard of hearing)     wears hearing aids  . Allergy   . Skin cancer     basal cell carcinoma  . Cancer 06/24/14    gallbladder cancer  . BRCA1 positive     Past Surgical History  Procedure Laterality Date  . Stapedectomy Bilateral 1965 left, 1981 right  . Shoulder surgery Right 2004    rotator cuff  . Transthoracic echocardiogram  03-20-2013  DR WALL    GRADE I DIASTOLIC DYSFUNCTION/  EF 65-70%/  MILD AORTIC STENOSIS WITH STABLE GRADIENTS  . Tonsillectomy  1962  . Hydrocele excision Left 05/10/2013    Procedure: HYDROCELECTOMY ADULT;  Surgeon: Franchot Gallo, MD;  Location: [facility];  Service: Urology;  Laterality: Left;  . Colonoscopy    . Cholecystectomy N/A 06/24/2014    Procedure: LAPAROSCOPIC CHOLECYSTECTOMY;  Surgeon: Coralie Keens, MD;  Location: [facility];  Service: General;  Laterality: N/A;  . Open partial hepatectomy [  83]  08/01/2014    Procedure: OPEN PARTIAL HEPATECTOMY, INTRA- OPERATIVE ULTRASOUND, COMMON BILE DUCT RESECTION, ROUX-EN-Y HEPATICOJEJUNOSTOMY;  Surgeon: [name], MD;  Location: [facility];  Service: General;;  . Portacath placement N/A 08/26/2014    Procedure: INSERTION PORT-A-CATH;  Surgeon: [name], MD;  Location: [facility];  Service: General;  Laterality: N/A;    Social History   Social History  . Marital Status: Married    Spouse Name: Thayer Headings  . Number of Children: 1  . Years of Education: N/A   Occupational History  . retired     08-2003    Social History Main Topics  . Smoking status: Former Smoker -- 1.00 packs/day for 10 years    Types: Cigarettes    Start date: 06/07/1954    Quit date: 06/18/1983  . Smokeless tobacco: Never Used  . Alcohol Use: No  . Drug Use: No  . Sexual Activity: Not on file   Other Topics Concern  . Not on file   Social History Narrative     Filed Vitals:   02/04/15 1117  BP: 114/68  Pulse: 76  Height: 6' 1"  (1.854 m)  Weight: 178 lb (80.74 kg)  SpO2: 99%    PHYSICAL EXAM General: NAD Neck: No JVD, no thyromegaly. Lungs: Clear to auscultation bilaterally with normal respiratory effort. CV: Nondisplaced PMI. Regular rate and rhythm, normal S1/diminished S2, no S3/S4, III/VI ejection systolic murmur over RUSB. No pretibial or periankle edema. Bilateral carotid bruits. Normal pedal pulses.  Abdomen: Incisional scar appreciated, no distention.  Neurologic: Alert and oriented x 3.  Psych: Normal affect. Extremities: No clubbing or cyanosis.   ECG: Most recent ECG reviewed.      ASSESSMENT AND PLAN: 1. Symptomatic PVC's: Symptomatically stable on metoprolol 25 mg twice daily.  2. Aortic stenosis: Most recently assessed as mild in October 2014. This appears to be stable with no reported symptoms.  3. Carotid artery disease: Moderate blockage on the left (60-79%) in January 2015 with mild blockage on the right. I will reassess with carotid Dopplers.  4. Hyperlipidemia: Will check lipids. Refused statin therapy in past.  Dispo: f/u 1 year.   Kate Sable, M.D., F.A.C.C.

## 2015-02-04 NOTE — Patient Instructions (Signed)
Your physician recommends that you continue on your current medications as directed. Please refer to the Current Medication list given to you today. Your physician recommends that you return for a FASTING lipid profile as soon as possible.  Your physician has requested that you have a carotid duplex. This test is an ultrasound of the carotid arteries in your neck. It looks at blood flow through these arteries that supply the brain with blood. Allow one hour for this exam. There are no restrictions or special instructions. Your physician recommends that you schedule a follow-up appointment in: 1 year. You will receive a reminder letter in the mail in about 10 months reminding you to call and schedule your appointment. If you don't receive this letter, please contact our office.

## 2015-02-06 ENCOUNTER — Ambulatory Visit (HOSPITAL_BASED_OUTPATIENT_CLINIC_OR_DEPARTMENT_OTHER): Payer: Medicare Other

## 2015-02-06 ENCOUNTER — Ambulatory Visit: Payer: Medicare Other

## 2015-02-06 ENCOUNTER — Other Ambulatory Visit (HOSPITAL_BASED_OUTPATIENT_CLINIC_OR_DEPARTMENT_OTHER): Payer: Medicare Other

## 2015-02-06 VITALS — BP 119/60 | HR 59 | Temp 97.0°F | Resp 20

## 2015-02-06 DIAGNOSIS — C787 Secondary malignant neoplasm of liver and intrahepatic bile duct: Secondary | ICD-10-CM

## 2015-02-06 DIAGNOSIS — C23 Malignant neoplasm of gallbladder: Secondary | ICD-10-CM

## 2015-02-06 DIAGNOSIS — Z95828 Presence of other vascular implants and grafts: Secondary | ICD-10-CM

## 2015-02-06 DIAGNOSIS — Z5111 Encounter for antineoplastic chemotherapy: Secondary | ICD-10-CM | POA: Diagnosis present

## 2015-02-06 LAB — CBC WITH DIFFERENTIAL/PLATELET
BASO%: 0.4 % (ref 0.0–2.0)
Basophils Absolute: 0 10*3/uL (ref 0.0–0.1)
EOS%: 2 % (ref 0.0–7.0)
Eosinophils Absolute: 0.1 10*3/uL (ref 0.0–0.5)
HCT: 31.8 % — ABNORMAL LOW (ref 38.4–49.9)
HGB: 10.6 g/dL — ABNORMAL LOW (ref 13.0–17.1)
LYMPH#: 0.6 10*3/uL — AB (ref 0.9–3.3)
LYMPH%: 11.6 % — AB (ref 14.0–49.0)
MCH: 33.5 pg — AB (ref 27.2–33.4)
MCHC: 33.3 g/dL (ref 32.0–36.0)
MCV: 100.6 fL — ABNORMAL HIGH (ref 79.3–98.0)
MONO#: 0.7 10*3/uL (ref 0.1–0.9)
MONO%: 12.4 % (ref 0.0–14.0)
NEUT#: 4 10*3/uL (ref 1.5–6.5)
NEUT%: 73.6 % (ref 39.0–75.0)
Platelets: 213 10*3/uL (ref 140–400)
RBC: 3.16 10*6/uL — AB (ref 4.20–5.82)
RDW: 14.7 % — ABNORMAL HIGH (ref 11.0–14.6)
WBC: 5.4 10*3/uL (ref 4.0–10.3)

## 2015-02-06 LAB — COMPREHENSIVE METABOLIC PANEL (CC13)
ALBUMIN: 3.2 g/dL — AB (ref 3.5–5.0)
ALK PHOS: 454 U/L — AB (ref 40–150)
ALT: 39 U/L (ref 0–55)
AST: 38 U/L — ABNORMAL HIGH (ref 5–34)
Anion Gap: 6 mEq/L (ref 3–11)
BILIRUBIN TOTAL: 0.43 mg/dL (ref 0.20–1.20)
BUN: 20.4 mg/dL (ref 7.0–26.0)
CO2: 28 mEq/L (ref 22–29)
CREATININE: 0.8 mg/dL (ref 0.7–1.3)
Calcium: 8.9 mg/dL (ref 8.4–10.4)
Chloride: 107 mEq/L (ref 98–109)
EGFR: 85 mL/min/{1.73_m2} — AB (ref >90)
GLUCOSE: 97 mg/dL (ref 70–140)
Potassium: 4.3 mEq/L (ref 3.5–5.1)
SODIUM: 141 meq/L (ref 136–145)
TOTAL PROTEIN: 6.4 g/dL (ref 6.4–8.3)

## 2015-02-06 MED ORDER — SODIUM CHLORIDE 0.9 % IJ SOLN
10.0000 mL | INTRAMUSCULAR | Status: DC | PRN
  Administered 2015-02-06: 10 mL
  Filled 2015-02-06: qty 10

## 2015-02-06 MED ORDER — SODIUM CHLORIDE 0.9 % IJ SOLN
10.0000 mL | INTRAMUSCULAR | Status: DC | PRN
  Administered 2015-02-06: 10 mL via INTRAVENOUS
  Filled 2015-02-06: qty 10

## 2015-02-06 MED ORDER — PROCHLORPERAZINE MALEATE 10 MG PO TABS
10.0000 mg | ORAL_TABLET | Freq: Once | ORAL | Status: AC
  Administered 2015-02-06: 10 mg via ORAL

## 2015-02-06 MED ORDER — HEPARIN SOD (PORK) LOCK FLUSH 100 UNIT/ML IV SOLN
500.0000 [IU] | Freq: Once | INTRAVENOUS | Status: AC | PRN
  Administered 2015-02-06: 500 [IU]
  Filled 2015-02-06: qty 5

## 2015-02-06 MED ORDER — SODIUM CHLORIDE 0.9 % IV SOLN
640.0000 mg/m2 | Freq: Once | INTRAVENOUS | Status: AC
  Administered 2015-02-06: 1292 mg via INTRAVENOUS
  Filled 2015-02-06: qty 33.98

## 2015-02-06 MED ORDER — SODIUM CHLORIDE 0.9 % IV SOLN
Freq: Once | INTRAVENOUS | Status: AC
  Administered 2015-02-06: 13:00:00 via INTRAVENOUS

## 2015-02-06 MED ORDER — PROCHLORPERAZINE MALEATE 10 MG PO TABS
ORAL_TABLET | ORAL | Status: AC
  Filled 2015-02-06: qty 1

## 2015-02-06 NOTE — Patient Instructions (Signed)

## 2015-02-06 NOTE — Patient Instructions (Signed)
[facility] Discharge Instructions for Patients Receiving Chemotherapy  Today you received the following chemotherapy agents Gemzar  To help prevent nausea and vomiting after your treatment, we encourage you to take your nausea medication as prescribed   If you develop nausea and vomiting that is not controlled by your nausea medication, call the clinic.   BELOW ARE SYMPTOMS THAT SHOULD BE REPORTED IMMEDIATELY:  *FEVER GREATER THAN 100.5 F  *CHILLS WITH OR WITHOUT FEVER  NAUSEA AND VOMITING THAT IS NOT CONTROLLED WITH YOUR NAUSEA MEDICATION  *UNUSUAL SHORTNESS OF BREATH  *UNUSUAL BRUISING OR BLEEDING  TENDERNESS IN MOUTH AND THROAT WITH OR WITHOUT PRESENCE OF ULCERS  *URINARY PROBLEMS  *BOWEL PROBLEMS  UNUSUAL RASH Items with * indicate a potential emergency and should be followed up as soon as possible.  Feel free to call the clinic you have any questions or concerns. The clinic phone number is [phone].  Please show the CHEMO ALERT CARD at check-in to the Emergency Department and triage nurse.   

## 2015-02-11 ENCOUNTER — Ambulatory Visit (HOSPITAL_COMMUNITY)
Admission: RE | Admit: 2015-02-11 | Discharge: 2015-02-11 | Disposition: A | Discharge status: Home / Self Care | Payer: Medicare Other | Source: Ambulatory Visit | Attending: Cardiovascular Disease | Admitting: Cardiovascular Disease

## 2015-02-11 ENCOUNTER — Telehealth: Payer: Self-pay

## 2015-02-11 DIAGNOSIS — E785 Hyperlipidemia, unspecified: Secondary | ICD-10-CM | POA: Insufficient documentation

## 2015-02-11 DIAGNOSIS — Z87891 Personal history of nicotine dependence: Secondary | ICD-10-CM | POA: Diagnosis not present

## 2015-02-11 DIAGNOSIS — I6523 Occlusion and stenosis of bilateral carotid arteries: Secondary | ICD-10-CM | POA: Diagnosis not present

## 2015-02-11 DIAGNOSIS — C23 Malignant neoplasm of gallbladder: Secondary | ICD-10-CM

## 2015-02-11 DIAGNOSIS — I10 Essential (primary) hypertension: Secondary | ICD-10-CM | POA: Insufficient documentation

## 2015-02-11 NOTE — Telephone Encounter (Signed)
US Carotid Duplex Bilateral  Status: Finalresult Visible to patient:  Not Released Nextappt: 02/13/2015 at 08:45 AM in Oncology [facility] 4) Dx:  Carotid stenosis, bilateral       Notes Recorded by Herminio Commons, MD on 02/11/2015 at 4:49 PM Moderate right-sided blockage and now severe left-sided blockage by this study. Would have him referred to vascular surgery for their assessment.   Details

## 2015-02-11 NOTE — Telephone Encounter (Signed)
-----   Message from Herminio Commons, MD sent at 02/11/2015  4:49 PM EDT ----- Moderate right-sided blockage and now severe left-sided blockage by this study. Would have him referred to vascular surgery for their assessment.

## 2015-02-12 ENCOUNTER — Telehealth: Payer: Self-pay

## 2015-02-12 DIAGNOSIS — E785 Hyperlipidemia, unspecified: Secondary | ICD-10-CM

## 2015-02-12 MED ORDER — EZETIMIBE 10 MG PO TABS: 10.0000 mg | ORAL_TABLET | Freq: Every day | ORAL | Status: DC

## 2015-02-12 NOTE — Telephone Encounter (Signed)
-----   Message from Herminio Commons, MD sent at 02/12/2015  1:19 PM EDT ----- Lipids are high. Refuses statin therapy. Would start Zetia 10 mg daily and repeat in 3 months.

## 2015-02-12 NOTE — Telephone Encounter (Signed)
Pt will start zetia 10mg  daily, I mailed lab slips to his home,e-escribed rx

## 2015-02-13 ENCOUNTER — Encounter: Payer: Self-pay | Admitting: *Deleted

## 2015-02-13 ENCOUNTER — Telehealth: Payer: Self-pay | Admitting: Oncology

## 2015-02-13 ENCOUNTER — Other Ambulatory Visit (HOSPITAL_BASED_OUTPATIENT_CLINIC_OR_DEPARTMENT_OTHER): Payer: Medicare Other

## 2015-02-13 ENCOUNTER — Ambulatory Visit: Payer: Medicare Other

## 2015-02-13 ENCOUNTER — Ambulatory Visit (HOSPITAL_BASED_OUTPATIENT_CLINIC_OR_DEPARTMENT_OTHER): Payer: Medicare Other

## 2015-02-13 ENCOUNTER — Ambulatory Visit (HOSPITAL_BASED_OUTPATIENT_CLINIC_OR_DEPARTMENT_OTHER): Payer: Medicare Other | Admitting: Oncology

## 2015-02-13 ENCOUNTER — Other Ambulatory Visit: Payer: Medicare Other

## 2015-02-13 VITALS — BP 131/66 | HR 70 | Temp 98.1°F | Resp 18 | Ht 73.0 in | Wt 181.3 lb

## 2015-02-13 DIAGNOSIS — C23 Malignant neoplasm of gallbladder: Secondary | ICD-10-CM | POA: Diagnosis present

## 2015-02-13 DIAGNOSIS — C787 Secondary malignant neoplasm of liver and intrahepatic bile duct: Secondary | ICD-10-CM

## 2015-02-13 DIAGNOSIS — Z95828 Presence of other vascular implants and grafts: Secondary | ICD-10-CM

## 2015-02-13 DIAGNOSIS — I6523 Occlusion and stenosis of bilateral carotid arteries: Secondary | ICD-10-CM | POA: Diagnosis not present

## 2015-02-13 DIAGNOSIS — Z5111 Encounter for antineoplastic chemotherapy: Secondary | ICD-10-CM | POA: Diagnosis present

## 2015-02-13 LAB — COMPREHENSIVE METABOLIC PANEL (CC13)
ALK PHOS: 436 U/L — AB (ref 40–150)
ALT: 39 U/L (ref 0–55)
AST: 38 U/L — AB (ref 5–34)
Albumin: 3 g/dL — ABNORMAL LOW (ref 3.5–5.0)
Anion Gap: 7 mEq/L (ref 3–11)
BUN: 11.7 mg/dL (ref 7.0–26.0)
CHLORIDE: 106 meq/L (ref 98–109)
CO2: 28 mEq/L (ref 22–29)
Calcium: 9 mg/dL (ref 8.4–10.4)
Creatinine: 0.8 mg/dL (ref 0.7–1.3)
EGFR: 87 mL/min/{1.73_m2} — AB (ref >90)
GLUCOSE: 119 mg/dL (ref 70–140)
POTASSIUM: 3.8 meq/L (ref 3.5–5.1)
SODIUM: 141 meq/L (ref 136–145)
Total Bilirubin: 0.49 mg/dL (ref 0.20–1.20)
Total Protein: 6.2 g/dL — ABNORMAL LOW (ref 6.4–8.3)

## 2015-02-13 LAB — CBC WITH DIFFERENTIAL/PLATELET
BASO%: 1.7 % (ref 0.0–2.0)
BASOS ABS: 0 10*3/uL (ref 0.0–0.1)
EOS ABS: 0 10*3/uL (ref 0.0–0.5)
EOS%: 1.8 % (ref 0.0–7.0)
HCT: 29.8 % — ABNORMAL LOW (ref 38.4–49.9)
HGB: 10.1 g/dL — ABNORMAL LOW (ref 13.0–17.1)
LYMPH%: 15.3 % (ref 14.0–49.0)
MCH: 33.5 pg — AB (ref 27.2–33.4)
MCHC: 33.9 g/dL (ref 32.0–36.0)
MCV: 99 fL — AB (ref 79.3–98.0)
MONO#: 0.4 10*3/uL (ref 0.1–0.9)
MONO%: 15.3 % — AB (ref 0.0–14.0)
NEUT#: 1.6 10*3/uL (ref 1.5–6.5)
NEUT%: 65.9 % (ref 39.0–75.0)
Platelets: 239 10*3/uL (ref 140–400)
RBC: 3.01 10*6/uL — AB (ref 4.20–5.82)
RDW: 14.7 % — AB (ref 11.0–14.6)
WBC: 2.5 10*3/uL — ABNORMAL LOW (ref 4.0–10.3)
lymph#: 0.4 10*3/uL — ABNORMAL LOW (ref 0.9–3.3)

## 2015-02-13 MED ORDER — SODIUM CHLORIDE 0.9 % IJ SOLN
10.0000 mL | INTRAMUSCULAR | Status: DC | PRN
  Administered 2015-02-13: 10 mL via INTRAVENOUS
  Filled 2015-02-13: qty 10

## 2015-02-13 MED ORDER — SODIUM CHLORIDE 0.9 % IJ SOLN
10.0000 mL | INTRAMUSCULAR | Status: DC | PRN
  Administered 2015-02-13: 10 mL
  Filled 2015-02-13: qty 10

## 2015-02-13 MED ORDER — GEMCITABINE HCL CHEMO INJECTION 1 GM/26.3ML
640.0000 mg/m2 | Freq: Once | INTRAVENOUS | Status: AC
  Administered 2015-02-13: 1292 mg via INTRAVENOUS
  Filled 2015-02-13: qty 33.98

## 2015-02-13 MED ORDER — PROCHLORPERAZINE MALEATE 10 MG PO TABS
ORAL_TABLET | ORAL | Status: AC
  Filled 2015-02-13: qty 1

## 2015-02-13 MED ORDER — HEPARIN SOD (PORK) LOCK FLUSH 100 UNIT/ML IV SOLN
500.0000 [IU] | Freq: Once | INTRAVENOUS | Status: AC | PRN
  Administered 2015-02-13: 500 [IU]
  Filled 2015-02-13: qty 5

## 2015-02-13 MED ORDER — SODIUM CHLORIDE 0.9 % IV SOLN
Freq: Once | INTRAVENOUS | Status: AC
  Administered 2015-02-13: 11:00:00 via INTRAVENOUS

## 2015-02-13 MED ORDER — PROCHLORPERAZINE MALEATE 10 MG PO TABS
10.0000 mg | ORAL_TABLET | Freq: Once | ORAL | Status: AC
  Administered 2015-02-13: 10 mg via ORAL

## 2015-02-13 NOTE — Progress Notes (Signed)
Ronnie Nicholson   Diagnosis: Gallbladder carcinoma  INTERVAL HISTORY:   Ronnie Nicholson returns as scheduled. He completed another cycle of gemcitabine 02/06/2015. No fever, rash, or dyspnea. He continues to have intermittent discomfort at the upper abdomen lasting for seconds. The pain resolves spontaneously. Good appetite. He has been diagnosed with carotid artery disease and is being referred to consider surgery.  Objective:  Vital signs in last 24 hours:  Blood pressure 131/66, pulse 70, temperature 98.1 F (36.7 C), temperature source Oral, resp. rate 18, height _0  (1.854 m), weight 181 lb 4.8 oz (82.237 kg), SpO2 100 %.    HEENT: No thrush or ulcers Resp: Lungs clear bilaterally Cardio: Regular rate and rhythm GI: No hepatosplenomegaly, nontender, no mass Vascular: No leg edema  Portacath/PICC-without erythema  Lab Results:  Lab Results  Component Value Date   WBC 2.5* 02/13/2015   HGB 10.1* 02/13/2015   HCT 29.8* 02/13/2015   MCV 99.0* 02/13/2015   PLT 239 02/13/2015   NEUTROABS 1.6 02/13/2015      Imaging:  US Carotid Duplex Bilateral  02/11/2015   CLINICAL DATA:  Bilateral carotid stenosis. Hypertension, hyperlipidemia, previous tobacco abuse.  EXAM: BILATERAL CAROTID DUPLEX ULTRASOUND  TECHNIQUE: Pearline Cables scale imaging, color Doppler and duplex ultrasound was performed of bilateral carotid and vertebral arteries in the neck.  COMPARISON:  None available  REVIEW OF SYSTEMS: Quantification of carotid stenosis is based on velocity parameters that correlate the residual internal carotid diameter with NASCET-based stenosis levels, using the diameter of the distal internal carotid lumen as the denominator for stenosis measurement.  The following velocity measurements were obtained:  PEAK SYSTOLIC/END DIASTOLIC  RIGHT  ICA:                     134/43cm/sec  CCA:                     74/25ZD/GLO  SYSTOLIC ICA/CCA RATIO:  1.4  DIASTOLIC ICA/CCA  RATIO: 1.9  ECA:                     133cm/sec  LEFT  ICA:                     293/104cm/sec  CCA:                     756/43PI/RJJ  SYSTOLIC ICA/CCA RATIO:  2.2  DIASTOLIC ICA/CCA RATIO: 4.3  ECA:                     110cm/sec  FINDINGS: RIGHT CAROTID ARTERY: Intimal thickening through the common carotid artery. Smooth circumferential plaque in the carotid bulb. Partially calcified circumferential plaque in the proximal ICA resulting in at least mild stenosis. Normal waveforms and color Doppler signal.  RIGHT VERTEBRAL ARTERY:  Normal flow direction and waveform.  LEFT CAROTID ARTERY: Smooth eccentric plaque in the mid and distal common carotid artery. Circumferential plaque in the bulb. Somewhat irregular partially calcified plaque in the proximal ICA with markedly elevated peak systolic velocities measured distal to the lesion. Elsewhere normal waveforms and color Doppler signal.  LEFT VERTEBRAL ARTERY: Normal flow direction and waveform.  IMPRESSION: 1. Bilateral carotid bifurcation and proximal ICA plaque, resulting in 50-69% diameter ICA stenosis on the right, 70-99% diameter stenosis on the left. Consider vascular surgical or interventional radiology consultation.   Electronically Signed   By: Eden Emms.D.  On: 02/11/2015 15:01    Medications: I have reviewed the patient's current medications.  Assessment/Plan: 1. 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, M0), status post a cholecystectomy 06/24/2014 with a 2.5 cm gallbladder mucosal mass with extension into attached hepatic parenchyma, positive hepatic parenchymal and cystic duct margins, diffuse lymphovascular invasion  Elevated CA 19-9  Partial liver resection, bile duct resection, lymph node resection on 08/01/2014 with the pathology confirming residual invasive adenocarcinoma in the liver-negative margins, metastatic adenocarcinoma with extranodal extension involving one lymph node  Final pathologic stage  IIIB (pT3,pN1)  Initiation of adjuvant gemcitabine 09/05/2014 (09/05/2014, 09/12/2014, 09/19/2014)  Initiation of adjuvant radiation/Xeloda 10/07/2014; completed 11/11/2014  Resumption of adjuvant gemcitabine 11/28/2014   2. Intermittent right upper abdominal pain beginning in the summer of 2015-likely related to symptomatic cholelithiasis. Improved.  3. History of colon polyps  4. Family history of multiple cancers including colon cancer, status post a genetics evaluation. Positive for the BRCA1 mutation.  5. Elevated liver enzymes 09/07/2014; improved    Disposition:  Ronnie Nicholson appears stable. He will complete 2 final treatments with gemcitabine today and on 02/27/2015. He will return for an office and lab visit 03/19/2015. We will check the CA 19-9 when he is here on 02/27/2015.  The upper abdominal pain is most likely neuropathic pain related to surgery.   Betsy Coder, MD  02/13/2015  10:04 AM

## 2015-02-13 NOTE — Patient Instructions (Signed)

## 2015-02-13 NOTE — Addendum Note (Signed)
Addended by: Adalberto Cole on: 02/13/2015 02:02 PM   Modules accepted: Medications

## 2015-02-13 NOTE — Telephone Encounter (Signed)
Gave patient avs reort and appointments for September and October.

## 2015-02-13 NOTE — Progress Notes (Signed)
Oncology Nurse Navigator Documentation  Oncology Nurse Navigator Flowsheets 02/13/2015  Navigator Encounter Type Treatment;3 month  Patient Visit Type Medonc  Treatment Phase Treatment  Barriers/Navigation Needs No barriers at this time  Interventions None required

## 2015-02-13 NOTE — Patient Instructions (Signed)
[facility] Discharge Instructions for Patients Receiving Chemotherapy  Today you received the following chemotherapy agents gemzar.  To help prevent nausea and vomiting after your treatment, we encourage you to take your nausea medication.   If you develop nausea and vomiting that is not controlled by your nausea medication, call the clinic.   BELOW ARE SYMPTOMS THAT SHOULD BE REPORTED IMMEDIATELY:  *FEVER GREATER THAN 100.5 F  *CHILLS WITH OR WITHOUT FEVER  NAUSEA AND VOMITING THAT IS NOT CONTROLLED WITH YOUR NAUSEA MEDICATION  *UNUSUAL SHORTNESS OF BREATH  *UNUSUAL BRUISING OR BLEEDING  TENDERNESS IN MOUTH AND THROAT WITH OR WITHOUT PRESENCE OF ULCERS  *URINARY PROBLEMS  *BOWEL PROBLEMS  UNUSUAL RASH Items with * indicate a potential emergency and should be followed up as soon as possible.  Feel free to call the clinic you have any questions or concerns. The clinic phone number is [phone].  Please show the CHEMO ALERT CARD at check-in to the Emergency Department and triage nurse.   

## 2015-02-19 ENCOUNTER — Telehealth: Payer: Self-pay | Admitting: Cardiovascular Disease

## 2015-02-19 NOTE — Telephone Encounter (Signed)
Pt wanted to know numbers of LDL HDL and total cholesterol, verbally gave pt results. Pt has started Zetia as per result note. Pt says he will come to office to sign DPR for wife to be able to get health information as pt cannot hear well.

## 2015-02-19 NOTE — Telephone Encounter (Signed)
Pt would like to know what his cholesterol reading from his lab results

## 2015-02-23 ENCOUNTER — Other Ambulatory Visit: Payer: Self-pay | Admitting: Oncology

## 2015-02-27 ENCOUNTER — Other Ambulatory Visit (HOSPITAL_BASED_OUTPATIENT_CLINIC_OR_DEPARTMENT_OTHER): Payer: Medicare Other | Admitting: *Deleted

## 2015-02-27 ENCOUNTER — Ambulatory Visit: Payer: Medicare Other

## 2015-02-27 ENCOUNTER — Other Ambulatory Visit (HOSPITAL_BASED_OUTPATIENT_CLINIC_OR_DEPARTMENT_OTHER): Payer: Medicare Other

## 2015-02-27 ENCOUNTER — Ambulatory Visit (HOSPITAL_BASED_OUTPATIENT_CLINIC_OR_DEPARTMENT_OTHER): Payer: Medicare Other

## 2015-02-27 VITALS — BP 135/56 | HR 65 | Temp 97.8°F | Resp 16

## 2015-02-27 DIAGNOSIS — C23 Malignant neoplasm of gallbladder: Secondary | ICD-10-CM | POA: Diagnosis present

## 2015-02-27 DIAGNOSIS — C787 Secondary malignant neoplasm of liver and intrahepatic bile duct: Secondary | ICD-10-CM | POA: Diagnosis not present

## 2015-02-27 DIAGNOSIS — Z5111 Encounter for antineoplastic chemotherapy: Secondary | ICD-10-CM | POA: Diagnosis present

## 2015-02-27 DIAGNOSIS — C801 Malignant (primary) neoplasm, unspecified: Secondary | ICD-10-CM

## 2015-02-27 DIAGNOSIS — Z95828 Presence of other vascular implants and grafts: Secondary | ICD-10-CM

## 2015-02-27 LAB — COMPREHENSIVE METABOLIC PANEL (CC13)
ALT: 46 U/L (ref 0–55)
AST: 50 U/L — AB (ref 5–34)
Albumin: 3.1 g/dL — ABNORMAL LOW (ref 3.5–5.0)
Alkaline Phosphatase: 493 U/L — ABNORMAL HIGH (ref 40–150)
Anion Gap: 6 mEq/L (ref 3–11)
BUN: 14.6 mg/dL (ref 7.0–26.0)
CHLORIDE: 105 meq/L (ref 98–109)
CO2: 28 mEq/L (ref 22–29)
Calcium: 8.4 mg/dL (ref 8.4–10.4)
Creatinine: 0.8 mg/dL (ref 0.7–1.3)
EGFR: 86 mL/min/{1.73_m2} — AB (ref >90)
GLUCOSE: 90 mg/dL (ref 70–140)
POTASSIUM: 4.5 meq/L (ref 3.5–5.1)
SODIUM: 138 meq/L (ref 136–145)
Total Bilirubin: 0.4 mg/dL (ref 0.20–1.20)
Total Protein: 6.3 g/dL — ABNORMAL LOW (ref 6.4–8.3)

## 2015-02-27 LAB — CBC WITH DIFFERENTIAL/PLATELET
BASO%: 0.6 % (ref 0.0–2.0)
Basophils Absolute: 0 10*3/uL (ref 0.0–0.1)
EOS%: 1.6 % (ref 0.0–7.0)
Eosinophils Absolute: 0.1 10*3/uL (ref 0.0–0.5)
HCT: 31.1 % — ABNORMAL LOW (ref 38.4–49.9)
HGB: 10.3 g/dL — ABNORMAL LOW (ref 13.0–17.1)
LYMPH#: 0.7 10*3/uL — AB (ref 0.9–3.3)
LYMPH%: 15.2 % (ref 14.0–49.0)
MCH: 32.6 pg (ref 27.2–33.4)
MCHC: 33.1 g/dL (ref 32.0–36.0)
MCV: 98.4 fL — AB (ref 79.3–98.0)
MONO#: 0.8 10*3/uL (ref 0.1–0.9)
MONO%: 15.8 % — AB (ref 0.0–14.0)
NEUT#: 3.2 10*3/uL (ref 1.5–6.5)
NEUT%: 66.8 % (ref 39.0–75.0)
Platelets: 211 10*3/uL (ref 140–400)
RBC: 3.16 10*6/uL — ABNORMAL LOW (ref 4.20–5.82)
RDW: 14.7 % — ABNORMAL HIGH (ref 11.0–14.6)
WBC: 4.9 10*3/uL (ref 4.0–10.3)
nRBC: 0 % (ref 0–0)

## 2015-02-27 MED ORDER — SODIUM CHLORIDE 0.9 % IJ SOLN
10.0000 mL | INTRAMUSCULAR | Status: DC | PRN
  Administered 2015-02-27: 10 mL
  Filled 2015-02-27: qty 10

## 2015-02-27 MED ORDER — PROCHLORPERAZINE MALEATE 10 MG PO TABS
ORAL_TABLET | ORAL | Status: AC
  Filled 2015-02-27: qty 1

## 2015-02-27 MED ORDER — SODIUM CHLORIDE 0.9 % IV SOLN
Freq: Once | INTRAVENOUS | Status: AC
  Administered 2015-02-27: 15:00:00 via INTRAVENOUS

## 2015-02-27 MED ORDER — SODIUM CHLORIDE 0.9 % IJ SOLN
10.0000 mL | INTRAMUSCULAR | Status: DC | PRN
  Administered 2015-02-27: 10 mL via INTRAVENOUS
  Filled 2015-02-27: qty 10

## 2015-02-27 MED ORDER — HEPARIN SOD (PORK) LOCK FLUSH 100 UNIT/ML IV SOLN
500.0000 [IU] | Freq: Once | INTRAVENOUS | Status: AC | PRN
  Administered 2015-02-27: 500 [IU]
  Filled 2015-02-27: qty 5

## 2015-02-27 MED ORDER — SODIUM CHLORIDE 0.9 % IV SOLN
640.0000 mg/m2 | Freq: Once | INTRAVENOUS | Status: AC
  Administered 2015-02-27: 1292 mg via INTRAVENOUS
  Filled 2015-02-27: qty 33.98

## 2015-02-27 MED ORDER — PROCHLORPERAZINE MALEATE 10 MG PO TABS
10.0000 mg | ORAL_TABLET | Freq: Once | ORAL | Status: AC
  Administered 2015-02-27: 10 mg via ORAL

## 2015-02-27 NOTE — Patient Instructions (Signed)

## 2015-02-27 NOTE — Patient Instructions (Signed)
[facility] Discharge Instructions for Patients Receiving Chemotherapy  Today you received the following chemotherapy agents gemzar.  To help prevent nausea and vomiting after your treatment, we encourage you to take your nausea medication.   If you develop nausea and vomiting that is not controlled by your nausea medication, call the clinic.   BELOW ARE SYMPTOMS THAT SHOULD BE REPORTED IMMEDIATELY:  *FEVER GREATER THAN 100.5 F  *CHILLS WITH OR WITHOUT FEVER  NAUSEA AND VOMITING THAT IS NOT CONTROLLED WITH YOUR NAUSEA MEDICATION  *UNUSUAL SHORTNESS OF BREATH  *UNUSUAL BRUISING OR BLEEDING  TENDERNESS IN MOUTH AND THROAT WITH OR WITHOUT PRESENCE OF ULCERS  *URINARY PROBLEMS  *BOWEL PROBLEMS  UNUSUAL RASH Items with * indicate a potential emergency and should be followed up as soon as possible.  Feel free to call the clinic you have any questions or concerns. The clinic phone number is [phone].  Please show the CHEMO ALERT CARD at check-in to the Emergency Department and triage nurse.   

## 2015-02-28 LAB — CANCER ANTIGEN 19-9: CA 19-9: 63.2 U/mL — ABNORMAL HIGH (ref <35.0)

## 2015-03-05 ENCOUNTER — Other Ambulatory Visit: Payer: Self-pay | Admitting: Cardiovascular Disease

## 2015-03-10 ENCOUNTER — Other Ambulatory Visit: Payer: Self-pay | Admitting: *Deleted

## 2015-03-10 DIAGNOSIS — I6522 Occlusion and stenosis of left carotid artery: Secondary | ICD-10-CM

## 2015-03-11 ENCOUNTER — Encounter: Payer: Self-pay | Admitting: Vascular Surgery

## 2015-03-12 ENCOUNTER — Other Ambulatory Visit: Payer: Self-pay | Admitting: Cardiovascular Disease

## 2015-03-12 NOTE — Telephone Encounter (Signed)
Pt went to the pharmacy and was told that his Metoprolol could not be refilled

## 2015-03-13 ENCOUNTER — Encounter: Payer: Self-pay | Admitting: Vascular Surgery

## 2015-03-13 ENCOUNTER — Ambulatory Visit (INDEPENDENT_AMBULATORY_CARE_PROVIDER_SITE_OTHER): Payer: Medicare Other | Admitting: Vascular Surgery

## 2015-03-13 ENCOUNTER — Ambulatory Visit (HOSPITAL_COMMUNITY)
Admission: RE | Admit: 2015-03-13 | Discharge: 2015-03-13 | Disposition: A | Discharge status: Home / Self Care | Payer: Medicare Other | Source: Ambulatory Visit | Attending: Vascular Surgery | Admitting: Vascular Surgery

## 2015-03-13 VITALS — BP 132/75 | HR 63 | Ht 73.0 in | Wt 178.1 lb

## 2015-03-13 DIAGNOSIS — E785 Hyperlipidemia, unspecified: Secondary | ICD-10-CM | POA: Insufficient documentation

## 2015-03-13 DIAGNOSIS — I10 Essential (primary) hypertension: Secondary | ICD-10-CM | POA: Diagnosis not present

## 2015-03-13 DIAGNOSIS — I6523 Occlusion and stenosis of bilateral carotid arteries: Secondary | ICD-10-CM | POA: Diagnosis not present

## 2015-03-13 DIAGNOSIS — I6522 Occlusion and stenosis of left carotid artery: Secondary | ICD-10-CM | POA: Insufficient documentation

## 2015-03-13 MED ORDER — METOPROLOL TARTRATE 25 MG PO TABS: 25.0000 mg | ORAL_TABLET | Freq: Every day | ORAL | Status: DC

## 2015-03-13 NOTE — Addendum Note (Signed)
Addended by: Dorthula Rue L on: 03/13/2015 04:44 PM   Modules accepted: Orders

## 2015-03-13 NOTE — Progress Notes (Addendum)
New Carotid Patient  Referred by:  Mikey Kirschner, MD [place],  [address]  Reason for referral: left carotid stenosis  History of Present Illness  Ronnie Nicholson is a 74 y.o. (01/23/1941) male who presents for evaluation of his left carotid stenosis. His carotid disease has been followed for the past two years by his cardiologist, Dr. Bronson Ing. His most recent carotid duplex on 02/11/2015 revealed a left internal carotid stenosis of 70-99%. He was referred here following that duplex result. Patient has no prior history of TIA or stroke symptom.  The patient has never had amaurosis fugax or monocular blindness.  The patient has never had facial drooping or hemiplegia.  The patient has not had receptive or expressive aphasia.  The patient's risks factors for carotid disease include: hyperlipidemia and former smoker.   About 3 weeks ago, he had a syncopal episode that lasted about 2-3 minutes. He fainted as he was about to take shower after he first got up that morning. He denied any palpitations at that time.  EMS was called and the patient had woken up and felt better.    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 He recently completed chemotherapy for gallbladder cancer. He is s/p cholecystectomy and partial liver resection.  He has known aortic stenosis. His hyperlipidemia is managed on zetia. He no longer smokes. He is not diabetic. He does not have hypertension.   Past Medical History  Diagnosis Date  . Arthritis   . Hyperlipidemia   . History of colon polyps   . Symptomatic PVCs   . COPD (chronic obstructive pulmonary disease) ([place])   . Left hydrocele   . Bilateral carotid artery stenosis     PER DUPLEX 11-17-2012---  RICA 3-61%/  LICA  44-31%  . Mild aortic valve stenosis   . Wears glasses   . Skin abrasion     right temple, left hand areas removed by dermatology last week, areas healing  .  Family history of colon cancer   . Family history of breast cancer   . Hypertension   . Heart murmur   . HOH (hard of hearing)     wears hearing aids  . Allergy   . Skin cancer     basal cell carcinoma  . Cancer ([place]) 06/24/14    gallbladder cancer  . BRCA1 positive     Past Surgical History  Procedure Laterality Date  . Stapedectomy Bilateral 1965 left, 1981 right  . Shoulder surgery Right 2004    rotator cuff  . Transthoracic echocardiogram  03-20-2013  DR WALL    GRADE I DIASTOLIC DYSFUNCTION/  EF 65-70%/  MILD AORTIC STENOSIS WITH STABLE GRADIENTS  . Tonsillectomy  1962  . Hydrocele excision Left 05/10/2013    Procedure: HYDROCELECTOMY ADULT;  Surgeon: Franchot Gallo, MD;  Location: [facility];  Service: Urology;  Laterality: Left;  . Colonoscopy    . Cholecystectomy N/A 06/24/2014    Procedure: LAPAROSCOPIC CHOLECYSTECTOMY;  Surgeon: Coralie Keens, MD;  Location: [place];  Service: General;  Laterality: N/A;  . Open partial hepatectomy [83]  08/01/2014    Procedure: OPEN PARTIAL HEPATECTOMY, INTRA- OPERATIVE ULTRASOUND, COMMON BILE DUCT RESECTION, ROUX-EN-Y HEPATICOJEJUNOSTOMY;  Surgeon: [name], MD;  Location: [facility];  Service: General;;  . Portacath placement N/A 08/26/2014    Procedure: INSERTION PORT-A-CATH;  Surgeon: [name], MD;  Location: [place];  Service:  General;  Laterality: N/A;    Social History   Social History  . Marital Status: Married    Spouse Name: Ronnie Nicholson  . Number of Children: 1  . Years of Education: N/A   Occupational History  . retired     08-2003   Social History Main Topics  . Smoking status: Former Smoker -- 1.00 packs/day for 10 years    Types: Cigarettes    Start date: 06/07/1954    Quit date: 06/18/1983  . Smokeless tobacco: Never Used  . Alcohol Use: No  . Drug Use: No  . Sexual Activity: Not on file   Other Topics Concern  . Not on file   Social History Narrative   Married, wife  Ronnie Nicholson    Family History  Problem Relation Age of Onset  . Colon cancer Father 10  . Colon cancer Brother 59  . Colon cancer Brother   . Breast cancer Sister 53  . Cancer Maternal Uncle     NOS  . Breast cancer Paternal Aunt     dx <50  . Cancer Paternal Uncle     NOS  . Cancer Brother 79    cancer in a gland in his neck  . Brain cancer Brother 18  . Melanoma Brother 20  . Breast cancer Cousin     6 paternal first cousins     Current Outpatient Prescriptions on File Prior to Visit  Medication Sig Dispense Refill  . aspirin EC 81 MG tablet Take 81 mg by mouth daily.    Marland Kitchen ezetimibe (ZETIA) 10 MG tablet Take 1 tablet (10 mg total) by mouth daily. 90 tablet 3  . lidocaine-prilocaine (EMLA) cream Apply small amount of cream over port area 1-2 hours prior to treatment and cover with plastic wrap.  DO NOT RUB IN 30 g PRN  . metoprolol tartrate (LOPRESSOR) 25 MG tablet Take 1 tablet (25 mg total) by mouth daily. 90 tablet 3  . prochlorperazine (COMPAZINE) 10 MG tablet Take 1 tablet (10 mg total) by mouth every 6 (six) hours as needed for nausea. 60 tablet 1   No current facility-administered medications on file prior to visit.    Allergies  Allergen Reactions  . Lodine [Etodolac] Other (See Comments)    URINARY RETENTION  . Flexeril [Cyclobenzaprine Hcl] Rash  . Tetracyclines & Related Rash    REVIEW OF SYSTEMS:  (Positives checked otherwise negative)  CARDIOVASCULAR:  []  chest pain, []  chest pressure, []  palpitations, []  shortness of breath when laying flat, []  shortness of breath with exertion,  []  pain in feet when walking, []  pain in feet when laying flat, []  history of blood clot in veins (DVT), []  history of phlebitis, []  swelling in legs, []  varicose veins  PULMONARY:  []  productive cough, []  asthma, []  wheezing  NEUROLOGIC:  []  weakness in arms or legs, []  numbness in arms or legs, []  difficulty speaking or slurred speech, []  temporary loss of vision in one eye, []   dizziness  HEMATOLOGIC:  []  bleeding problems, []  problems with blood clotting too easily  MUSCULOSKEL:  []  joint pain, []  joint swelling  GASTROINTEST:  []  vomiting blood, []  blood in stool     GENITOURINARY:  []  burning with urination, []  blood in urine  PSYCHIATRIC:  []  history of major depression  INTEGUMENTARY:  []  rashes, []  ulcers  CONSTITUTIONAL:  []  fever, []  chills  For VQI Use Only  PRE-ADM LIVING: Home  AMB STATUS: Ambulatory  CAD Sx: None  PRIOR  CHF: None  STRESS TEST: [ ]  No, [ ]  Normal, [ ]  + ischemia, [ ]  + MI, [ ]  Both  Physical Examination  Filed Vitals:   03/13/15 1427 03/13/15 1428  BP: 138/60 132/75  Pulse: 63   Height: 6' 1"  (1.854 m)   Weight: 178 lb 1.6 oz (80.786 kg)   SpO2: 100%    Body mass index is 23.5 kg/(m^2).  General: A&O x 3, WDWN male in NAD  Head: [place]/AT  Neck: Supple  Pulmonary: Sym exp, good air movt, CTAB, no rales, rhonchi, & wheezing  Cardiac: RRR, systolic murmur best heard at right upper sternal border, bilateral carotid bruits.   Vascular: radial pulses equal and symmetric. Palpable femoral pulses. Palpable left pedal pulse. Trace palpable right pedal pulse.   Gastrointestinal: soft, NTND, non palpable abdominal aorta  Musculoskeletal: M/S 5/5 throughout  Neurologic: CN 2-12 grossly intact  Psychiatric: Judgment intact, Mood & affect appropriate for pt's clinical situation  Dermatologic: See M/S exam for extremity exam, no rashes otherwise noted  Non-Invasive Vascular Imaging  CAROTID DUPLEX (Date: 03/13/2015):   L ICA stenosis: 60-79%  L VA:  patent and antegrade . Medical Decision Making  JOAB CARDEN is a 74 y.o. male who presents with: asymptomatic left carotid stenosis 60-79%   Based on the patient's vascular studies and examination, the patient will follow-up in six months with a carotid duplex.   Explained that the risk of stroke with carotid endarterectomy is higher (1-2%) compared to  medical management and observation.   The patient is on maximal medical management with aspirin and zetia. He is not a smoker.   Discussed that if he were to have another syncopal episode or experience palpitations, to contact Dr. Bronson Ing.   Virgina Jock, PA-C [facility] Office: [phone] Pager: [phone]  03/13/2015, 3:15 PM  This patient was seen in conjunction with Dr. Oneida Alar.     History and exam findings as above. The patient has most likely a 60-80% left internal carotid artery stenosis which probably leans more towards the mid range of this. He is currently asymptomatic. His end-diastolic velocity was 968 cm/s. He is currently on antiplatelets and lipid-lowering therapy. I believe the best option at this point is continued carotid surveillance with a repeat duplex exam in 6 months. If he develops symptoms or progresses to greater than 80% stenosis we would consider carotid endarterectomy at that time.  The patient did have a syncopal episode recently. I discussed with him that most likely this is not related to his carotid disease. However, this can be related to cardiac arrhythmia. I discussed with him that if he has any further episodes similar to this or feelings of his heart racing or fluttering he should contact his cardiologist.  Ruta Hinds, MD [facility]: [phone] Pager: [phone]

## 2015-03-13 NOTE — Telephone Encounter (Signed)
Refill complete 

## 2015-03-19 ENCOUNTER — Telehealth: Payer: Self-pay | Admitting: Oncology

## 2015-03-19 ENCOUNTER — Ambulatory Visit (HOSPITAL_BASED_OUTPATIENT_CLINIC_OR_DEPARTMENT_OTHER): Payer: Medicare Other | Admitting: Oncology

## 2015-03-19 ENCOUNTER — Ambulatory Visit (HOSPITAL_BASED_OUTPATIENT_CLINIC_OR_DEPARTMENT_OTHER): Payer: Medicare Other

## 2015-03-19 ENCOUNTER — Other Ambulatory Visit (HOSPITAL_BASED_OUTPATIENT_CLINIC_OR_DEPARTMENT_OTHER): Payer: Medicare Other

## 2015-03-19 VITALS — BP 136/65 | HR 69 | Temp 98.0°F | Resp 20 | Ht 73.0 in | Wt 179.1 lb

## 2015-03-19 DIAGNOSIS — C23 Malignant neoplasm of gallbladder: Secondary | ICD-10-CM | POA: Diagnosis present

## 2015-03-19 DIAGNOSIS — I6523 Occlusion and stenosis of bilateral carotid arteries: Secondary | ICD-10-CM

## 2015-03-19 DIAGNOSIS — Z95828 Presence of other vascular implants and grafts: Secondary | ICD-10-CM

## 2015-03-19 LAB — CBC WITH DIFFERENTIAL/PLATELET
BASO%: 0.7 % (ref 0.0–2.0)
Basophils Absolute: 0 10*3/uL (ref 0.0–0.1)
EOS ABS: 0.1 10*3/uL (ref 0.0–0.5)
EOS%: 2.2 % (ref 0.0–7.0)
HEMATOCRIT: 32.8 % — AB (ref 38.4–49.9)
HEMOGLOBIN: 10.8 g/dL — AB (ref 13.0–17.1)
LYMPH#: 0.5 10*3/uL — AB (ref 0.9–3.3)
LYMPH%: 8.7 % — AB (ref 14.0–49.0)
MCH: 31.8 pg (ref 27.2–33.4)
MCHC: 32.8 g/dL (ref 32.0–36.0)
MCV: 97 fL (ref 79.3–98.0)
MONO#: 0.7 10*3/uL (ref 0.1–0.9)
MONO%: 12.3 % (ref 0.0–14.0)
NEUT%: 76.1 % — ABNORMAL HIGH (ref 39.0–75.0)
NEUTROS ABS: 4.3 10*3/uL (ref 1.5–6.5)
PLATELETS: 206 10*3/uL (ref 140–400)
RBC: 3.38 10*6/uL — AB (ref 4.20–5.82)
RDW: 15.3 % — ABNORMAL HIGH (ref 11.0–14.6)
WBC: 5.6 10*3/uL (ref 4.0–10.3)

## 2015-03-19 LAB — COMPREHENSIVE METABOLIC PANEL (CC13)
ALBUMIN: 3.1 g/dL — AB (ref 3.5–5.0)
ALK PHOS: 554 U/L — AB (ref 40–150)
ALT: 45 U/L (ref 0–55)
ANION GAP: 7 meq/L (ref 3–11)
AST: 46 U/L — ABNORMAL HIGH (ref 5–34)
BILIRUBIN TOTAL: 0.65 mg/dL (ref 0.20–1.20)
BUN: 11.7 mg/dL (ref 7.0–26.0)
CALCIUM: 8.8 mg/dL (ref 8.4–10.4)
CO2: 26 mEq/L (ref 22–29)
CREATININE: 0.8 mg/dL (ref 0.7–1.3)
Chloride: 107 mEq/L (ref 98–109)
EGFR: 87 mL/min/{1.73_m2} — AB (ref >90)
Glucose: 96 mg/dl (ref 70–140)
Potassium: 4 mEq/L (ref 3.5–5.1)
Sodium: 140 mEq/L (ref 136–145)
TOTAL PROTEIN: 6.4 g/dL (ref 6.4–8.3)

## 2015-03-19 MED ORDER — HEPARIN SOD (PORK) LOCK FLUSH 100 UNIT/ML IV SOLN
500.0000 [IU] | Freq: Once | INTRAVENOUS | Status: AC
  Administered 2015-03-19: 500 [IU] via INTRAVENOUS
  Filled 2015-03-19: qty 5

## 2015-03-19 MED ORDER — SODIUM CHLORIDE 0.9 % IJ SOLN
10.0000 mL | INTRAMUSCULAR | Status: DC | PRN
  Administered 2015-03-19: 10 mL via INTRAVENOUS
  Filled 2015-03-19: qty 10

## 2015-03-19 NOTE — Progress Notes (Signed)
  [place] OFFICE PROGRESS NOTE   Diagnosis: Gallbladder carcinoma  INTERVAL HISTORY:   Mr. Flammer returns as scheduled. He completed a final treatment with gemcitabine 02/27/2015. He feels well. Good appetite. He has intermittent abdominal discomfort that is relieved with eating. He had an episode of "vertigo "when picking up sticks out of his chart earlier this week. This resolved over a few minutes.   Objective:  Vital signs in last 24 hours:  Blood pressure 136/65, pulse 69, temperature 98 F (36.7 C), temperature source Oral, resp. rate 20, height $RemoveBe'6\' 1"'vwGiAEUaP$  (1.854 m), weight 179 lb 1.6 oz (81.239 kg), SpO2 100 %.    HEENT: Neck without mass Lymphatics: No cervical, supraclavicular, axillary, or inguinal nodes Resp: Lungs with bronchial sounds at the upper posterior chest bilaterally, no respiratory distress Cardio: Regular rate and rhythm GI: No hepatomegaly, nontender, no mass Vascular: No leg edema   Portacath/PICC-without erythema  Lab Results:  Lab Results  Component Value Date   WBC 5.6 03/19/2015   HGB 10.8* 03/19/2015   HCT 32.8* 03/19/2015   MCV 97.0 03/19/2015   PLT 206 03/19/2015   NEUTROABS 4.3 03/19/2015   AST 46, ALT 45, alkaline phosphatase 554  CA 19-9 on 02/27/2015-63.2 Medications: I have reviewed the patient's current medications.  Assessment/Plan: 1. 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, M0), status post a cholecystectomy 06/24/2014 with a 2.5 cm gallbladder mucosal mass with extension into attached hepatic parenchyma, positive hepatic parenchymal and cystic duct margins, diffuse lymphovascular invasion  Elevated CA 19-9  Partial liver resection, bile duct resection, lymph node resection on 08/01/2014 with the pathology confirming residual invasive adenocarcinoma in the liver-negative margins, metastatic adenocarcinoma with extranodal extension involving one lymph node  Final pathologic  stage IIIB (pT3,pN1)  Initiation of adjuvant gemcitabine 09/05/2014 (09/05/2014, 09/12/2014, 09/19/2014)  Initiation of adjuvant radiation/Xeloda 10/07/2014; completed 11/11/2014  Resumption of adjuvant gemcitabine 11/28/2014, completed 02/27/2015  2. Intermittent right upper abdominal pain beginning in the summer of 2015-likely related to symptomatic cholelithiasis. Improved.  3. History of colon polyps  4. Family history of multiple cancers including colon cancer, status post a genetics evaluation. Positive for the BRCA1 mutation.  5. Elevated liver enzymes 09/07/2014; improved     Disposition:  Ronnie Nicholson is in clinical remission from the gallbladder cancer. The CA 19-9 remains mildly elevated. This could represent the presence of subclinical tumor or a nonspecific finding. He will return for an office visit and repeat CA 19-9 in 6 weeks. We decided to leave the Port-A-Cath in place for now. He will contact us for new symptoms in the interim.  Betsy Coder, MD  03/19/2015  12:37 PM

## 2015-03-19 NOTE — Patient Instructions (Signed)

## 2015-03-19 NOTE — Telephone Encounter (Signed)
lvm for pt advised on NOV appt

## 2015-04-04 ENCOUNTER — Other Ambulatory Visit: Payer: Medicare Other

## 2015-05-05 ENCOUNTER — Ambulatory Visit (HOSPITAL_BASED_OUTPATIENT_CLINIC_OR_DEPARTMENT_OTHER): Payer: Medicare Other

## 2015-05-05 ENCOUNTER — Telehealth: Payer: Self-pay | Admitting: Oncology

## 2015-05-05 ENCOUNTER — Ambulatory Visit (HOSPITAL_BASED_OUTPATIENT_CLINIC_OR_DEPARTMENT_OTHER): Payer: Medicare Other | Admitting: Oncology

## 2015-05-05 ENCOUNTER — Other Ambulatory Visit (HOSPITAL_BASED_OUTPATIENT_CLINIC_OR_DEPARTMENT_OTHER): Payer: Medicare Other

## 2015-05-05 VITALS — BP 123/56 | HR 72 | Temp 98.2°F | Resp 18 | Wt 179.6 lb

## 2015-05-05 DIAGNOSIS — Z1509 Genetic susceptibility to other malignant neoplasm: Secondary | ICD-10-CM

## 2015-05-05 DIAGNOSIS — I6523 Occlusion and stenosis of bilateral carotid arteries: Secondary | ICD-10-CM | POA: Diagnosis not present

## 2015-05-05 DIAGNOSIS — C787 Secondary malignant neoplasm of liver and intrahepatic bile duct: Secondary | ICD-10-CM

## 2015-05-05 DIAGNOSIS — C23 Malignant neoplasm of gallbladder: Secondary | ICD-10-CM | POA: Diagnosis present

## 2015-05-05 DIAGNOSIS — Z95828 Presence of other vascular implants and grafts: Secondary | ICD-10-CM

## 2015-05-05 LAB — COMPREHENSIVE METABOLIC PANEL (CC13)
ALT: 63 U/L — AB (ref 0–55)
ANION GAP: 7 meq/L (ref 3–11)
AST: 72 U/L — ABNORMAL HIGH (ref 5–34)
Albumin: 3.1 g/dL — ABNORMAL LOW (ref 3.5–5.0)
Alkaline Phosphatase: 578 U/L — ABNORMAL HIGH (ref 40–150)
BUN: 14.3 mg/dL (ref 7.0–26.0)
CALCIUM: 8.9 mg/dL (ref 8.4–10.4)
CHLORIDE: 105 meq/L (ref 98–109)
CO2: 26 meq/L (ref 22–29)
CREATININE: 0.8 mg/dL (ref 0.7–1.3)
EGFR: 85 mL/min/{1.73_m2} — ABNORMAL LOW (ref >90)
Glucose: 63 mg/dl — ABNORMAL LOW (ref 70–140)
POTASSIUM: 4 meq/L (ref 3.5–5.1)
Sodium: 138 mEq/L (ref 136–145)
Total Bilirubin: 0.79 mg/dL (ref 0.20–1.20)
Total Protein: 6.9 g/dL (ref 6.4–8.3)

## 2015-05-05 LAB — CBC WITH DIFFERENTIAL/PLATELET
BASO%: 0.5 % (ref 0.0–2.0)
BASOS ABS: 0 10*3/uL (ref 0.0–0.1)
EOS%: 1.9 % (ref 0.0–7.0)
Eosinophils Absolute: 0.1 10*3/uL (ref 0.0–0.5)
HCT: 36.8 % — ABNORMAL LOW (ref 38.4–49.9)
HGB: 12 g/dL — ABNORMAL LOW (ref 13.0–17.1)
LYMPH%: 10.2 % — AB (ref 14.0–49.0)
MCH: 31.1 pg (ref 27.2–33.4)
MCHC: 32.6 g/dL (ref 32.0–36.0)
MCV: 95.3 fL (ref 79.3–98.0)
MONO#: 0.7 10*3/uL (ref 0.1–0.9)
MONO%: 11 % (ref 0.0–14.0)
NEUT#: 4.9 10*3/uL (ref 1.5–6.5)
NEUT%: 76.4 % — AB (ref 39.0–75.0)
PLATELETS: 165 10*3/uL (ref 140–400)
RBC: 3.86 10*6/uL — AB (ref 4.20–5.82)
RDW: 13.8 % (ref 11.0–14.6)
WBC: 6.5 10*3/uL (ref 4.0–10.3)
lymph#: 0.7 10*3/uL — ABNORMAL LOW (ref 0.9–3.3)

## 2015-05-05 MED ORDER — HEPARIN SOD (PORK) LOCK FLUSH 100 UNIT/ML IV SOLN
500.0000 [IU] | Freq: Once | INTRAVENOUS | Status: AC
  Administered 2015-05-05: 500 [IU] via INTRAVENOUS
  Filled 2015-05-05: qty 5

## 2015-05-05 MED ORDER — SODIUM CHLORIDE 0.9 % IJ SOLN
10.0000 mL | INTRAMUSCULAR | Status: DC | PRN
  Filled 2015-05-05: qty 10

## 2015-05-05 NOTE — Patient Instructions (Signed)

## 2015-05-05 NOTE — Telephone Encounter (Signed)
per pof to sch pt appt-gave pt copy of avs °

## 2015-05-05 NOTE — Progress Notes (Signed)
  Ronnie Nicholson   Diagnosis: Gallbladder carcinoma  INTERVAL HISTORY:   Ronnie Nicholson returns as scheduled. Good appetite. He is working at home. He has intermittent upper abdominal pain lasting seconds. This has improved compared to several months ago. No nausea or vomiting. Increased "gas and belching ".  Objective:  Vital signs in last 24 hours:  There were no vitals taken for this visit.    HEENT: Neck without mass Lymphatics: No cervical, supraclavicular, or axillary nodes Resp: Lungs clear bilaterally Cardio: Regular rate and rhythm, 2/6 systolic murmur GI: No hepatomegaly, nontender, no mass, no apparent ascites Vascular: No leg edema  Portacath/PICC-without erythema  Lab Results:  Lab Results  Component Value Date   WBC 6.5 05/05/2015   HGB 12.0* 05/05/2015   HCT 36.8* 05/05/2015   MCV 95.3 05/05/2015   PLT 165 05/05/2015   NEUTROABS 4.9 05/05/2015   Alkaline phosphatase 578, albumin 3.1, AST 72, ALT 63, bilirubin 0.79   02/27/2015: CA 19-9    63.2    Medications: I have reviewed the patient's current medications.  Assessment/Plan: 1.  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, M0), status post a cholecystectomy 06/24/2014 with a 2.5 cm gallbladder mucosal mass with extension into attached hepatic parenchyma, positive hepatic parenchymal and cystic duct margins, diffuse lymphovascular invasion  Elevated CA 19-9  Partial liver resection, bile duct resection, lymph node resection on 08/01/2014 with the pathology confirming residual invasive adenocarcinoma in the liver-negative margins, metastatic adenocarcinoma with extranodal extension involving one lymph node  Final pathologic stage IIIB (pT3,pN1)  Initiation of adjuvant gemcitabine 09/05/2014 (09/05/2014, 09/12/2014, 09/19/2014)  Initiation of adjuvant radiation/Xeloda 10/07/2014; completed 11/11/2014  Resumption of adjuvant gemcitabine  11/28/2014, completed 02/27/2015  2. Intermittent right upper abdominal pain beginning in the summer of 2015-likely related to symptomatic cholelithiasis. Improved.  3. History of colon polyps  4. Family history of multiple cancers including colon cancer, status post a genetics evaluation. Positive for the BRCA1 mutation.  5. Elevated liver enzymes 09/07/2014; improved     Disposition:  Ronnie Nicholson remains in clinical remission from gallbladder carcinoma. The liver enzymes have been elevated throughout this year. This is likely unrelated to the gallbladder carcinoma. I have a low clinical suspicion for progression of the gallbladder carcinoma. We will follow-up on the CA 19-9 today. Ronnie Nicholson is asking whether he should have a CT to follow-up on the biliary stent and elevated liver enzymes. I do not see a clear indication for CT. I will ask Dr. Barry Dienes.  Ronnie Nicholson will return for an office/lab visit and Port-A-Cath flush in 6 weeks.  Betsy Coder, MD  05/05/2015  12:03 PM

## 2015-05-06 LAB — CANCER ANTIGEN 19-9: CA 19-9: 169.9 U/mL — ABNORMAL HIGH (ref <35.0)

## 2015-05-07 ENCOUNTER — Telehealth: Payer: Self-pay | Admitting: *Deleted

## 2015-05-07 DIAGNOSIS — C23 Malignant neoplasm of gallbladder: Secondary | ICD-10-CM

## 2015-05-07 NOTE — Telephone Encounter (Signed)
Per Dr. Benay Spice; notified pt that ca19-9 is higher, does not mean cancer is definitely progressing; will schedule CT chest/abd/pelvis in the next 2 weeks and call results to him; also Dr. Benay Spice has discussed with Dr. Barry Dienes.  Pt verbalized understanding of all information and expressed appreciation for call.

## 2015-05-07 NOTE — Telephone Encounter (Signed)
Sent inbox to Amy to call with result and schedule CT

## 2015-05-07 NOTE — Telephone Encounter (Signed)
"  Ronnie Nicholson saw Dr.Sherrill Monday.  We were told the CA 19.9 would be on MyChart Tuesday and it isn't."    Advised results will automatically be released 05-12-2015 per the MyChart release results tab.  Advised results are not released until 7 to 10 business after resulted unless provider releases results.  Will notify Dr. Benay Spice for any instructions or orders.

## 2015-05-07 NOTE — Telephone Encounter (Signed)
-----   Message from Ladell Pier, MD sent at 05/07/2015  3:53 PM EST ----- Please call patient, ca19-9 higher, does not mean cancer is definitely progressing Schedule CTs chest/abd/pelvis next 2 weeks We will call with result and schedule to see him as needed Discussed with Dr. Barry Dienes

## 2015-05-08 NOTE — Telephone Encounter (Signed)
Already completed; see encounter note 05/07/15

## 2015-05-22 ENCOUNTER — Ambulatory Visit (HOSPITAL_COMMUNITY)
Admission: RE | Admit: 2015-05-22 | Discharge: 2015-05-22 | Disposition: A | Discharge status: Home / Self Care | Payer: Medicare Other | Source: Ambulatory Visit | Attending: Oncology | Admitting: Oncology

## 2015-05-22 DIAGNOSIS — I6522 Occlusion and stenosis of left carotid artery: Secondary | ICD-10-CM | POA: Diagnosis not present

## 2015-05-22 DIAGNOSIS — Z9221 Personal history of antineoplastic chemotherapy: Secondary | ICD-10-CM | POA: Insufficient documentation

## 2015-05-22 DIAGNOSIS — N281 Cyst of kidney, acquired: Secondary | ICD-10-CM | POA: Diagnosis not present

## 2015-05-22 DIAGNOSIS — Z923 Personal history of irradiation: Secondary | ICD-10-CM | POA: Diagnosis not present

## 2015-05-22 DIAGNOSIS — I700 Atherosclerosis of aorta: Secondary | ICD-10-CM | POA: Diagnosis not present

## 2015-05-22 DIAGNOSIS — C23 Malignant neoplasm of gallbladder: Secondary | ICD-10-CM | POA: Insufficient documentation

## 2015-05-22 DIAGNOSIS — R918 Other nonspecific abnormal finding of lung field: Secondary | ICD-10-CM | POA: Diagnosis not present

## 2015-05-22 DIAGNOSIS — K769 Liver disease, unspecified: Secondary | ICD-10-CM | POA: Diagnosis not present

## 2015-05-22 DIAGNOSIS — I77810 Thoracic aortic ectasia: Secondary | ICD-10-CM | POA: Diagnosis not present

## 2015-05-22 MED ORDER — IOHEXOL 300 MG/ML  SOLN
100.0000 mL | Freq: Once | INTRAMUSCULAR | Status: AC | PRN
  Administered 2015-05-22: 100 mL via INTRAVENOUS

## 2015-05-23 ENCOUNTER — Telehealth: Payer: Self-pay | Admitting: *Deleted

## 2015-05-23 NOTE — Telephone Encounter (Signed)
-----   Message from Ladell Pier, MD sent at 05/22/2015  7:25 PM EST ----- Please call patient, cts negative for cancer except for small indeterminate area in dome of liver that could be a metastasis, MRI liver with and without contrast recommend Please schedule prior to 1/10 appt. Can schedule on open MRI at [address]

## 2015-05-26 ENCOUNTER — Telehealth: Payer: Self-pay | Admitting: *Deleted

## 2015-05-26 DIAGNOSIS — C23 Malignant neoplasm of gallbladder: Secondary | ICD-10-CM

## 2015-05-26 NOTE — Telephone Encounter (Signed)
-----   Message from Ladell Pier, MD sent at 05/22/2015  7:25 PM EST ----- Please call patient, cts negative for cancer except for small indeterminate area in dome of liver that could be a metastasis, MRI liver with and without contrast recommend Please schedule prior to 1/10 appt. Can schedule on open MRI at [address]

## 2015-05-26 NOTE — Telephone Encounter (Signed)
Per Dr. Benay Spice; notified pt that CT negative for cancer except for small indeterminate area in dome of liver that could be a metastasis.  Dr. Benay Spice recommends scheduling an MRI of the liver for further evaluation.  Pt verbalized understanding of information and will wait to hear from schedulers re: MRI.

## 2015-05-27 ENCOUNTER — Telehealth: Payer: Self-pay | Admitting: Oncology

## 2015-05-27 NOTE — Telephone Encounter (Signed)
[facility] scheduling will contact patient re mri to be done @ [facility]. No other orders per 12/19 pof.

## 2015-05-27 NOTE — Telephone Encounter (Signed)
Wife called and stated that after talking with the MRI schedulers it was determined that pt cannot go into MRI machine. He has a stainless steel wire in his left ear. Carrie in radiology was to f/u with Dr Benay Spice on what to do. Wife is asking what's next? His next appt is 1/10

## 2015-05-28 ENCOUNTER — Other Ambulatory Visit: Payer: Self-pay | Admitting: Oncology

## 2015-05-28 ENCOUNTER — Telehealth: Payer: Self-pay | Admitting: Oncology

## 2015-05-28 DIAGNOSIS — C23 Malignant neoplasm of gallbladder: Secondary | ICD-10-CM

## 2015-05-28 NOTE — Telephone Encounter (Signed)
Per Dr. Benay Spice; left voice message re: update; since pt not able to get MRI, Dr. Benay Spice is ordering an [place] and scheduling will call with date/time of appt; call office if any questions.

## 2015-05-28 NOTE — Telephone Encounter (Signed)
[facility] SCHED[facility] WILL CALL PATIENT RE [place] APPOINTMENT. NO OTHER ORDERS PER 12/21 POF.

## 2015-06-03 ENCOUNTER — Ambulatory Visit (HOSPITAL_COMMUNITY)
Admission: RE | Admit: 2015-06-03 | Discharge: 2015-06-03 | Disposition: A | Discharge status: Home / Self Care | Payer: Medicare Other | Source: Ambulatory Visit | Attending: Oncology | Admitting: Oncology

## 2015-06-03 DIAGNOSIS — R188 Other ascites: Secondary | ICD-10-CM | POA: Diagnosis not present

## 2015-06-03 DIAGNOSIS — Z9049 Acquired absence of other specified parts of digestive tract: Secondary | ICD-10-CM | POA: Diagnosis not present

## 2015-06-03 DIAGNOSIS — C23 Malignant neoplasm of gallbladder: Secondary | ICD-10-CM

## 2015-06-10 ENCOUNTER — Telehealth: Payer: Self-pay | Admitting: *Deleted

## 2015-06-10 NOTE — Telephone Encounter (Signed)
Received message from pt wife asking "what's the plan"  Per Dr. Benay Spice; notified pt that will watch for now (after abdomen US) and plan is to repeat CT abd in 3-4 month; keep appt on 1/10 to f/u with any questions.  Pt verbalized understanding and expressed appreciation for call.

## 2015-06-12 LAB — LIPID PANEL
CHOL/HDL RATIO: 2.8 ratio (ref <=5.0)
CHOLESTEROL: 218 mg/dL — AB (ref 125–200)
HDL: 77 mg/dL (ref >=40)
LDL Cholesterol: 131 mg/dL — ABNORMAL HIGH (ref <130)
TRIGLYCERIDES: 48 mg/dL (ref <150)
VLDL: 10 mg/dL (ref <30)

## 2015-06-17 ENCOUNTER — Telehealth: Payer: Self-pay | Admitting: Oncology

## 2015-06-17 ENCOUNTER — Other Ambulatory Visit (HOSPITAL_BASED_OUTPATIENT_CLINIC_OR_DEPARTMENT_OTHER): Payer: Medicare Other

## 2015-06-17 ENCOUNTER — Telehealth: Payer: Self-pay | Admitting: *Deleted

## 2015-06-17 ENCOUNTER — Ambulatory Visit (HOSPITAL_BASED_OUTPATIENT_CLINIC_OR_DEPARTMENT_OTHER): Payer: Medicare Other

## 2015-06-17 ENCOUNTER — Ambulatory Visit (HOSPITAL_BASED_OUTPATIENT_CLINIC_OR_DEPARTMENT_OTHER): Payer: Medicare Other | Admitting: Oncology

## 2015-06-17 VITALS — BP 141/61 | HR 72 | Temp 97.9°F | Resp 18 | Ht 73.0 in | Wt 177.0 lb

## 2015-06-17 DIAGNOSIS — C23 Malignant neoplasm of gallbladder: Secondary | ICD-10-CM

## 2015-06-17 DIAGNOSIS — K769 Liver disease, unspecified: Secondary | ICD-10-CM

## 2015-06-17 LAB — CBC WITH DIFFERENTIAL/PLATELET
BASO%: 0.4 % (ref 0.0–2.0)
Basophils Absolute: 0 10*3/uL (ref 0.0–0.1)
EOS ABS: 0.1 10*3/uL (ref 0.0–0.5)
EOS%: 1.1 % (ref 0.0–7.0)
HEMATOCRIT: 36.4 % — AB (ref 38.4–49.9)
HGB: 12 g/dL — ABNORMAL LOW (ref 13.0–17.1)
LYMPH%: 9.3 % — AB (ref 14.0–49.0)
MCH: 30.8 pg (ref 27.2–33.4)
MCHC: 33 g/dL (ref 32.0–36.0)
MCV: 93.6 fL (ref 79.3–98.0)
MONO#: 0.7 10*3/uL (ref 0.1–0.9)
MONO%: 13.6 % (ref 0.0–14.0)
NEUT%: 75.6 % — ABNORMAL HIGH (ref 39.0–75.0)
NEUTROS ABS: 4.1 10*3/uL (ref 1.5–6.5)
PLATELETS: 178 10*3/uL (ref 140–400)
RBC: 3.89 10*6/uL — AB (ref 4.20–5.82)
RDW: 14.1 % (ref 11.0–14.6)
WBC: 5.4 10*3/uL (ref 4.0–10.3)
lymph#: 0.5 10*3/uL — ABNORMAL LOW (ref 0.9–3.3)

## 2015-06-17 LAB — COMPREHENSIVE METABOLIC PANEL
ALBUMIN: 3.1 g/dL — AB (ref 3.5–5.0)
ALK PHOS: 720 U/L — AB (ref 40–150)
ALT: 68 U/L — ABNORMAL HIGH (ref 0–55)
ANION GAP: 8 meq/L (ref 3–11)
AST: 60 U/L — ABNORMAL HIGH (ref 5–34)
BILIRUBIN TOTAL: 0.93 mg/dL (ref 0.20–1.20)
BUN: 13.6 mg/dL (ref 7.0–26.0)
CALCIUM: 8.9 mg/dL (ref 8.4–10.4)
CO2: 26 mEq/L (ref 22–29)
Chloride: 105 mEq/L (ref 98–109)
Creatinine: 0.8 mg/dL (ref 0.7–1.3)
EGFR: 85 mL/min/{1.73_m2} — AB (ref >90)
Glucose: 69 mg/dl — ABNORMAL LOW (ref 70–140)
Potassium: 4.3 mEq/L (ref 3.5–5.1)
Sodium: 138 mEq/L (ref 136–145)
TOTAL PROTEIN: 7 g/dL (ref 6.4–8.3)

## 2015-06-17 MED ORDER — HEPARIN SOD (PORK) LOCK FLUSH 100 UNIT/ML IV SOLN
500.0000 [IU] | Freq: Once | INTRAVENOUS | Status: AC
  Administered 2015-06-17: 500 [IU] via INTRAVENOUS
  Filled 2015-06-17: qty 5

## 2015-06-17 MED ORDER — SODIUM CHLORIDE 0.9 % IJ SOLN
10.0000 mL | Freq: Once | INTRAMUSCULAR | Status: AC
  Administered 2015-06-17: 10 mL via INTRAVENOUS
  Filled 2015-06-17: qty 10

## 2015-06-17 NOTE — Telephone Encounter (Signed)
Lt mess for nurse pt informed me that he would not be able to complete a MRI.

## 2015-06-17 NOTE — Telephone Encounter (Addendum)
Spoke with pt's wife. They will come in early for office visit.

## 2015-06-17 NOTE — Progress Notes (Signed)
[place] OFFICE PROGRESS NOTE   Diagnosis: Gallbladder cancer  INTERVAL HISTORY:   Mr. Rodriquez returns as scheduled. He has a good appetite. He has not gained weight. His wife is concerned with the lack of weight gain despite him being less active than in the past. Intermittent mild upper abdominal discomfort.  CTs of the abdomen and pelvis on 05/22/2015 revealed a vague low attenuation lesion in segment 4 of the liver concerning for metastasis. There is a small amount of free abdominal and pelvic fluid. An MRI was recommended, but he could not have this study secondary to an ear implant.  An abdominal ultrasound on 06/03/2015 revealed a 1.7 cm hypoechoic focus in the central liver possibly corresponding to the CT finding.   Objective:  Vital signs in last 24 hours:  Blood pressure 141/61, pulse 72, temperature 97.9 F (36.6 C), temperature source Oral, resp. rate 18, height _0  (1.854 m), weight 177 lb (80.287 kg), SpO2 100 %.    HEENT: Neck without mass Lymphatics: No cervical, supra-clavicular, axillary, or inguinal nodes Resp: Lungs clear bilaterally Cardio: Regular rate and rhythm GI: No mass, no hepatomegaly, nontender, no apparent ascites Vascular: No leg edema   Portacath/PICC-without erythema  Lab Results:  Lab Results  Component Value Date   WBC 5.4 06/17/2015   HGB 12.0* 06/17/2015   HCT 36.4* 06/17/2015   MCV 93.6 06/17/2015   PLT 178 06/17/2015   NEUTROABS 4.1 06/17/2015   05/05/2015: CA 19-9-169.9  Medications: I have reviewed the patient's current medications.  Assessment/Plan: 1. 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, M0), status post a cholecystectomy 06/24/2014 with a 2.5 cm gallbladder mucosal mass with extension into attached hepatic parenchyma, positive hepatic parenchymal and cystic duct margins, diffuse lymphovascular invasion  Elevated CA 19-9  Partial liver resection, bile duct  resection, lymph node resection on 08/01/2014 with the pathology confirming residual invasive adenocarcinoma in the liver-negative margins, metastatic adenocarcinoma with extranodal extension involving one lymph node  Final pathologic stage IIIB (pT3,pN1)  Initiation of adjuvant gemcitabine 09/05/2014 (09/05/2014, 09/12/2014, 09/19/2014)  Initiation of adjuvant radiation/Xeloda 10/07/2014; completed 11/11/2014  Resumption of adjuvant gemcitabine 11/28/2014, completed 02/27/2015  Elevated CA 19-9 05/05/2015  CT 05/22/2015 with a vague hypoattenuating lesion in segment 4 of the liver  Ultrasound 06/03/2015 revealed a hypoechoic focus in the central liver, possibly corresponding to the CT finding from 05/22/2015  2. Intermittent right upper abdominal pain beginning in the summer of 2015-likely related to symptomatic cholelithiasis. Improved.  3. History of colon polyps  4. Family history of multiple cancers including colon cancer, status post a genetics evaluation. Positive for the BRCA1 mutation.  5. Elevated liver enzymes 09/07/2014; persistent    Disposition:  Mr. Angelino has a history of adenocarcinoma the gallbladder. I discussed the recent imaging findings with Mr. Massie and his wife. His case was presented at the GI tumor conference last month. It is possible the liver lesion seen on CT and ultrasound represents recurrent gallbladder cancer. The consensus recommendation from the conference is to obtain a follow-up CT to look for evidence of a definite liver lesion and other sites of metastatic disease.  Mr. Rothman agrees to a follow-up CT of the abdomen 07/31/2015. We will consider a PET scan or biopsy pending the CT findings. He is scheduled for an office visit to 08/01/2015.  We will follow-up on the CA 19-9 from today and he will return for an office visit 08/01/2015.  Betsy Coder, MD  06/17/2015  1:14 PM

## 2015-06-18 ENCOUNTER — Telehealth: Payer: Self-pay | Admitting: *Deleted

## 2015-06-18 LAB — CANCER ANTIGEN 19-9: CA 19-9: 134 U/mL — ABNORMAL HIGH (ref 0–35)

## 2015-06-18 LAB — CANCER ANTIGEN 19-9 (PARALLEL TESTING): CA 19-9: 88.8 U/mL — ABNORMAL HIGH (ref <35.0)

## 2015-06-18 NOTE — Telephone Encounter (Signed)
Per Dr. Benay Spice; notified pt's wife that ca19-9 is slightly better, explained lab change [facility] to [facility].  Pt's wife verbalized understanding and expressed appreciation for call.

## 2015-06-18 NOTE — Telephone Encounter (Signed)
Patient given appointments prior to leaving yesterday. [facility] will call patient re ct scan.

## 2015-06-18 NOTE — Telephone Encounter (Signed)
-----   Message from Ladell Pier, MD sent at 06/18/2015  7:26 AM EST ----- Please call patient, ca19-9 stable to slightly better, 134 by old method for comparison

## 2015-07-08 ENCOUNTER — Ambulatory Visit (HOSPITAL_BASED_OUTPATIENT_CLINIC_OR_DEPARTMENT_OTHER): Payer: Medicare Other | Admitting: Oncology

## 2015-07-08 ENCOUNTER — Telehealth: Payer: Self-pay | Admitting: *Deleted

## 2015-07-08 ENCOUNTER — Ambulatory Visit (HOSPITAL_COMMUNITY)
Admission: RE | Admit: 2015-07-08 | Discharge: 2015-07-08 | Disposition: A | Discharge status: Home / Self Care | Payer: Medicare Other | Source: Ambulatory Visit | Attending: Oncology | Admitting: Oncology

## 2015-07-08 ENCOUNTER — Telehealth: Payer: Self-pay | Admitting: Oncology

## 2015-07-08 VITALS — BP 129/66 | HR 64 | Temp 97.8°F | Resp 18 | Ht 73.0 in | Wt 175.5 lb

## 2015-07-08 DIAGNOSIS — C23 Malignant neoplasm of gallbladder: Secondary | ICD-10-CM

## 2015-07-08 DIAGNOSIS — R109 Unspecified abdominal pain: Secondary | ICD-10-CM | POA: Diagnosis present

## 2015-07-08 DIAGNOSIS — R140 Abdominal distension (gaseous): Secondary | ICD-10-CM | POA: Insufficient documentation

## 2015-07-08 DIAGNOSIS — Z9889 Other specified postprocedural states: Secondary | ICD-10-CM | POA: Diagnosis not present

## 2015-07-08 NOTE — Telephone Encounter (Signed)
Message from pt's wife reporting he is having more pain, decreased appetite. Requesting to see MD. Reviewed with Dr. Benay Spice. Pt will be worked in this afternoon. Order to schedulers.

## 2015-07-08 NOTE — Progress Notes (Signed)
  Ronnie Nicholson OFFICE PROGRESS NOTE   Diagnosis: Gallbladder cancer  INTERVAL HISTORY:   Ronnie Nicholson returns prior to a scheduled visit. He reports increased pain at the mid abdomen and back for the past few weeks. The pain is intermittent and lasts for a few hours. He has frequent belching. He reports altered taste but has an adequate intake of food. His bowels are moving. No vomiting. The abdomen has been mildly distended. No neurologic symptoms.  Objective:  Vital signs in last 24 hours:  Blood pressure 129/66, pulse 64, temperature 97.8 F (36.6 C), temperature source Oral, resp. rate 18, height '6\' 1"'$  (1.854 m), weight 175 lb 8 oz (79.606 kg), SpO2 100 %.   Resp: Lungs clear bilaterally, distant breath sounds Cardio: Regular rate and rhythm GI: No hepatosplenomegaly, no mass, mildly distended, nontender, no apparent ascites Vascular: No leg edema Musculoskeletal: No spine tenderness, the area of pain is at the mid back lateral to the spine bilaterally   Portacath/PICC-without erythema    Medications: I have reviewed the patient's current medications.  Assessment/Plan:  1. 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, M0), status post a cholecystectomy 06/24/2014 with a 2.5 cm gallbladder mucosal mass with extension into attached hepatic parenchyma, positive hepatic parenchymal and cystic duct margins, diffuse lymphovascular invasion  Elevated CA 19-9  Partial liver resection, bile duct resection, lymph node resection on 08/01/2014 with the pathology confirming residual invasive adenocarcinoma in the liver-negative margins, metastatic adenocarcinoma with extranodal extension involving one lymph node  Final pathologic stage IIIB (pT3,pN1)  Initiation of adjuvant gemcitabine 09/05/2014 (09/05/2014, 09/12/2014, 09/19/2014)  Initiation of adjuvant radiation/Xeloda 10/07/2014; completed 11/11/2014  Resumption of adjuvant  gemcitabine 11/28/2014, completed 02/27/2015  Elevated CA 19-9 05/05/2015  CT 05/22/2015 with a vague hypoattenuating lesion in segment 4 of the liver  Ultrasound 06/03/2015 revealed a hypoechoic focus in the central liver, possibly corresponding to the CT finding from 05/22/2015  2. Intermittent right upper abdominal pain beginning in the summer of 2015-likely related to symptomatic cholelithiasis. Improved.  3. History of colon polyps  4. Family history of multiple cancers including colon cancer, status post a genetics evaluation. Positive for the BRCA1 mutation.  5. Elevated liver enzymes 09/07/2014; persistent  6.   Upper abdomen/mid back pain-etiology unclear   Disposition:  Mr. Ronnie Nicholson presents with increased abdominal pain. The abdomen is mildly distended and he has frequent belching. The discomfort could be related to adhesions or progressive gallbladder carcinoma. I doubt the pain is related to the liver finding on the CT last month. He will be referred for a plain x-ray of the abdomen. I will discuss the case with Dr. Barry Dienes. He is scheduled for repeat CT of the abdomen next month.  I encouraged Mr. Ronnie Nicholson to use hydrocodone (prescribed with surgery last year) as needed for pain. He will contact us for increased pain or new symptoms.  He will return as scheduled with the restaging CT next month unless the pain progresses in the interim.  Betsy Coder, MD  07/08/2015  2:51 PM

## 2015-07-08 NOTE — Telephone Encounter (Signed)
Made appointment for today per 1/31 pof. Patient is aware of date/time per pof.

## 2015-07-09 ENCOUNTER — Telehealth: Payer: Self-pay | Admitting: *Deleted

## 2015-07-09 ENCOUNTER — Telehealth: Payer: Self-pay

## 2015-07-09 ENCOUNTER — Ambulatory Visit (HOSPITAL_COMMUNITY)
Admission: RE | Admit: 2015-07-09 | Discharge: 2015-07-09 | Disposition: A | Discharge status: Home / Self Care | Payer: Medicare Other | Source: Ambulatory Visit | Attending: Family Medicine | Admitting: Family Medicine

## 2015-07-09 ENCOUNTER — Encounter: Payer: Self-pay | Admitting: Family Medicine

## 2015-07-09 ENCOUNTER — Ambulatory Visit (INDEPENDENT_AMBULATORY_CARE_PROVIDER_SITE_OTHER): Payer: Medicare Other | Admitting: Family Medicine

## 2015-07-09 VITALS — BP 116/62 | Ht 73.0 in | Wt 176.0 lb

## 2015-07-09 DIAGNOSIS — I82A11 Acute embolism and thrombosis of right axillary vein: Secondary | ICD-10-CM | POA: Diagnosis not present

## 2015-07-09 DIAGNOSIS — Z139 Encounter for screening, unspecified: Secondary | ICD-10-CM

## 2015-07-09 DIAGNOSIS — M79601 Pain in right arm: Secondary | ICD-10-CM | POA: Diagnosis not present

## 2015-07-09 DIAGNOSIS — I82C11 Acute embolism and thrombosis of right internal jugular vein: Secondary | ICD-10-CM | POA: Diagnosis not present

## 2015-07-09 DIAGNOSIS — I82B11 Acute embolism and thrombosis of right subclavian vein: Secondary | ICD-10-CM | POA: Insufficient documentation

## 2015-07-09 DIAGNOSIS — M25521 Pain in right elbow: Secondary | ICD-10-CM | POA: Diagnosis not present

## 2015-07-09 DIAGNOSIS — I82621 Acute embolism and thrombosis of deep veins of right upper extremity: Secondary | ICD-10-CM

## 2015-07-09 MED ORDER — PANTOPRAZOLE SODIUM 40 MG PO TBEC: 40.0000 mg | DELAYED_RELEASE_TABLET | Freq: Every day | ORAL | Status: DC

## 2015-07-09 MED ORDER — APIXABAN 5 MG PO TABS: ORAL_TABLET | ORAL | Status: DC

## 2015-07-09 NOTE — Telephone Encounter (Signed)
Called patient after receipt of voicemail "requesting result of yesterday's xray.  Also would like to know what Dr. Benay Spice and Dr. Barry Dienes discussed about Edgard's pain situation because it's been a year since surgery.  Pain is not everyday but lasts three hours and he has to walk the floor he hurts so bad.  He also experienced his first episode of nausea and vomiting yesterday on way home from office visit with Dr. Benay Spice.  Had to get out off car to vomit.  I carry compazine with [place]. He took compazine and has settles down with no further N/V."       Shared results of xray negative with this call.

## 2015-07-09 NOTE — Telephone Encounter (Signed)
-----   Message from Ladell Pier, MD sent at 07/08/2015  7:50 PM EST ----- Please call patient, xray negative for obstruction, f/u as scheduled  Call for increased pain

## 2015-07-09 NOTE — Telephone Encounter (Signed)
Pt saw Dr Wolfgang Phoenix this AM at [facility]. Via [place] dr Wolfgang Phoenix found small blood clots in his arm. Does he need to restrict the activity of his arm? What are the chances of the clots moving? Is it possible there is a clot in his abdomen causing the abd pain?

## 2015-07-09 NOTE — Telephone Encounter (Signed)
Pt's wife returned call, reports he vomited yesterday. Had to pull over to side of the road. Wanted to make MD aware so he may discuss with Dr. Barry Dienes. Returned call to Mrs. Derksen: Dr. Benay Spice discussed case with Dr. Barry Dienes. Recommend Protonix daily. Rx sent to pharmacy. Call office for increased pain, will need to see GI. Mrs. Knisely voiced understanding.

## 2015-07-09 NOTE — Telephone Encounter (Addendum)
No new note. 

## 2015-07-09 NOTE — Progress Notes (Signed)
Subjective:    Patient ID: [name], male    DOB: 1941-06-03, 75 y.o.   MRN: [id number]  Arm Pain  Incident onset: one week ago. The pain is present in the right forearm. Treatments tried: hydrocodone. The treatment provided moderate relief.   Patient patient was having right arm pain started over the past 24 hours he has had some intermittent intermittent permit and abdominal discomfort with some slight nausea he was seen by his oncology specialist yesterday they did a plain x-ray and set him up for a scan in a few weeks on a previous scan there was an area that he was concerned about the possibility of a reoccurrence of cancer. There is no swelling in the arms. No shortness breath no chest tightness pressure pain no nausea or vomiting he does have a Port-A-Cath on the right side. He also has a history gallbladder cancer in being treated although apparently in remission currently he has no prior history of any type of blood clots he is not had any significant shortness of breath in addition to this has no family history of any type of blood clots.   Review of Systems denies chest tightness pressure pain shortness breath nausea from diarrhea relates some intermittent arm pain    Objective:   Physical Exam Lungs clear no crackles heart is regular no murmurs no sinus CHF and extremities no edema right arm there is no tenderness in the area where he says it hurts is in the brachial vein region. I don't find any evidence of any swelling there is no tenderness in the axilla no abnormal JVD.       Assessment & Plan:  Stat x-ray of the arm as well as ultrasound of the arm with possibility of a blood clot X-ray came back negative Ultrasound came back showing blood clot Probably associated with the Port-A-Cath but also associated with underlying cancer history I spoke with the patient regarding all of these issues. I discussed the treatment options including anticoagulants. I discussed the  risk of bleeding. Also discussed the importance of treating this to prevent possibility of death. We did discuss how bleeding from anticoagulants could be very serious and would need immediate attention in the ER should it occur Additional conversations with pharmacy ordering the appropriate medicine only to find out that the [employer] insurance denied it. So therefore we went with left Geralynn Ochs and we will do that 80 mg twice daily and be starting Coumadin within the next 2 days. We will try to appeal getting Eliquis covered but it is unlikely that it will be covered. I did have a detailed discussion with the patient and his wife that the intermittent abdominal pain he is been having in the opinion of the specialist was unlikely to be a blood clot. It is important for him to do a follow-up CAT scan in a few weeks time. In addition to this I discussed how using PPI currently is a good idea because her could be some dyspepsia contributed into abdominal pain plus also PPI could prevent ulcer which intern could prevent serious bleeding. We went over warning signs watch for regarding complications from the DVT in the importance of going to the ER with any of those problems. They will also follow-up with Dr. Richardson Landry in approximately 7 days and they will notify us if any problems in addition to this approximately 50-60 minutes was spent with this patient regarding these issues and treatment discussions greater than half was in  discussion of these multiple issues.  At this point in time cannot rule out the possibility of reoccurrence of cancer specialist will be handling this aspect.

## 2015-07-10 ENCOUNTER — Telehealth: Payer: Self-pay | Admitting: Family Medicine

## 2015-07-10 ENCOUNTER — Other Ambulatory Visit: Payer: Self-pay | Admitting: Nurse Practitioner

## 2015-07-10 ENCOUNTER — Other Ambulatory Visit: Payer: Self-pay

## 2015-07-10 ENCOUNTER — Telehealth: Payer: Self-pay | Admitting: Nurse Practitioner

## 2015-07-10 ENCOUNTER — Telehealth: Payer: Self-pay | Admitting: *Deleted

## 2015-07-10 DIAGNOSIS — I82621 Acute embolism and thrombosis of deep veins of right upper extremity: Secondary | ICD-10-CM

## 2015-07-10 DIAGNOSIS — C23 Malignant neoplasm of gallbladder: Secondary | ICD-10-CM

## 2015-07-10 MED ORDER — WARFARIN SODIUM 5 MG PO TABS: 5.0000 mg | ORAL_TABLET | Freq: Every day | ORAL | Status: DC

## 2015-07-10 NOTE — Telephone Encounter (Signed)
Please see previous notes, please have your staff assist patient with following up on Monday for INR. If we can be of help in the future let us know. Thank you for your help

## 2015-07-10 NOTE — Telephone Encounter (Signed)
Called pt's wife with appointment for Lab/office 2/6. They picked up Coumadin and he will begin taking that today as instructed by PCP.

## 2015-07-10 NOTE — Telephone Encounter (Signed)
Patient states he done fine with the Lovenox injections. Patient is ok with starting on coumadin but wants this managed through Dr. Benay Spice. Dr. Nicki Reaper spoke with Dr. Benay Spice. Notified wife that Dr. Benay Spice wants patient to take Coumadin 5 mg daily and come in to his office on Monday for a recheck. Wife verbalized understanding. Med sent to pharmacy.

## 2015-07-10 NOTE — Telephone Encounter (Signed)
Wife called to see if they can get into see Dr Benay Spice next week to discuss the blood clot and treatment. Pt is currently on lovenox- has 5 syringes plus 2 refills. Dr Benay Spice will be monitoring.Knows that other meds were discussed and would like to know plan.   Wife can be reached on cell phone [phone]

## 2015-07-10 NOTE — Telephone Encounter (Signed)
Verify Lovenox shot went ok. We are APPEALING eLIQUIS BUT WE DOUBT IT WILL BE APPROVED. dR sHERRILL REC COUMADIN AS SECOND CHOICE. fAMILY HAS 2 OPTIONS - #1- WE START COUMADIN and check INR through our office or #2- dr Benay Spice handles but this would require more freewq trips to [facility]- see what they want to do

## 2015-07-10 NOTE — Telephone Encounter (Signed)
per pof to sch pt appt-cld & left pt a message and adv of appt time & date °

## 2015-07-10 NOTE — Telephone Encounter (Signed)
Our offices spoken with the patient. Family would prefer to follow with specialist for this issue. He is using the Lovenox twice a day. He understands to stay on the Lovenox until he is directed by Dr.Sherrill to stop the Lovenox. He will start Coumadin today 5 mg daily. He will call his specialists if any bleeding issues. Patient has been warned not to do any dangerous activity while on blood thinners. He was advised to stop aspirin. He will follow-up with the specialist on Monday they will monitor and prescribed ongoing Coumadin management of his DVT.

## 2015-07-11 ENCOUNTER — Telehealth: Payer: Self-pay | Admitting: Family Medicine

## 2015-07-11 NOTE — Telephone Encounter (Signed)
Rx prior auth APPROVED for pt's apixaban (ELIQUIS) 5 MG TABS tablet, valid 07/09/15 - until further notice, called & notified pt's wife Per Dr. Nicki Reaper - pt decided to let his oncologist manage this, pt is currently on Lovenox & Coumadin - notified pt's wife of approval and she will let the oncologist know in case he wants to switch pt's treatment

## 2015-07-14 ENCOUNTER — Telehealth: Payer: Self-pay

## 2015-07-14 ENCOUNTER — Other Ambulatory Visit (HOSPITAL_BASED_OUTPATIENT_CLINIC_OR_DEPARTMENT_OTHER): Payer: Medicare Other

## 2015-07-14 ENCOUNTER — Ambulatory Visit (HOSPITAL_BASED_OUTPATIENT_CLINIC_OR_DEPARTMENT_OTHER): Payer: Medicare Other | Admitting: Nurse Practitioner

## 2015-07-14 ENCOUNTER — Telehealth: Payer: Self-pay | Admitting: Family Medicine

## 2015-07-14 VITALS — BP 113/48 | HR 58 | Temp 98.1°F | Resp 18 | Ht 73.0 in | Wt 175.8 lb

## 2015-07-14 DIAGNOSIS — C23 Malignant neoplasm of gallbladder: Secondary | ICD-10-CM | POA: Diagnosis present

## 2015-07-14 DIAGNOSIS — I82621 Acute embolism and thrombosis of deep veins of right upper extremity: Secondary | ICD-10-CM

## 2015-07-14 LAB — PROTIME-INR
INR: 1.1 — AB (ref 2.00–3.50)
Protime: 13.2 Seconds (ref 10.6–13.4)

## 2015-07-14 MED ORDER — ENOXAPARIN SODIUM 120 MG/0.8ML ~~LOC~~ SOLN: 120.0000 mg | SUBCUTANEOUS | Status: DC

## 2015-07-14 NOTE — Telephone Encounter (Signed)
As per Owens Shark NP call placed to Dr. Malachy Moan office spoke with Larene Beach to leave a message that the Pt.'s Coumadin and Lovenox dose has change. Informed Larene Beach of change and that a PT/INR needs to be scheduled for this Thursday. Also informed her that Pt. Would like to Monitor coumadin management at Dr. Malachy Moan office.

## 2015-07-14 NOTE — Progress Notes (Signed)
[place] OFFICE PROGRESS NOTE   Diagnosis:  Gallbladder cancer  INTERVAL HISTORY:   Ronnie Nicholson returns for follow-up. On 07/09/2015 he was found to have a DVT of the right arm. He is currently on Lovenox and Coumadin. The right arm pain has resolved. He notes improvement in the abdominal pain since beginning Protonix. He has intermittent mild nausea. He notes a poor appetite with his evening meal. Bowels are moving.  Objective:  Vital signs in last 24 hours:  Blood pressure 113/48, pulse 58, temperature 98.1 F (36.7 C), temperature source Oral, resp. rate 18, height 6' 1"  (1.854 m), weight 175 lb 12.8 oz (79.742 kg), SpO2 100 %.    HEENT: No thrush or ulcers. Resp: Lungs clear bilaterally. Cardio: Regular rate and rhythm. GI: Abdomen is nontender. No organomegaly. No mass. Vascular: No arm or leg edema. Skin: A few small ecchymoses scattered over the abdominal wall at the site of prior Lovenox injections. The largest ecchymosis at the right abdominal wall likely has an underlying small hematoma.    Lab Results:  Lab Results  Component Value Date   WBC 5.4 06/17/2015   HGB 12.0* 06/17/2015   HCT 36.4* 06/17/2015   MCV 93.6 06/17/2015   PLT 178 06/17/2015   NEUTROABS 4.1 06/17/2015    Imaging:  No results found.  Medications: I have reviewed the patient's current medications.  Assessment/Plan: 1. 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, M0), status post a cholecystectomy 06/24/2014 with a 2.5 cm gallbladder mucosal mass with extension into attached hepatic parenchyma, positive hepatic parenchymal and cystic duct margins, diffuse lymphovascular invasion  Elevated CA 19-9  Partial liver resection, bile duct resection, lymph node resection on 08/01/2014 with the pathology confirming residual invasive adenocarcinoma in the liver-negative margins, metastatic adenocarcinoma with extranodal extension involving one lymph  node  Final pathologic stage IIIB (pT3,pN1)  Initiation of adjuvant gemcitabine 09/05/2014 (09/05/2014, 09/12/2014, 09/19/2014)  Initiation of adjuvant radiation/Xeloda 10/07/2014; completed 11/11/2014  Resumption of adjuvant gemcitabine 11/28/2014, completed 02/27/2015  Elevated CA 19-9 05/05/2015  CT 05/22/2015 with a vague hypoattenuating lesion in segment 4 of the liver  Ultrasound 06/03/2015 revealed a hypoechoic focus in the central liver, possibly corresponding to the CT finding from 05/22/2015  2. Intermittent right upper abdominal pain beginning in the summer of 2015-likely related to symptomatic cholelithiasis. Improved.  3. History of colon polyps  4. Family history of multiple cancers including colon cancer, status post a genetics evaluation. Positive for the BRCA1 mutation.  5. Elevated liver enzymes 09/07/2014; persistent  6. Upper abdomen/mid back pain-etiology unclear  7.   DVT right arm 07/09/2015. Currently on Coumadin and Lovenox.   Disposition: Since his last visit Ronnie Nicholson was diagnosed with a right upper extremity DVT. He is currently on Lovenox and Coumadin. The Coumadin remains subtherapeutic. He will increase the dose from 5 mg daily to 7.5 mg on a Monday Wednesday Friday schedule with continuation of 5 mg all other days. We adjusted the Lovenox from twice daily dosing to once daily dosing. A new prescription was sent to his pharmacy for the Lovenox. We discussed in detail how Coumadin is monitored. He lives approximately 40 miles from our office. He prefers to have his Coumadin managed by Dr. Wolfgang Phoenix. We will contact Dr. Lance Sell office with this information and request that he have the PT/INR repeated on 07/17/2015 with further adjustments per Dr. Wolfgang Phoenix.  He is scheduled for CT scans 07/31/2015 and has a follow-up visit with Dr.  Sherrill 08/01/2015. He will contact the office in the interim with any problems.  Plan reviewed with Dr. Benay Spice.  30 minutes were spent face-to-face at today's visit with the majority of that time involved in counseling/coordination of care.    Ned Card ANP/GNP-BC   07/14/2015  9:24 AM

## 2015-07-14 NOTE — Patient Instructions (Signed)
Increase coumadin to 7.5 mg on Monday, Wednesday and Friday; continue 5 mg all other days Have repeat PT/INR at Dr. Lance Sell office on 07/17/15 (we will call his office to arrange)

## 2015-07-14 NOTE — Telephone Encounter (Signed)
Spoke wife and informed her per Dr.Steve Luking INR to be done this Thursday here in office. Patient wife stated that patient has regular office visit already scheduled for Thursday. Informed patient INR can be done on Thursday during office visit.

## 2015-07-14 NOTE — Telephone Encounter (Signed)
Ok do INR on thursday

## 2015-07-14 NOTE — Telephone Encounter (Signed)
Received phone call from Cancer center at [facility] office informing us that patient's Lovenox and Coumadin has been changed by [name]. Lovenox was changed to 120 mg of lovenox once daily, and Coumadin 7.5 on Mon- Wed-Fri and 5mg  of coumadin on all other days. Patient would also like for [place] to manage INR's here at our office so that he does not have to travel to [place]. Patient's next INR is due this Thursday 07/17/15.

## 2015-07-17 ENCOUNTER — Telehealth: Payer: Self-pay | Admitting: *Deleted

## 2015-07-17 ENCOUNTER — Other Ambulatory Visit: Payer: Self-pay | Admitting: *Deleted

## 2015-07-17 ENCOUNTER — Ambulatory Visit (INDEPENDENT_AMBULATORY_CARE_PROVIDER_SITE_OTHER): Payer: Medicare Other | Admitting: Family Medicine

## 2015-07-17 ENCOUNTER — Encounter: Payer: Self-pay | Admitting: Family Medicine

## 2015-07-17 VITALS — Ht 73.0 in | Wt 176.0 lb

## 2015-07-17 DIAGNOSIS — I10 Essential (primary) hypertension: Secondary | ICD-10-CM | POA: Diagnosis not present

## 2015-07-17 DIAGNOSIS — C801 Malignant (primary) neoplasm, unspecified: Secondary | ICD-10-CM | POA: Diagnosis not present

## 2015-07-17 DIAGNOSIS — M79601 Pain in right arm: Secondary | ICD-10-CM | POA: Diagnosis not present

## 2015-07-17 DIAGNOSIS — I82621 Acute embolism and thrombosis of deep veins of right upper extremity: Secondary | ICD-10-CM

## 2015-07-17 DIAGNOSIS — Z7901 Long term (current) use of anticoagulants: Secondary | ICD-10-CM

## 2015-07-17 DIAGNOSIS — Z139 Encounter for screening, unspecified: Secondary | ICD-10-CM

## 2015-07-17 LAB — POC HEMOCCULT BLD/STL (HOME/3-CARD/SCREEN)
Card #2 Fecal Occult Blod, POC: POSITIVE
FECAL OCCULT BLD: POSITIVE
FECAL OCCULT BLD: POSITIVE — AB

## 2015-07-17 LAB — POCT INR: INR: 1.8

## 2015-07-17 NOTE — Telephone Encounter (Signed)
Hemoccult cards tested today. Positive x 3. Pt was in office today.

## 2015-07-17 NOTE — Progress Notes (Signed)
   Subjective:    Patient ID: Ronnie Nicholson, male    DOB: Feb 14, 1941, 75 y.o.   MRN: [id number] Patient arrives office for an extremely protracted discussion concerning all of his medical challenges. Next  Patient has been through a diagnosis of gallbladder cancer. Invasive in nature. Initial cholestatic cholecystectomy revealed extension into the liver parenchyma. Had a harsh a liver resection. One node involvement. Next  Patient developed right arm pain. Ultrasound revealed DVT.   Patient also notes epigastric discomfort right upper quadrant discomfort and early satiety. Proton pump inhibitor just initiated recently.  Patient claims compliance with all medications. HPI  Patient arrives to follow up on blood clot in right upper arm Results for orders placed or performed in visit on 07/17/15  POCT INR  Result Value Ref Range   INR 1.8   Patient currently on lovenox 120 once in pm and coumadin 5 mg 1.5  Tab on Mon, Wed, Fri and one tab all other days Review of Systems  No headache no chest pain no shortness breasts month abdominal pain no change in bowel habits no blood in stool no blood in urine     Objective:   Physical Exam  Alert vital stable chest H&T normal right arm no venous distention mild pain to deep palpation upper arm good range of motion lungs clear heart rare rhythm ankles without edema  Abdominal exam see prior note of earlier this week    Assessment & Plan:  Impression 1 DVT right arm. Complicated by Port-A-Cath. Complicated by cancer. Compliant with Coumadin at this time. Compliant with Lovenox No. 2 invasive gallbladder cancer with extension to liver repeat scan pending #3 abdominal discomfort plan maintain same dose of Coumadin. Maintain Lovenox. Recheck Monday. Many questions answered. General discussion had regarding patient's overall situation, all recent notes and blood work reviewed with patient. Recheck on Monday WSL addendum Hemoccult cards returned  positive for all 3. Patient actually needs his anticoagulation at this point. No history of gross GI bleed. When he returns in several days. We will also do hemoglobin. In addition patient is on a proton pump inhibitor 40 minutes spent with patient most in discussion WSL

## 2015-07-21 ENCOUNTER — Encounter: Payer: Self-pay | Admitting: Family Medicine

## 2015-07-21 ENCOUNTER — Ambulatory Visit (INDEPENDENT_AMBULATORY_CARE_PROVIDER_SITE_OTHER): Payer: Medicare Other | Admitting: Family Medicine

## 2015-07-21 VITALS — BP 112/64 | Ht 73.0 in | Wt 176.0 lb

## 2015-07-21 DIAGNOSIS — R195 Other fecal abnormalities: Secondary | ICD-10-CM | POA: Diagnosis not present

## 2015-07-21 DIAGNOSIS — D509 Iron deficiency anemia, unspecified: Secondary | ICD-10-CM | POA: Diagnosis not present

## 2015-07-21 DIAGNOSIS — D649 Anemia, unspecified: Secondary | ICD-10-CM

## 2015-07-21 DIAGNOSIS — Z7901 Long term (current) use of anticoagulants: Secondary | ICD-10-CM

## 2015-07-21 LAB — POCT HEMOGLOBIN: HEMOGLOBIN: 10.9 g/dL — AB (ref 14.1–18.1)

## 2015-07-21 LAB — POCT INR: INR: 2.4

## 2015-07-21 NOTE — Patient Instructions (Addendum)
Start one iron gluconate tablet twice per day  Take one multivit with folic acid daily  Coumadin 5mg . Take one tablet daily and one and a half tablet on Wednesday and Saturday. Recheck on Friday 7/17. Stop lovenox

## 2015-07-21 NOTE — Progress Notes (Signed)
   Subjective:    Patient ID: Ronnie Nicholson, male    DOB: 1940-12-18, 75 y.o.   MRN: [id number] Patient returns office for yet further challenges with his ongoing clinical situation.  Claims compliance with Coumadin not missing a dose. HPIFollow up DVT. INR today 2.4. Taking coumadin and lovenox in the afternoon. Next  States right arm is less painful no obvious swelling no substantial discomfort patient  Brought in Hemoccult cards. See prior notes these were initiated after epigastric fullness discomfort early satiety and slight nausea. Hemoccult cards are positive. Patient's hemoglobin baseline is been 12 over the past couple years on review CBCs. Next  Results for orders placed or performed in visit on 07/21/15  POCT INR  Result Value Ref Range   INR 2.4   POCT hemoglobin  Result Value Ref Range   Hemoglobin 10.9 (A) 14.1 - 18.1 g/dL     Hb 10.9.  Pt states no concerns today. No headache no chest pain no abdominal pain no change in bowel habits    Review of Systems    review systems otherwise negative Objective:   Physical Exam  Alert vitals stable blood pressure good HEENT normal lungs clear heart rare rhythm slight epigastric tenderness no masses no rebound no guarding positive tenderness surgical site      Assessment & Plan:  Impression 1 epigastric pain with Hemoccult positive stools. Could represent gastritis or other more worrisome conditions with history #2 #2 gallbladder cancer and partial resection of liver #3 anticoagulation discussed therapeutic today #4 anemia and discuss with iron deficiency plan initiate iron gluconate. Initiate multivitamin. GI referral rationale discussed. Maintain proton pump inhibitor. Follow-up with specialist. Stop Lovenox. Maintain Coumadin WSL

## 2015-07-25 ENCOUNTER — Ambulatory Visit (INDEPENDENT_AMBULATORY_CARE_PROVIDER_SITE_OTHER): Payer: Medicare Other | Admitting: *Deleted

## 2015-07-25 ENCOUNTER — Ambulatory Visit (INDEPENDENT_AMBULATORY_CARE_PROVIDER_SITE_OTHER): Payer: Medicare Other | Admitting: Internal Medicine

## 2015-07-25 ENCOUNTER — Encounter: Payer: Self-pay | Admitting: Internal Medicine

## 2015-07-25 VITALS — BP 118/60 | HR 76 | Ht 73.0 in | Wt 177.4 lb

## 2015-07-25 DIAGNOSIS — C23 Malignant neoplasm of gallbladder: Secondary | ICD-10-CM

## 2015-07-25 DIAGNOSIS — Z7901 Long term (current) use of anticoagulants: Secondary | ICD-10-CM

## 2015-07-25 DIAGNOSIS — R195 Other fecal abnormalities: Secondary | ICD-10-CM | POA: Diagnosis not present

## 2015-07-25 DIAGNOSIS — D649 Anemia, unspecified: Secondary | ICD-10-CM | POA: Diagnosis not present

## 2015-07-25 LAB — POCT INR: INR: 2.4

## 2015-07-25 NOTE — Progress Notes (Signed)
Subjective:    Patient ID: [name], male    DOB: 02/20/1941, 75 y.o.   MRN: [id number] Chief complaint: Heme positive stool and anemia HPI Patient is here with his wife, is a 75 year old white man I know from previous colonoscopy last in 2013 with hemorrhoids and small adenomas, he was diagnosed with gallbladder cancer after a cholecystectomy for stones in late 2015 early 2016. Subsequently had a more aggressive wedge resection by Dr. Barry Dienes. He has been followed by Dr. Geanie Cooley also. He has had radiation and chemotherapy for this cancer. His wife gives most of the history apparently is having a lot of abdominal pain off and on since the surgery he has been unable to regain weight and has been a lot of fatigue. About 2 weeks ago he was started on PPI with pantoprazole by Dr. Ammie Dalton and has been much better. Around this time. He also developed a right upper extremity DVT in was started on Lovenox and warfarin. Dr. Wolfgang Phoenix had seen him and he was anemic and Hemoccult cards were presented and at least one of these was positive. That is why is here today with the heme positive anemia his last hemoglobin was 10.9, it was 12 on January 10. Other than the heme positive stool there's been no sign of bleeding. No melena or bright red blood per rectum. Other significant laboratory studies show that he has a rising CA-19-9. The alkaline phosphatase is rising as well. CT scanning in December demonstrated a vague low-attenuation lesion in segment 4A of the liver and a small amount of free abdominal and pelvic fluid. A follow-up CT scan is scheduled for February 23. It looks like, from chart review the patient is not able to undergo MRI due to metal in the year area from a prior year surgery.  Medications, allergies, past medical history, past surgical history, family history and social history are reviewed and updated in the EMR.  Review of Systems As per history of present illness. He wears  eyeglasses. Ever since the surgery his bowel movements have been smaller and more difficult to cleanse any also has frequent belching. All other review of systems are negative.    Objective:   Physical Exam @BP  118/60 mmHg  Pulse 76  Ht 6\' 1"  (1.854 m)  Wt 177 lb 6.4 oz (80.468 kg)  BMI 23.41 kg/m2@  General:  Well-developed, well-nourished and in no acute distress Eyes:  anicteric. ENT:   Mouth and posterior pharynx free of lesions.  Neck:   supple w/o thyromegaly or mass.  Lungs: Clear to auscultation bilaterally. Heart:  S1S2, no rubs, murmurs, gallops. Abdomen:  soft, non-tender, no hepatosplenomegaly, hernia, or mass and BS+. There is a small ecchymotic area in the right mid quadrant from prior Lovenox injection Extremities:   no edema, cyanosis or clubbing Skin   no rash. Perhaps slightly pale Neuro:  A&O x 3.  Psych:  appropriate mood and  Affect.   Data Reviewed: As per history of present illness guarding labs endoscopic studies CT scanning and I looked at the images of 05/22/15 CT abd/pelvis Also reviewed recent oncology and primary care notes  Lab Results  Component Value Date   INR 2.4 07/21/2015   INR 1.8 07/17/2015   INR 1.10* 07/14/2015   PROTIME 13.2 07/14/2015       Assessment & Plan:   1. Heme + stool   2. Chronic anemia   3. Gallbladder cancer ([place])    Gallbladder cancer he summary  as follows: 1. 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, M0), status post a cholecystectomy 06/24/2014 with a 2.5 cm gallbladder mucosal mass with extension into attached hepatic parenchyma, positive hepatic parenchymal and cystic duct margins, diffuse lymphovascular invasion  Elevated CA 19-9  Partial liver resection, bile duct resection, lymph node resection on 08/01/2014 with the pathology confirming residual invasive adenocarcinoma in the liver-negative margins, metastatic adenocarcinoma with extranodal extension involving one lymph  node  Final pathologic stage IIIB (pT3,pN1)  Initiation of adjuvant gemcitabine 09/05/2014 (09/05/2014, 09/12/2014, 09/19/2014)  Initiation of adjuvant radiation/Xeloda 10/07/2014; completed 11/11/2014  Resumption of adjuvant gemcitabine 11/28/2014, completed 02/27/2015  Elevated CA 19-9 05/05/2015  CT 05/22/2015 with a vague hypoattenuating lesion in segment 4 of the liver  Ultrasound 06/03/2015 revealed a hypoechoic focus in the central liver, possibly corresponding to the CT finding from 05/22/2015  Heme Positive anemia has multiple possible sources. Could be a false positive occult blood test, he did have hemorrhoids and was started on Lovenox and warfarin around the time of this testing. What seems most likely unfortunately has progressive recurrent disease from the gallbladder cancer. He could have loss of blood through the bile ducts going to the intestine. There could be gastrointestinal metastases at this point. He could've had peptic ulcer disease or gastritis as well causing a heme positive stool, he seems better on pantoprazole.  Endoscopic evaluation is possibly needed but I think it makes sense to wait until the CT scan is completed next week which could lead me in one direction or the other. If he needs a colonoscopy I think it would be prudent to try to hold his warfarin most likely depending upon what would be seen, but for an upper endoscopy I don't think I need to hold his warfarin. Pending upon the CT imaging, endoscopic evaluation could even be a moot point if there are metastases that give [place] an answer that is plausible. I will coordinate with Dr. Ammie Dalton.  He has been started on iron supplementation by Dr. Wolfgang Phoenix. MCV was 93 in Jan.  I have explained  all this to the patient and his wife.  I appreciate the opportunity to care for this patient. CC: Mickie Hillier, MD Geanie Cooley M.D.

## 2015-07-25 NOTE — Patient Instructions (Addendum)
   We will be back in touch with you next week with plans after you have your CT on 07/31/15.     I appreciate the opportunity to care for you. Silvano Rusk, MD, [facility]

## 2015-07-31 ENCOUNTER — Other Ambulatory Visit: Payer: Self-pay | Admitting: Family Medicine

## 2015-07-31 ENCOUNTER — Ambulatory Visit (HOSPITAL_COMMUNITY)
Admission: RE | Admit: 2015-07-31 | Discharge: 2015-07-31 | Disposition: A | Discharge status: Home / Self Care | Payer: Medicare Other | Source: Ambulatory Visit | Attending: Oncology | Admitting: Oncology

## 2015-07-31 ENCOUNTER — Ambulatory Visit (HOSPITAL_BASED_OUTPATIENT_CLINIC_OR_DEPARTMENT_OTHER): Payer: Medicare Other

## 2015-07-31 ENCOUNTER — Encounter (HOSPITAL_COMMUNITY): Payer: Self-pay

## 2015-07-31 VITALS — BP 141/49 | HR 61 | Temp 97.9°F | Resp 18

## 2015-07-31 DIAGNOSIS — Z452 Encounter for adjustment and management of vascular access device: Secondary | ICD-10-CM

## 2015-07-31 DIAGNOSIS — R188 Other ascites: Secondary | ICD-10-CM | POA: Insufficient documentation

## 2015-07-31 DIAGNOSIS — Z9049 Acquired absence of other specified parts of digestive tract: Secondary | ICD-10-CM | POA: Insufficient documentation

## 2015-07-31 DIAGNOSIS — C787 Secondary malignant neoplasm of liver and intrahepatic bile duct: Secondary | ICD-10-CM | POA: Diagnosis not present

## 2015-07-31 DIAGNOSIS — C23 Malignant neoplasm of gallbladder: Secondary | ICD-10-CM | POA: Insufficient documentation

## 2015-07-31 MED ORDER — SODIUM CHLORIDE 0.9% FLUSH
10.0000 mL | INTRAVENOUS | Status: DC | PRN
  Administered 2015-07-31: 10 mL via INTRAVENOUS
  Filled 2015-07-31: qty 10

## 2015-07-31 MED ORDER — IOHEXOL 300 MG/ML  SOLN
100.0000 mL | Freq: Once | INTRAMUSCULAR | Status: AC | PRN
  Administered 2015-07-31: 100 mL via INTRAVENOUS

## 2015-07-31 NOTE — Patient Instructions (Signed)

## 2015-08-01 ENCOUNTER — Ambulatory Visit: Payer: Medicare Other

## 2015-08-01 ENCOUNTER — Ambulatory Visit (HOSPITAL_BASED_OUTPATIENT_CLINIC_OR_DEPARTMENT_OTHER): Payer: Medicare Other | Admitting: Oncology

## 2015-08-01 ENCOUNTER — Ambulatory Visit (HOSPITAL_BASED_OUTPATIENT_CLINIC_OR_DEPARTMENT_OTHER): Payer: Medicare Other

## 2015-08-01 VITALS — BP 116/59 | HR 62 | Temp 97.8°F | Resp 18 | Ht 73.0 in | Wt 177.5 lb

## 2015-08-01 DIAGNOSIS — R1011 Right upper quadrant pain: Secondary | ICD-10-CM

## 2015-08-01 DIAGNOSIS — C23 Malignant neoplasm of gallbladder: Secondary | ICD-10-CM | POA: Diagnosis present

## 2015-08-01 DIAGNOSIS — R110 Nausea: Secondary | ICD-10-CM

## 2015-08-01 LAB — CBC WITH DIFFERENTIAL/PLATELET
BASO%: 0.6 % (ref 0.0–2.0)
BASOS ABS: 0 10*3/uL (ref 0.0–0.1)
EOS ABS: 0.1 10*3/uL (ref 0.0–0.5)
EOS%: 2.2 % (ref 0.0–7.0)
HCT: 36 % — ABNORMAL LOW (ref 38.4–49.9)
HGB: 11.6 g/dL — ABNORMAL LOW (ref 13.0–17.1)
LYMPH%: 10.5 % — AB (ref 14.0–49.0)
MCH: 30 pg (ref 27.2–33.4)
MCHC: 32.2 g/dL (ref 32.0–36.0)
MCV: 93.4 fL (ref 79.3–98.0)
MONO#: 0.6 10*3/uL (ref 0.1–0.9)
MONO%: 10.5 % (ref 0.0–14.0)
NEUT%: 76.2 % — ABNORMAL HIGH (ref 39.0–75.0)
NEUTROS ABS: 4.4 10*3/uL (ref 1.5–6.5)
PLATELETS: 163 10*3/uL (ref 140–400)
RBC: 3.85 10*6/uL — AB (ref 4.20–5.82)
RDW: 14.7 % — ABNORMAL HIGH (ref 11.0–14.6)
WBC: 5.8 10*3/uL (ref 4.0–10.3)
lymph#: 0.6 10*3/uL — ABNORMAL LOW (ref 0.9–3.3)

## 2015-08-01 LAB — FERRITIN: FERRITIN: 53 ng/mL (ref 22–316)

## 2015-08-01 NOTE — Progress Notes (Signed)
[place] OFFICE PROGRESS NOTE   Diagnosis:  Gallbladder carcinoma  INTERVAL HISTORY:    Mr. Baris returns as scheduled. The right arm discomfort resolved when he was started on anticoagulation therapy. He continues Coumadin.   he reports resolution of abdominal discomfort and nausea since beginning Protonix.   He feels well at present.  Objective:  Vital signs in last 24 hours:  Blood pressure 116/59, pulse 62, temperature 97.8 F (36.6 C), temperature source Oral, resp. rate 18, height 6' 1" (1.854 m), weight 177 lb 8 oz (80.513 kg), SpO2 100 %.    HEENT:  Neck without mass Lymphatics:  No cervical, supraclavicular, or axillary nodes Resp:  Lungs clear bilaterally Cardio:  Regular rate and rhythm GI:  No hepatomegaly, no mass, nontender Vascular:  No leg edema , right arm without erythema or edema  Lab Results:   hemoglobin 11.6, platelets 163,000, MCV 93.4, white count 5.8, ANC 4.4  Imaging:  Ct Abdomen Pelvis W Contrast  07/31/2015  CLINICAL DATA:  Gallbladder cancer status post cholecystectomy and wedge resection. EXAM: CT ABDOMEN AND PELVIS WITH CONTRAST TECHNIQUE: Multidetector CT imaging of the abdomen and pelvis was performed using the standard protocol following bolus administration of intravenous contrast. CONTRAST:  152m OMNIPAQUE IOHEXOL 300 MG/ML  SOLN COMPARISON:  CT abdomen pelvis dated 05/22/2015. FINDINGS: Lower chest: Stable branching nodularity at the lateral right lung base (series 4/image 4). Additional 5 mm nodule in the medial left lower lobe (series 4/image 5), unchanged. Hepatobiliary: 2.6 x 3.1 cm hypoenhancing lesion in segment 4A of the liver (series 2/image 10). Additional 1.7 x 1.6 cm hypoenhancing lesion inferiorly in segment 4A (series 2/ image 15). Although difficult to directly compare on the prior unenhanced CT, these lesions have progressed, previously measuring 1.7 and 0.8 cm respectively. Status post cholecystectomy with  central hepatic wedge resection. Periportal edema. Pneumobilia. No extrahepatic ductal dilatation. Pancreas: Within normal limits. Spleen: Within normal limits. Adrenals/Urinary Tract: Adrenal glands are within normal limits. Kidneys are within normal limits. Bilateral renal sinus cysts. No hydronephrosis. Bladder is thick-walled although underdistended Stomach/Bowel: Stomach is within normal limits. No evidence of bowel obstruction. Appendix is not discretely visualized. Vascular/Lymphatic: Atherosclerotic calcifications of the abdominal aorta and branch vessels. No evidence of abdominal aortic aneurysm. 13 mm short axis node in the porta hepatis (series 2/image 16), previously 12 mm. Additional vague soft tissue stranding in the right upper quadrant/surgical bed (series 2/ image 21), nonspecific. Reproductive: Prostate is grossly unremarkable. Other: Small volume abdominopelvic ascites. No gross peritoneal nodularity. Musculoskeletal: Degenerative changes of the visualized thoracolumbar spine. IMPRESSION: Status post cholecystectomy with partial hepatectomy. Progressive hepatic metastases, measuring up to 3.1 cm in segment 4A, as above. 13 mm short axis node in the porta hepatis, grossly unchanged. Small volume abdominopelvic ascites. No gross peritoneal nodularity. Additional ancillary findings as above. Electronically Signed   By: SJulian HyM.D.   On: 07/31/2015 11:07    CT images reviewed with Mr. Rutan and his wife Medications: I have reviewed the patient's current medications.  Assessment/Plan: 1. 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, M0), status post a cholecystectomy 06/24/2014 with a 2.5 cm gallbladder mucosal mass with extension into attached hepatic parenchyma, positive hepatic parenchymal and cystic duct margins, diffuse lymphovascular invasion  Elevated CA 19-9  Partial liver resection, bile duct resection, lymph node resection on 08/01/2014  with the pathology confirming residual invasive adenocarcinoma in the liver-negative margins, metastatic adenocarcinoma with extranodal extension involving one lymph node  Final pathologic stage IIIB (pT3,pN1)  Initiation of adjuvant gemcitabine 09/05/2014 (09/05/2014, 09/12/2014, 09/19/2014)  Initiation of adjuvant radiation/Xeloda 10/07/2014; completed 11/11/2014  Resumption of adjuvant gemcitabine 11/28/2014, completed 02/27/2015  Elevated CA 19-9 05/05/2015  CT 05/22/2015 with a vague hypoattenuating lesion in segment 4 of the liver  Ultrasound 06/03/2015 revealed a hypoechoic focus in the central liver, possibly corresponding to the CT finding from 05/22/2015   CT 07/31/2015 confirmed enlargement of 2 liver lesions  2. Intermittent right upper abdominal pain beginning in the summer of 2015-likely related to symptomatic cholelithiasis. Improved.  3. History of colon polyps  4. Family history of multiple cancers including colon cancer, status post a genetics evaluation. Positive for the BRCA1 mutation.  5. Elevated liver enzymes 09/07/2014; persistent  6. Upper abdomen/mid back pain-etiology unclear , improved with Protonix  7. DVT right arm 07/09/2015.  Now on Coumadin    Disposition:   Mr. Poteete appears well. The CT reveals evidence of progressive metastatic liver lesions. He likely has metastatic gallbladder cancer. He appears asymptomatic at present. He and his wife are concerned there is been no explanation for the upper abdominal discomfort   And nausea that were relieved with Protonix. He was also noted to have anemia on 07/21/2015. The  hemoglobin has improved with iron. They would like to see Dr. Carlean Purl to consider an upper endoscopy.  We discussed the likely diagnosis of metastatic gallbladder cancer and treatment options. No therapy will be curative. He would have to come off of Coumadin for a biopsy. I recommend observation as systemic treatment  options are limited. He is in agreement.  Mr. Culbreath will return for an office and lab visit in 6 weeks.  Betsy Coder, MD  08/01/2015  3:13 PM

## 2015-08-02 LAB — CANCER ANTIGEN 19-9 (PARALLEL TESTING): CA 19-9: 99 U/mL — ABNORMAL HIGH

## 2015-08-02 LAB — CANCER ANTIGEN 19-9: CA 19-9: 139 U/mL — ABNORMAL HIGH (ref 0–35)

## 2015-08-05 ENCOUNTER — Telehealth: Payer: Self-pay

## 2015-08-05 ENCOUNTER — Telehealth: Payer: Self-pay | Admitting: *Deleted

## 2015-08-05 ENCOUNTER — Ambulatory Visit (INDEPENDENT_AMBULATORY_CARE_PROVIDER_SITE_OTHER): Payer: Medicare Other

## 2015-08-05 ENCOUNTER — Other Ambulatory Visit: Payer: Self-pay

## 2015-08-05 ENCOUNTER — Other Ambulatory Visit: Payer: Self-pay | Admitting: *Deleted

## 2015-08-05 DIAGNOSIS — D649 Anemia, unspecified: Secondary | ICD-10-CM

## 2015-08-05 DIAGNOSIS — Z7901 Long term (current) use of anticoagulants: Secondary | ICD-10-CM

## 2015-08-05 DIAGNOSIS — R195 Other fecal abnormalities: Secondary | ICD-10-CM

## 2015-08-05 LAB — POCT INR: INR: 2.5

## 2015-08-05 MED ORDER — PANTOPRAZOLE SODIUM 40 MG PO TBEC: 40.0000 mg | DELAYED_RELEASE_TABLET | Freq: Every day | ORAL | Status: AC

## 2015-08-05 NOTE — Telephone Encounter (Signed)
"  Ronnie Nicholson calling for Ronnie Nicholson.  We need protonix refill sent to [facility].  Pharmacy has been awaiting authorized refill."  Voicemail forwarded to [phone].

## 2015-08-05 NOTE — Telephone Encounter (Signed)
Patient is scheudled for [facility] 08/13/15 9:30.  He is notified to remain on his coumadin, but hold his iron starting today.  He verbalized understanding. Instructions mailed to the patient

## 2015-08-05 NOTE — Telephone Encounter (Signed)
-----   Message from Gatha Mayer, MD sent at 08/05/2015 11:15 AM EST ----- Regarding: EGD recommended Let patient and wife know that I have seen the recent CT and heard from Dr. Benay Spice  I think its reasonable to have him do an EGD to evaluate heme + stool/anemia - could do at hospital next week somewhere - MAC not required  Could do Tues AM at [facility] - have a a 130 at [facility] but whatever works ok except avoid Monday  [place]

## 2015-08-05 NOTE — Telephone Encounter (Signed)
Message from pt's wife requesting refill on Protonix. Also received fax from pharmacy. Rx refilled electronically per Dr. Benay Spice.

## 2015-08-13 ENCOUNTER — Encounter (HOSPITAL_COMMUNITY): Payer: Self-pay

## 2015-08-13 ENCOUNTER — Ambulatory Visit (HOSPITAL_COMMUNITY)
Admission: RE | Admit: 2015-08-13 | Discharge: 2015-08-13 | Disposition: A | Discharge status: Home / Self Care | Payer: Medicare Other | Source: Ambulatory Visit | Attending: Internal Medicine | Admitting: Internal Medicine

## 2015-08-13 ENCOUNTER — Encounter (HOSPITAL_COMMUNITY)
Admission: RE | Disposition: A | Discharge status: Home / Self Care | Payer: Self-pay | Source: Ambulatory Visit | Attending: Internal Medicine

## 2015-08-13 DIAGNOSIS — Z9049 Acquired absence of other specified parts of digestive tract: Secondary | ICD-10-CM | POA: Diagnosis not present

## 2015-08-13 DIAGNOSIS — K3189 Other diseases of stomach and duodenum: Secondary | ICD-10-CM | POA: Diagnosis not present

## 2015-08-13 DIAGNOSIS — D649 Anemia, unspecified: Secondary | ICD-10-CM | POA: Diagnosis not present

## 2015-08-13 DIAGNOSIS — D509 Iron deficiency anemia, unspecified: Secondary | ICD-10-CM | POA: Diagnosis present

## 2015-08-13 DIAGNOSIS — Z923 Personal history of irradiation: Secondary | ICD-10-CM | POA: Diagnosis not present

## 2015-08-13 DIAGNOSIS — I82621 Acute embolism and thrombosis of deep veins of right upper extremity: Secondary | ICD-10-CM | POA: Insufficient documentation

## 2015-08-13 DIAGNOSIS — K315 Obstruction of duodenum: Secondary | ICD-10-CM | POA: Insufficient documentation

## 2015-08-13 DIAGNOSIS — C170 Malignant neoplasm of duodenum: Secondary | ICD-10-CM | POA: Insufficient documentation

## 2015-08-13 DIAGNOSIS — K31819 Angiodysplasia of stomach and duodenum without bleeding: Secondary | ICD-10-CM | POA: Insufficient documentation

## 2015-08-13 DIAGNOSIS — Z9221 Personal history of antineoplastic chemotherapy: Secondary | ICD-10-CM | POA: Diagnosis not present

## 2015-08-13 DIAGNOSIS — R195 Other fecal abnormalities: Secondary | ICD-10-CM | POA: Insufficient documentation

## 2015-08-13 DIAGNOSIS — Z8509 Personal history of malignant neoplasm of other digestive organs: Secondary | ICD-10-CM | POA: Diagnosis not present

## 2015-08-13 HISTORY — PX: ESOPHAGOGASTRODUODENOSCOPY: SHX5428

## 2015-08-13 SURGERY — EGD (ESOPHAGOGASTRODUODENOSCOPY)
Anesthesia: Moderate Sedation

## 2015-08-13 MED ORDER — MIDAZOLAM HCL 5 MG/ML IJ SOLN
INTRAMUSCULAR | Status: AC
  Filled 2015-08-13: qty 2

## 2015-08-13 MED ORDER — FENTANYL CITRATE (PF) 100 MCG/2ML IJ SOLN
INTRAMUSCULAR | Status: DC | PRN
  Administered 2015-08-13 (×2): 25 ug via INTRAVENOUS

## 2015-08-13 MED ORDER — FENTANYL CITRATE (PF) 100 MCG/2ML IJ SOLN
INTRAMUSCULAR | Status: AC
  Filled 2015-08-13: qty 2

## 2015-08-13 MED ORDER — BUTAMBEN-TETRACAINE-BENZOCAINE 2-2-14 % EX AERO
INHALATION_SPRAY | CUTANEOUS | Status: DC | PRN
  Administered 2015-08-13: 2 via TOPICAL

## 2015-08-13 MED ORDER — SODIUM CHLORIDE 0.9 % IV SOLN
INTRAVENOUS | Status: DC
  Administered 2015-08-13: 09:00:00 via INTRAVENOUS

## 2015-08-13 MED ORDER — MIDAZOLAM HCL 10 MG/2ML IJ SOLN
INTRAMUSCULAR | Status: DC | PRN
  Administered 2015-08-13 (×2): 2 mg via INTRAVENOUS
  Administered 2015-08-13: 1 mg via INTRAVENOUS

## 2015-08-13 NOTE — Interval H&P Note (Signed)
History and Physical Interval Note:  08/13/2015 9:19 AM  [name]  has presented today for surgery, with the diagnosis of heme positive stool anemia  The various methods of treatment have been discussed with the patient and family. After consideration of risks, benefits and other options for treatment, the patient has consented to  Procedure(s): ESOPHAGOGASTRODUODENOSCOPY (EGD) (N/A) as a surgical intervention .  The patient's history has been reviewed, patient examined, no change in status, stable for surgery.  I have reviewed the patient's chart and labs.  Questions were answered to the patient's satisfaction.     Ronnie Nicholson

## 2015-08-13 NOTE — Op Note (Addendum)
[facility] Patient Name: Ronnie Nicholson Procedure Date: 08/13/2015 MRN: [id number] Attending MD: Gatha Mayer , MD Date of Birth: 30-Jun-1940 CSN:  Age: 75 Admit Type: Outpatient Account #: [id number] Procedure:            Upper GI endoscopy Indications:          Iron deficiency anemia, Heme positive stool Providers:            Gatha Mayer, MD, Malka So, RN, Cletis [place],                        Technician Referring MD:          Medicines:            Midazolam 5 mg IV, Fentanyl 50 micrograms IV, Cetacaine                        spray Complications:        No immediate complications. Estimated Blood Loss: Estimated blood loss was minimal. Procedure:            Pre-Anesthesia Assessment:                       - Prior to the procedure, a History and Physical was                        performed, and patient medications and allergies were                        reviewed. The patient's tolerance of previous                        anesthesia was also reviewed. The risks and benefits of                        the procedure and the sedation options and risks were                        discussed with the patient. All questions were                        answered, and informed consent was obtained. Prior                        Anticoagulants: The patient last took Coumadin                        (warfarin) on the day of the procedure. ASA Grade                        Assessment: III - A patient with severe systemic                        disease. After reviewing the risks and benefits, the                        patient was deemed in satisfactory condition to undergo                        the procedure.  After obtaining informed consent, the endoscope was                        passed under direct vision. Throughout the procedure,                        the patient's blood pressure, pulse, and oxygen                        saturations were  monitored continuously. The EG-[id number]                        [id number] scope was introduced through the mouth, and                        advanced to the second part of duodenum. The [id number]                        [id number] scope was introduced through the mouth, and                        advanced to the second part of duodenum. The upper GI                        endoscopy was accomplished without difficulty. The                        patient tolerated the procedure fairly well. Scope In: Scope Out: Findings:      The examined esophagus was normal.      Diffuse mucosal changes characterized by an increased vascular pattern       were found in the gastric antrum. Biopsies were taken with a cold       forceps for histology.      An acquired severe stenosis was found in the second portion of the       duodenum and was non-traversed. Biopsies were taken with a cold forceps       for histology.      The exam was otherwise without abnormality. Impression:           - Normal esophagus.                       - Increased vascular pattern mucosa in the antrum.                        Biopsied. ? gastritis vs GAVE                       - Acquired duodenal stenosis. Biopsied. looks benign to                        me but given hx cholangiocarcinoma ? malignancy ? XRT                       - The examination was otherwise normal. Moderate Sedation:      Moderate (conscious) sedation was administered by the endoscopy nurse       and supervised by the endoscopist. The following parameters were       monitored: oxygen saturation, heart rate, blood pressure, respiratory  rate, EKG, adequacy of pulmonary ventilation, and response to care.       Total physician intraservice time was 12 minutes. Recommendation:       - Patient has a contact number available for                        emergencies. The signs and symptoms of potential                        delayed complications were discussed with  the patient.                        Return to normal activities tomorrow. Written discharge                        instructions were provided to the patient.                       - Resume previous diet.                       - Continue present medications.                       - Await pathology results.                       - Resume Coumadin (warfarin) at prior dose today. Procedure Code(s):    --- Professional ---                       [id number], Esophagogastroduodenoscopy, flexible, transoral;                        with biopsy, single or multiple                       G0500, Moderate sedation services provided by the same                        physician or other qualified health care professional                        performing a gastrointestinal endoscopic service that                        sedation supports, requiring the presence of an                        independent trained observer to assist in the                        monitoring of the patient's level of consciousness and                        physiological status; initial 15 minutes of                        intra-service time; patient age 60 years or older                        (additional time may be reported with [phone], as  appropriate) Diagnosis Code(s):    --- Professional ---                       K31.89, Other diseases of stomach and duodenum                       K31.5, Obstruction of duodenum                       D50.9, Iron deficiency anemia, unspecified                       R19.5, Other fecal abnormalities CPT copyright 2016 American Medical Association. All rights reserved. The codes documented in this report are preliminary and upon coder review may  be revised to meet current compliance requirements. Attending Participation: Gatha Mayer, MD Gatha Mayer, MD 08/13/2015 10:00:41 AM Number of Addenda: 1 Addendum Number: 1   Addendum Date: 08/13/2015 10:12:20 AM      low fiber  diet advised and instructions given. Gatha Mayer, MD Gatha Mayer, MD 08/13/2015 10:12:45 AM

## 2015-08-13 NOTE — Discharge Instructions (Addendum)
°  YOU HAD AN ENDOSCOPIC PROCEDURE TODAY: Refer to the procedure report and other information in the discharge instructions given to you for any specific questions about what was found during the examination. If this information does not answer your questions, please call Dr. Celesta Aver office at [phone] to clarify.   YOU SHOULD EXPECT: Some feelings of bloating in the abdomen. Passage of more gas than usual. Walking can help get rid of the air that was put into your GI tract during the procedure and reduce the bloating. If you had a lower endoscopy (such as a colonoscopy or flexible sigmoidoscopy) you may notice spotting of blood in your stool or on the toilet paper. Some abdominal soreness may be present for a day or two, also.  DIET: Your first meal following the procedure should be a light meal and then it is ok to progress to your normal diet. A half-sandwich or bowl of soup is an example of a good first meal. Heavy or fried foods are harder to digest and may make you feel nauseous or bloated. Drink plenty of fluids but you should avoid alcoholic beverages for 24 hours. USE LOW FIBER DIET  ACTIVITY: Your care partner should take you home directly after the procedure. You should plan to take it easy, moving slowly for the rest of the day. You can resume normal activity the day after the procedure however YOU SHOULD NOT DRIVE, use power tools, machinery or perform tasks that involve climbing or major physical exertion for 24 hours (because of the sedation medicines used during the test).   SYMPTOMS TO REPORT IMMEDIATELY: A gastroenterologist can be reached at any hour. Please call [phone]  for any of the following symptoms:  Following lower endoscopy (colonoscopy, flexible sigmoidoscopy) Excessive amounts of blood in the stool  Significant tenderness, worsening of abdominal pains  Swelling of the abdomen that is new, acute  Fever of 100 or higher  Following upper endoscopy (EGD, EUS,  ERCP, esophageal dilation) Vomiting of blood or coffee ground material  New, significant abdominal pain  New, significant chest pain or pain under the shoulder blades  Painful or persistently difficult swallowing  New shortness of breath  Black, tarry-looking or red, bloody stools  FOLLOW UP:  If any biopsies were taken you will be contacted by phone or by letter within the next 1-3 weeks. Call [phone]  if you have not heard about the biopsies in 3 weeks.  Please also call with any specific questions about appointments or follow up tests.

## 2015-08-13 NOTE — H&P (View-Only) (Signed)
Subjective:    Patient ID: Ronnie Nicholson, male    DOB: 22-Mar-1941, 75 y.o.   MRN: [id number] Chief complaint: Heme positive stool and anemia HPI Patient is here with his wife, is a 75 year old white man I know from previous colonoscopy last in 2013 with hemorrhoids and small adenomas, he was diagnosed with gallbladder cancer after a cholecystectomy for stones in late 2015 early 2016. Subsequently had a more aggressive wedge resection by Dr. Barry Dienes. He has been followed by Dr. Geanie Cooley also. He has had radiation and chemotherapy for this cancer. His wife gives most of the history apparently is having a lot of abdominal pain off and on since the surgery he has been unable to regain weight and has been a lot of fatigue. About 2 weeks ago he was started on PPI with pantoprazole by Dr. Ammie Dalton and has been much better. Around this time. He also developed a right upper extremity DVT in was started on Lovenox and warfarin. Dr. Wolfgang Phoenix had seen him and he was anemic and Hemoccult cards were presented and at least one of these was positive. That is why is here today with the heme positive anemia his last hemoglobin was 10.9, it was 12 on January 10. Other than the heme positive stool there's been no sign of bleeding. No melena or bright red blood per rectum. Other significant laboratory studies show that he has a rising CA-19-9. The alkaline phosphatase is rising as well. CT scanning in December demonstrated a vague low-attenuation lesion in segment 4A of the liver and a small amount of free abdominal and pelvic fluid. A follow-up CT scan is scheduled for February 23. It looks like, from chart review the patient is not able to undergo MRI due to metal in the year area from a prior year surgery.  Medications, allergies, past medical history, past surgical history, family history and social history are reviewed and updated in the EMR.  Review of Systems As per history of present illness. He wears  eyeglasses. Ever since the surgery his bowel movements have been smaller and more difficult to cleanse any also has frequent belching. All other review of systems are negative.    Objective:   Physical Exam @BP  118/60 mmHg  Pulse 76  Ht 6\' 1"  (1.854 m)  Wt 177 lb 6.4 oz (80.468 kg)  BMI 23.41 kg/m2@  General:  Well-developed, well-nourished and in no acute distress Eyes:  anicteric. ENT:   Mouth and posterior pharynx free of lesions.  Neck:   supple w/o thyromegaly or mass.  Lungs: Clear to auscultation bilaterally. Heart:  S1S2, no rubs, murmurs, gallops. Abdomen:  soft, non-tender, no hepatosplenomegaly, hernia, or mass and BS+. There is a small ecchymotic area in the right mid quadrant from prior Lovenox injection Extremities:   no edema, cyanosis or clubbing Skin   no rash. Perhaps slightly pale Neuro:  A&O x 3.  Psych:  appropriate mood and  Affect.   Data Reviewed: As per history of present illness guarding labs endoscopic studies CT scanning and I looked at the images of 05/22/15 CT abd/pelvis Also reviewed recent oncology and primary care notes  Lab Results  Component Value Date   INR 2.4 07/21/2015   INR 1.8 07/17/2015   INR 1.10* 07/14/2015   PROTIME 13.2 07/14/2015       Assessment & Plan:   1. Heme + stool   2. Chronic anemia   3. Gallbladder cancer ([place])    Gallbladder cancer he summary  as follows: 1. 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, M0), status post a cholecystectomy 06/24/2014 with a 2.5 cm gallbladder mucosal mass with extension into attached hepatic parenchyma, positive hepatic parenchymal and cystic duct margins, diffuse lymphovascular invasion  Elevated CA 19-9  Partial liver resection, bile duct resection, lymph node resection on 08/01/2014 with the pathology confirming residual invasive adenocarcinoma in the liver-negative margins, metastatic adenocarcinoma with extranodal extension involving one lymph  node  Final pathologic stage IIIB (pT3,pN1)  Initiation of adjuvant gemcitabine 09/05/2014 (09/05/2014, 09/12/2014, 09/19/2014)  Initiation of adjuvant radiation/Xeloda 10/07/2014; completed 11/11/2014  Resumption of adjuvant gemcitabine 11/28/2014, completed 02/27/2015  Elevated CA 19-9 05/05/2015  CT 05/22/2015 with a vague hypoattenuating lesion in segment 4 of the liver  Ultrasound 06/03/2015 revealed a hypoechoic focus in the central liver, possibly corresponding to the CT finding from 05/22/2015  Heme Positive anemia has multiple possible sources. Could be a false positive occult blood test, he did have hemorrhoids and was started on Lovenox and warfarin around the time of this testing. What seems most likely unfortunately has progressive recurrent disease from the gallbladder cancer. He could have loss of blood through the bile ducts going to the intestine. There could be gastrointestinal metastases at this point. He could've had peptic ulcer disease or gastritis as well causing a heme positive stool, he seems better on pantoprazole.  Endoscopic evaluation is possibly needed but I think it makes sense to wait until the CT scan is completed next week which could lead me in one direction or the other. If he needs a colonoscopy I think it would be prudent to try to hold his warfarin most likely depending upon what would be seen, but for an upper endoscopy I don't think I need to hold his warfarin. Pending upon the CT imaging, endoscopic evaluation could even be a moot point if there are metastases that give [place] an answer that is plausible. I will coordinate with Dr. Ammie Dalton.  He has been started on iron supplementation by Dr. Wolfgang Phoenix. MCV was 93 in Jan.  I have explained  all this to the patient and his wife.  I appreciate the opportunity to care for this patient. CC: Mickie Hillier, MD Geanie Cooley M.D.

## 2015-08-18 ENCOUNTER — Other Ambulatory Visit: Payer: Self-pay

## 2015-08-18 DIAGNOSIS — K56699 Other intestinal obstruction unspecified as to partial versus complete obstruction: Secondary | ICD-10-CM

## 2015-08-18 NOTE — Progress Notes (Signed)
Quick Note:  I called wife and explained he has cancer in the duodenum not radiation stricture like I thought. 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 Ardis Hughs - I have alredy messaged Dr. Benay Spice about path ______

## 2015-08-20 ENCOUNTER — Ambulatory Visit: Payer: Medicare Other | Admitting: Family Medicine

## 2015-08-21 ENCOUNTER — Ambulatory Visit (HOSPITAL_COMMUNITY)
Admission: RE | Admit: 2015-08-21 | Discharge: 2015-08-21 | Disposition: A | Discharge status: Home / Self Care | Payer: Medicare Other | Source: Ambulatory Visit | Attending: Internal Medicine | Admitting: Internal Medicine

## 2015-08-21 DIAGNOSIS — K56699 Other intestinal obstruction unspecified as to partial versus complete obstruction: Secondary | ICD-10-CM

## 2015-08-21 DIAGNOSIS — K219 Gastro-esophageal reflux disease without esophagitis: Secondary | ICD-10-CM | POA: Insufficient documentation

## 2015-08-21 DIAGNOSIS — K5669 Other intestinal obstruction: Secondary | ICD-10-CM | POA: Diagnosis present

## 2015-08-21 DIAGNOSIS — Z9049 Acquired absence of other specified parts of digestive tract: Secondary | ICD-10-CM | POA: Insufficient documentation

## 2015-08-21 NOTE — Progress Notes (Signed)
Quick Note:  I think I see the stricture though radiologist did not comment specifically on it the post-bulbar duodenum looks narrowed. Let patient/wife know this info and that Dr. Benay Spice will review things at upcoming appointment and work with me/Dr. Ardis Hughs to determine next steps.  We need to know if he is having worsening abdominal pain, vomiting, etc.  I am out next week FYI to all  I am ccing Dr. Benay Spice and Merceda Elks ______

## 2015-08-25 ENCOUNTER — Encounter: Payer: Self-pay | Admitting: *Deleted

## 2015-08-25 ENCOUNTER — Telehealth: Payer: Self-pay | Admitting: *Deleted

## 2015-08-25 NOTE — Telephone Encounter (Signed)
  Oncology Nurse Navigator Documentation  Navigator Location: [facility] (08/25/15 1200) Navigator Encounter Type: Telephone (08/25/15 1200) Telephone: Outgoing Call;Diagnostic Results (08/25/15 1200): Called wife to inform her that Dr. Cecilie Kicks had forwarded the UGI/KUB results to Dr. Benay Spice. Unless there is a change in how he feels, it is reasonable to keep his 09/11/15 appointment, unless they are uncomfortable with this and wish to be seen soon. She says he is doing well if he eats correctly and is OK to wait. Dr. Carlean Purl is having Dr. Ardis Hughs consider an EUS. She knows to call with increased pain or vomiting.                                        Time Spent with Patient: 15 (08/25/15 1200)

## 2015-08-25 NOTE — Telephone Encounter (Signed)
"  This is Dang Corpus calling navigator in reference to [employer].  We spoke earlier today."  Call transferred to ext [phone].

## 2015-08-25 NOTE — Telephone Encounter (Signed)
Wife returned call and is asking for an appointment to see Dr. Burnard Hawthorne available. MD notified.

## 2015-08-25 NOTE — Telephone Encounter (Signed)
Left VM that Dr. Benay Spice will see him on 3/23 at 0900. Requested return call or mychart message to confirm.

## 2015-08-26 ENCOUNTER — Ambulatory Visit (INDEPENDENT_AMBULATORY_CARE_PROVIDER_SITE_OTHER): Payer: Medicare Other | Admitting: Family Medicine

## 2015-08-26 ENCOUNTER — Ambulatory Visit: Payer: Medicare Other

## 2015-08-26 ENCOUNTER — Encounter: Payer: Self-pay | Admitting: Family Medicine

## 2015-08-26 VITALS — BP 110/70 | Ht 73.0 in | Wt 179.0 lb

## 2015-08-26 DIAGNOSIS — I82621 Acute embolism and thrombosis of deep veins of right upper extremity: Secondary | ICD-10-CM

## 2015-08-26 DIAGNOSIS — Z7901 Long term (current) use of anticoagulants: Secondary | ICD-10-CM | POA: Diagnosis not present

## 2015-08-26 DIAGNOSIS — I10 Essential (primary) hypertension: Secondary | ICD-10-CM | POA: Diagnosis not present

## 2015-08-26 DIAGNOSIS — C801 Malignant (primary) neoplasm, unspecified: Secondary | ICD-10-CM

## 2015-08-26 DIAGNOSIS — D649 Anemia, unspecified: Secondary | ICD-10-CM

## 2015-08-26 LAB — POCT HEMOGLOBIN: Hemoglobin: 10.1 g/dL — AB (ref 14.1–18.1)

## 2015-08-26 LAB — POCT INR: INR: 2.7

## 2015-08-26 NOTE — Progress Notes (Signed)
   Subjective:    Patient ID: Ronnie Nicholson, male    DOB: 1941/02/05, 75 y.o.   MRN: [id number] Patient once again comes in with spouse for an extremely protracted discussion regarding all of his health issues next   HPI Patient returns to the office for a follow up on challenges with his ongoing clinical situation. Next  Patient has not seen any blood in his stools. Taking iron supplement faithfully.  Claims compliance to Coumadin does not miss a dose. Next  Notes his right arm overall is improved. No chest pain no shortness breath next  Ongoing epigastric pain  Patient asked that I review records it of occurred since last visit. This took: Own at least 10-15 minutes while the patient was in the room. Extensive interventions and and inputs since our last office visit together  Patient states that his stomach cancer has moved to his duodenum. Patient has some questions about this diagnose.   Please complete INR sheet with new regimen in front of blue folder.  Results for orders placed or performed in visit on 08/26/15  POCT hemoglobin  Result Value Ref Range   Hemoglobin 10.1 (A) 14.1 - 18.1 g/dL  POCT INR  Result Value Ref Range   INR 2.7    Review of Systems No shortness of breath no rash no change in bowel habits no obvious blood in stool    Objective:   Physical Exam  Alert vital stable no acute distress lungs clear heart rhythm epigastrium right upper quadrant positive tenderness      Assessment & Plan:  Impression #1 metastatic gallbladder cancer. Now with apparent involvement in the duodenum. Patient starting have functional issues with this along with pain. #2 numerous questions asked regarding patient's condition I did my best to answer them #3 anemia hemoglobin has dropped another gram and a half since last visit #4 anticoagulation stable #5 and of the life issues, if it comes to this eventually regarding current condition which is likely, discussed at great  length plan 40 minutes spent most in discussion. Follow-up as scheduled. Repeat hemoglobin and INR just 2 weeks. Follow-up with oncologist and gastroenterologist as scheduled. We really need at this time work through the specialists inputs on all this WSL

## 2015-08-28 ENCOUNTER — Telehealth: Payer: Self-pay | Admitting: Oncology

## 2015-08-28 ENCOUNTER — Telehealth: Payer: Self-pay | Admitting: *Deleted

## 2015-08-28 ENCOUNTER — Ambulatory Visit (HOSPITAL_BASED_OUTPATIENT_CLINIC_OR_DEPARTMENT_OTHER): Payer: Medicare Other | Admitting: Oncology

## 2015-08-28 ENCOUNTER — Encounter: Payer: Self-pay | Admitting: *Deleted

## 2015-08-28 VITALS — BP 124/51 | HR 65 | Temp 98.2°F | Resp 17 | Ht 73.0 in | Wt 177.6 lb

## 2015-08-28 DIAGNOSIS — R109 Unspecified abdominal pain: Secondary | ICD-10-CM | POA: Diagnosis not present

## 2015-08-28 DIAGNOSIS — R1110 Vomiting, unspecified: Secondary | ICD-10-CM

## 2015-08-28 DIAGNOSIS — C787 Secondary malignant neoplasm of liver and intrahepatic bile duct: Secondary | ICD-10-CM | POA: Diagnosis not present

## 2015-08-28 DIAGNOSIS — C23 Malignant neoplasm of gallbladder: Secondary | ICD-10-CM | POA: Diagnosis present

## 2015-08-28 NOTE — Progress Notes (Signed)
Ronnie Nicholson   Diagnosis: Gallbladder cancer  INTERVAL HISTORY:   Ronnie Nicholson returns prior to a scheduled visit. He was taken to an upper endoscopy by Dr. Carlean Purl on 08/13/2015 for evaluation of the heme-positive stool and upper gastrointestinal symptoms. 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 This was biopsied. The pathology confirmed adenocarcinoma.  He continues to have intermittent upper abdominal discomfort. He developed intense pain lasting for several hours after eating pizza and again after eating salad. He vomited on both occasions and the pain was relieved.  He has a good energy level and is working around his home. He continues Coumadin anticoagulation. The right arm swelling has resolved. No gross bleeding. He is taking iron.  Objective:  Vital signs in last 24 hours:  Blood pressure 124/51, pulse 65, temperature 98.2 F (36.8 C), temperature source Oral, resp. rate 17, height 6' 1" (1.854 m), weight 177 lb 9.6 oz (80.559 kg), SpO2 100 %.   Resp: Lungs clear bilaterally Cardio: Regular rate and rhythm GI: No hepatomegaly, nontender, no mass Vascular: No leg edema   Portacath/PICC-without erythema  Lab Results:  Lab Results  Component Value Date   WBC 5.8 08/01/2015   HGB 10.1* 08/26/2015   HCT 36.0* 08/01/2015   MCV 93.4 08/01/2015   PLT 163 08/01/2015   NEUTROABS 4.4 08/01/2015     Medications: I have reviewed the patient's current medications.  Assessment/Plan: 1. 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, M0), status post a cholecystectomy 06/24/2014 with a 2.5 cm gallbladder mucosal mass with extension into attached hepatic parenchyma, positive hepatic parenchymal and cystic duct margins, diffuse lymphovascular invasion  Elevated CA 19-9  Partial liver resection, bile duct resection, lymph node resection on 08/01/2014 with the pathology confirming  residual invasive adenocarcinoma in the liver-negative margins, metastatic adenocarcinoma with extranodal extension involving one lymph node  Final pathologic stage IIIB (pT3,pN1)  Initiation of adjuvant gemcitabine 09/05/2014 (09/05/2014, 09/12/2014, 09/19/2014)  Initiation of adjuvant radiation/Xeloda 10/07/2014; completed 11/11/2014  Resumption of adjuvant gemcitabine 11/28/2014, completed 02/27/2015  Elevated CA 19-9 05/05/2015  CT 05/22/2015 with a vague hypoattenuating lesion in segment 4 of the liver  Ultrasound 06/03/2015 revealed a hypoechoic focus in the central liver, possibly corresponding to the CT finding from 05/22/2015  CT 07/31/2015 confirmed enlargement of 2 liver lesions  Upper endoscopy 08/13/2015 revealed a stricture in the second portion of the duodenum, biopsy confirmed adenocarcinoma  2. Intermittent right upper abdominal pain beginning in the summer of 2015-likely related to symptomatic cholelithiasis. Improved.  3. History of colon polyps  4. Family history of multiple cancers including colon cancer, status post a genetics evaluation. Positive for the BRCA1 mutation.  5. Elevated liver enzymes 09/07/2014; persistent  6. Upper abdomen/mid back pain-improved with Protonix, likely related to upper abdominal tumor/duodenal obstruction  7. DVT right arm 07/09/2015. Now on Coumadin  8.   Anemia-likely secondary to chronic disease and chronic GI blood loss   Disposition:  Ronnie Nicholson has metastatic gallbladder carcinoma. 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 No therapy will be curative.  We discussed the expected response rate associated with systemic therapy. I explained the lack of a proven survival benefit with systemic chemotherapy in this setting. We discussed gemcitabine/cisplatin and FOLFOX chemotherapy. We also reviewed the option of supportive care reviewed  He appears to be symptomatic  with intermittent obstructive symptoms. He is following a mechanical soft/low residual  diet. I will contact Dr. Carlean Purl to discuss the indication for a duodenal stent.  Ronnie Nicholson is unsure whether he would like to proceed with a trial of systemic therapy versus comfort care. He would like to seek a second opinion. I will make a referral to the GI oncology service at [facility]  He will return for an office visit here in 2 weeks.  Ronnie Coder, MD  08/28/2015  2:12 PM

## 2015-08-28 NOTE — Telephone Encounter (Signed)
Oncology Nurse Navigator Documentation  Oncology Nurse Navigator Flowsheets 08/28/2015  Navigator Location [facility]  Navigator Encounter Type Letter/Fax/Email;Telephone  Telephone -  Patient Visit Type -  Treatment Phase -  Barriers/Navigation Needs Coordination of Care--referral to [facility]-medical oncology GI  Interventions Coordination of Care--spoke with referral coordinator and sent records -she will call back w/appointment   Coordination of Care Appts  Time Spent with Patient 30  Time Spent with Patient (Retired) -  Fax # [phone]

## 2015-08-28 NOTE — Telephone Encounter (Signed)
Gave and printed appt sched and avs for pt for April °

## 2015-09-01 ENCOUNTER — Telehealth: Payer: Self-pay | Admitting: Internal Medicine

## 2015-09-01 ENCOUNTER — Telehealth: Payer: Self-pay | Admitting: *Deleted

## 2015-09-01 NOTE — Telephone Encounter (Signed)
Called and left message that I would recontact re: duodenal stricture treatment.

## 2015-09-01 NOTE — Telephone Encounter (Signed)
Left VM at [facility] new patient referral line to follow up on status of new patient referral.

## 2015-09-02 ENCOUNTER — Encounter: Payer: Self-pay | Admitting: *Deleted

## 2015-09-02 NOTE — Telephone Encounter (Signed)
You may call patient at # [phone] if you can't reach at home.

## 2015-09-02 NOTE — Telephone Encounter (Signed)
3rd call to follow up on referral to [place] oncology. Was informed by Horris Latino, that he is the next case she will be working on. She will call tomorrow with appointment. Provided direct # for her to call tomorrow.

## 2015-09-03 ENCOUNTER — Encounter: Payer: Self-pay | Admitting: *Deleted

## 2015-09-03 ENCOUNTER — Telehealth: Payer: Self-pay | Admitting: Family Medicine

## 2015-09-03 NOTE — Telephone Encounter (Signed)
Pt is going to have a procedure done on Tues and is unable to keep his inr appt. Wife states that because of the procedure the Dr is going to take him off the blood thinner several days before the procedure. Pt's wife wants to know when is the best time to reschedule his inr. Please advise.

## 2015-09-03 NOTE — Telephone Encounter (Signed)
Wife states that it is for a duod stricture stent. She states that the specialist never mentioned anything about "bridge therapy". Contacted Dr. Gearldine Shown nurse and she stated that she will give [place] a call back after she talks to Dr. Benay Spice today concerning this.

## 2015-09-03 NOTE — Telephone Encounter (Signed)
I presume this for duod stricture stent? Did they state whether he should receive "bridge therapy"? This is where the coumadin is stopped but lovenox injections are maintained to keep the blood thin right up to the day befor e the procedure? Ask her if this was brought up? If so, we'll do it.  If not, since pt has a hematologist (Dr Benay Spice) plz contsct his nurse and ask [id number] if bridge therapy is a good idea for this pt only seven wks into coumafdin for upper arm dvt?

## 2015-09-03 NOTE — Telephone Encounter (Signed)
Talked with Dr. Gearldine Shown nurse and she stated that Dr. Benay Spice recommends that you contact Dr. Brenton Grills since he is the one doing the procedure to make sure this is ok with him. Dr. Benay Spice states that he recommends the patient stop the coumadin and take his last dose on Friday but he feels this should be approved by Dr. Brenton Grills since he is doing the procedure.

## 2015-09-04 MED ORDER — ENOXAPARIN SODIUM 80 MG/0.8ML ~~LOC~~ SOLN: SUBCUTANEOUS | Status: DC

## 2015-09-04 NOTE — Telephone Encounter (Signed)
Notified wife since dvt in the last two months, we'll do "bridge therapY'. Also cc this note to Dr Celesta Aver office. Stop coumadin actually last day of coumadin today,(five days prior to procedure) starting Sat morn, we'll use lovenox injections 80 mg sub q bid up to and including mon morn. Last dose lovenox mon morn. Should have inr ck'ed that day to make sure ok. Day following procedure resume coumadin at prior dose if ok with Dr Carlean Purl, also resume Lovenox (either home or hosp) recheck inr fri. Lovenox sent to pharmacy. Wife verbalized understanding. Wife stated that they are waiting on a call from Dr. Carlean Purl today to see if the procedure will be done on Tuesday. If so she will call us back tomorrow to schedule an INR check for Monday.

## 2015-09-04 NOTE — Telephone Encounter (Signed)
  Call pt, since dvt in the last two months, we'll do "bridge therapY'. Also cc this note to Dr Celesta Aver office. Stop coumadin actually last day of coumadin today,(five d prior to procedure) starting Sat morn, we'll use lovenox injections 80 mg sub q bid up tp and including mon morn. Last dose lovenox mon morn. Should have inr ck'ed that day to make sure ok. Day following procedure resume coumadin at prior dose if ok with Dr Carlean Purl, also resume Lovenox (either home or hosp) re ck inr fri ten Lovenox pre-prepared injections to call into his pharm, if family needs training how to give can come in tomorrow to see nurse

## 2015-09-08 ENCOUNTER — Telehealth: Payer: Self-pay | Admitting: *Deleted

## 2015-09-08 NOTE — Telephone Encounter (Signed)
  Oncology Nurse Navigator Documentation  Navigator Location: [facility] (09/08/15 1248) Navigator Encounter Type: Telephone (09/08/15 1248) Telephone: Incoming Call;Appt Confirmation/Clarification (09/08/15 1248)   Confirmed w/wife an appointment to see medical oncology at [facility] on 09/11/15 at 1 pm.  Will be seeing Dr. Pia Mau. They wish to keep the lab/flush on 4/6 here and see Dr. Benay Spice on 09/12/15 at 0930.

## 2015-09-09 ENCOUNTER — Encounter: Payer: Self-pay | Admitting: Vascular Surgery

## 2015-09-09 ENCOUNTER — Ambulatory Visit (INDEPENDENT_AMBULATORY_CARE_PROVIDER_SITE_OTHER): Payer: Medicare Other | Admitting: *Deleted

## 2015-09-09 DIAGNOSIS — D649 Anemia, unspecified: Secondary | ICD-10-CM

## 2015-09-09 DIAGNOSIS — Z7901 Long term (current) use of anticoagulants: Secondary | ICD-10-CM

## 2015-09-09 LAB — POCT HEMOGLOBIN: HEMOGLOBIN: 9.4 g/dL — AB (ref 14.1–18.1)

## 2015-09-09 LAB — POCT INR: INR: 2.1

## 2015-09-09 NOTE — Patient Instructions (Signed)
Continue the same dose on coumadin. 5mg  tablets. Take one every day except on Saturday take one and a half tablets. Recheck INR in one week.

## 2015-09-11 ENCOUNTER — Ambulatory Visit: Payer: Medicare Other | Admitting: Oncology

## 2015-09-11 ENCOUNTER — Other Ambulatory Visit (HOSPITAL_BASED_OUTPATIENT_CLINIC_OR_DEPARTMENT_OTHER): Payer: Medicare Other

## 2015-09-11 ENCOUNTER — Ambulatory Visit (HOSPITAL_BASED_OUTPATIENT_CLINIC_OR_DEPARTMENT_OTHER): Payer: Medicare Other

## 2015-09-11 DIAGNOSIS — C23 Malignant neoplasm of gallbladder: Secondary | ICD-10-CM

## 2015-09-11 DIAGNOSIS — Z95828 Presence of other vascular implants and grafts: Secondary | ICD-10-CM

## 2015-09-11 LAB — CBC WITH DIFFERENTIAL/PLATELET
BASO%: 0.3 % (ref 0.0–2.0)
BASOS ABS: 0 10*3/uL (ref 0.0–0.1)
EOS%: 0.8 % (ref 0.0–7.0)
Eosinophils Absolute: 0.1 10*3/uL (ref 0.0–0.5)
HEMATOCRIT: 30.3 % — AB (ref 38.4–49.9)
HEMOGLOBIN: 10 g/dL — AB (ref 13.0–17.1)
LYMPH#: 0.5 10*3/uL — AB (ref 0.9–3.3)
LYMPH%: 7.1 % — ABNORMAL LOW (ref 14.0–49.0)
MCH: 30.7 pg (ref 27.2–33.4)
MCHC: 33 g/dL (ref 32.0–36.0)
MCV: 92.9 fL (ref 79.3–98.0)
MONO#: 0.7 10*3/uL (ref 0.1–0.9)
MONO%: 9.2 % (ref 0.0–14.0)
NEUT#: 6 10*3/uL (ref 1.5–6.5)
NEUT%: 82.6 % — AB (ref 39.0–75.0)
Platelets: 186 10*3/uL (ref 140–400)
RBC: 3.26 10*6/uL — ABNORMAL LOW (ref 4.20–5.82)
RDW: 14.6 % (ref 11.0–14.6)
WBC: 7.3 10*3/uL (ref 4.0–10.3)
nRBC: 0 % (ref 0–0)

## 2015-09-11 MED ORDER — SODIUM CHLORIDE 0.9% FLUSH
10.0000 mL | INTRAVENOUS | Status: DC | PRN
Start: 2015-09-11 — End: 2015-09-11
  Administered 2015-09-11: 10 mL via INTRAVENOUS
  Filled 2015-09-11: qty 10

## 2015-09-11 MED ORDER — HEPARIN SOD (PORK) LOCK FLUSH 100 UNIT/ML IV SOLN
500.0000 [IU] | Freq: Once | INTRAVENOUS | Status: AC
  Administered 2015-09-11: 500 [IU] via INTRAVENOUS
  Filled 2015-09-11: qty 5

## 2015-09-11 NOTE — Patient Instructions (Signed)

## 2015-09-11 NOTE — Telephone Encounter (Signed)
Patient notified that Dr. Carlean Purl will be in touch in the next day or so

## 2015-09-11 NOTE — Telephone Encounter (Signed)
Please let them know I will recontact them soon I hope re: what is next - I discussed w/ Dr. Barry Dienes and surgery not recommended so need to revisit idea of stenting

## 2015-09-12 ENCOUNTER — Telehealth: Payer: Self-pay | Admitting: Nurse Practitioner

## 2015-09-12 ENCOUNTER — Ambulatory Visit (HOSPITAL_BASED_OUTPATIENT_CLINIC_OR_DEPARTMENT_OTHER): Payer: Medicare Other | Admitting: Oncology

## 2015-09-12 ENCOUNTER — Ambulatory Visit: Payer: Medicare Other | Admitting: Internal Medicine

## 2015-09-12 VITALS — BP 121/68 | HR 73 | Temp 97.7°F | Resp 18 | Ht 73.0 in | Wt 177.7 lb

## 2015-09-12 DIAGNOSIS — C23 Malignant neoplasm of gallbladder: Secondary | ICD-10-CM | POA: Diagnosis present

## 2015-09-12 DIAGNOSIS — C787 Secondary malignant neoplasm of liver and intrahepatic bile duct: Secondary | ICD-10-CM

## 2015-09-12 LAB — CANCER ANTIGEN 19-9: CA 19-9: 77 U/mL — ABNORMAL HIGH (ref 0–35)

## 2015-09-12 MED ORDER — LORAZEPAM 0.5 MG PO TABS: 0.5000 mg | ORAL_TABLET | Freq: Every evening | ORAL | Status: AC | PRN

## 2015-09-12 NOTE — Progress Notes (Signed)
Oncology Nurse Navigator Documentation  Oncology Nurse Navigator Flowsheets 09/12/2015  Navigator Location [facility]  Navigator Encounter Type Follow-up Appt  Telephone -  Patient Visit Type [facility]  Treatment Phase Other--discuss future tx  Barriers/Navigation Needs Education  Education Understanding Cancer/ Treatment Options  Interventions Education Method  Coordination of Care -  Education Method Written--handouts on FOLFOX  Acuity Level 1  Time Spent with Patient -  Time Spent with Patient (Retired) -

## 2015-09-12 NOTE — Telephone Encounter (Signed)
per pof to sch pt appt-gave pt copy of avs °

## 2015-09-12 NOTE — Progress Notes (Signed)
  [place] OFFICE PROGRESS NOTE   Diagnosis: Gallbladder carcinoma  INTERVAL HISTORY:   Mr. Rybacki returns as scheduled. He reports feeling well. He is tolerating a soft diet. No nausea or vomiting. Intermittent sharp discomfort in the right abdomen lasting for seconds. He continues Coumadin anticoagulation.  He saw Dr.Zafar at [facility] yesterday. Dr. Pia Mau discussed hospice/comfort care versus a trial of palliative chemotherapy.  Objective:  Vital signs in last 24 hours:  Blood pressure 121/68, pulse 73, temperature 97.7 F (36.5 C), temperature source Oral, resp. rate 18, height _0  (1.854 m), weight 177 lb 11.2 oz (80.604 kg), SpO2 99 %.    HEENT: Neck without mass Resp: Distant breath sounds Cardio: Regular rate and rhythm GI: No hepatomegaly, mild tenderness in the right lower abdomen, no mass Vascular: No leg edema     Portacath/PICC-without erythema  Lab Results:  Lab Results  Component Value Date   WBC 7.3 09/11/2015   HGB 10.0* 09/11/2015   HCT 30.3* 09/11/2015   MCV 92.9 09/11/2015   PLT 186 09/11/2015   NEUTROABS 6.0 09/11/2015   PT/INR 2.1  CA 19-9:  77   Medications: I have reviewed the patient's current medications.  Assessment/Plan: 1. 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, M0), status post a cholecystectomy 06/24/2014 with a 2.5 cm gallbladder mucosal mass with extension into attached hepatic parenchyma, positive hepatic parenchymal and cystic duct margins, diffuse lymphovascular invasion  Elevated CA 19-9  Partial liver resection, bile duct resection, lymph node resection on 08/01/2014 with the pathology confirming residual invasive adenocarcinoma in the liver-negative margins, metastatic adenocarcinoma with extranodal extension involving one lymph node  Final pathologic stage IIIB (pT3,pN1)  Initiation of adjuvant gemcitabine 09/05/2014 (09/05/2014, 09/12/2014, 09/19/2014)  Initiation of  adjuvant radiation/Xeloda 10/07/2014; completed 11/11/2014  Resumption of adjuvant gemcitabine 11/28/2014, completed 02/27/2015  Elevated CA 19-9 05/05/2015  CT 05/22/2015 with a vague hypoattenuating lesion in segment 4 of the liver  Ultrasound 06/03/2015 revealed a hypoechoic focus in the central liver, possibly corresponding to the CT finding from 05/22/2015  CT 07/31/2015 confirmed enlargement of 2 liver lesions  Upper endoscopy 08/13/2015 revealed a stricture in the second portion of the duodenum, biopsy confirmed adenocarcinoma  2. Intermittent right upper abdominal pain beginning in the summer of 2015-likely related to symptomatic cholelithiasis. Improved.  3. History of colon polyps  4. Family history of multiple cancers including colon cancer, status post a genetics evaluation. Positive for the BRCA1 mutation.  5. Elevated liver enzymes 09/07/2014; persistent  6. Upper abdomen/mid back pain-improved with Protonix, likely related to upper abdominal tumor/duodenal obstruction  7. DVT right arm 07/09/2015. Now on Coumadin  8. Anemia-likely secondary to chronic disease and chronic GI blood loss     Disposition:  Mr. Guster appears unchanged. He has a good performance status and he is tolerating a soft diet. We discussed the indication for palliative chemotherapy versus Hospice care. We specifically discussed FOLFOX chemotherapy. I reviewed the potential toxicities associated with the FOLFOX regimen.  Mr. Freese is undecided on proceeding with palliative chemotherapy. He will return for an office visit and further discussion on 09/22/2015.  We also discussed the indication for a duodenal stent. He plans to speak with Dr. Carlean Purl next week. He may be able to forego the stent since he is tolerating a mechanical soft diet.  We will continue monitoring the hemoglobin closely while on Coumadin.  Betsy Coder, MD  09/12/2015  9:40 AM

## 2015-09-16 ENCOUNTER — Ambulatory Visit (INDEPENDENT_AMBULATORY_CARE_PROVIDER_SITE_OTHER): Payer: Medicare Other | Admitting: Family Medicine

## 2015-09-16 ENCOUNTER — Encounter: Payer: Self-pay | Admitting: Family Medicine

## 2015-09-16 ENCOUNTER — Telehealth: Payer: Self-pay | Admitting: Internal Medicine

## 2015-09-16 ENCOUNTER — Other Ambulatory Visit: Payer: Self-pay

## 2015-09-16 ENCOUNTER — Encounter (HOSPITAL_COMMUNITY): Payer: Self-pay | Admitting: *Deleted

## 2015-09-16 VITALS — BP 124/70 | Ht 73.0 in | Wt 172.0 lb

## 2015-09-16 DIAGNOSIS — I6523 Occlusion and stenosis of bilateral carotid arteries: Secondary | ICD-10-CM

## 2015-09-16 DIAGNOSIS — D649 Anemia, unspecified: Secondary | ICD-10-CM | POA: Diagnosis not present

## 2015-09-16 DIAGNOSIS — C801 Malignant (primary) neoplasm, unspecified: Secondary | ICD-10-CM | POA: Diagnosis not present

## 2015-09-16 DIAGNOSIS — K315 Obstruction of duodenum: Secondary | ICD-10-CM

## 2015-09-16 DIAGNOSIS — R195 Other fecal abnormalities: Secondary | ICD-10-CM

## 2015-09-16 DIAGNOSIS — R109 Unspecified abdominal pain: Secondary | ICD-10-CM | POA: Diagnosis not present

## 2015-09-16 DIAGNOSIS — Z7901 Long term (current) use of anticoagulants: Secondary | ICD-10-CM | POA: Diagnosis not present

## 2015-09-16 LAB — POCT HEMOGLOBIN: HEMOGLOBIN: 8.9 g/dL — AB (ref 14.1–18.1)

## 2015-09-16 LAB — POCT INR: INR: 2

## 2015-09-16 NOTE — Telephone Encounter (Signed)
Patient scheduled for EGD 09/24/15 7:00 arrival for 8:30 case.   Left message for patient to call back

## 2015-09-16 NOTE — Telephone Encounter (Signed)
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 Mickie Hillier recommending a Lovenox bridge though Dr. Benay Spice did not think that needed from what I saw  I rec holding warfarin 3 d before and no bridge unless other MD's think needed   I have sent a message to Dr. Wolfgang Phoenix to call me as I was unable to reach him (office phones off?)  Summarizing  EGD w/ duodenal stent 4/19 MAC Fluoro Off warfarin x 3 d Leave bridge up to other MD

## 2015-09-16 NOTE — Progress Notes (Signed)
   Subjective:    Patient ID: Ronnie Nicholson, male    DOB: 1940-07-04, 75 y.o.   MRN: [id number] Patient arrives to the office with his wife for a very protracted discussion regarding his challenging situation HPI Follow up. Patient went and saw the specialist at [facility] unfortunately they could not offer much in terms of further intervention. Advised him that his gallbladder cancer likely meant just a few months to live. Understandably patient's bowels taking this quite hard.  Continues remain compliant with Coumadin. No further right arm challenges no edema no swelling no discomfort.  No chest pain no shortness of breath.  Compliant with Coumadin at appropriate dose.  Has seen no gross blood in stools but it has been confirm this is rediscussed. Next  There is stenosis in the duodenum which Dr. Carlean Purl is considered stenting, substantial conversation had about this, better to do this sooner than later before opportunity passes  Due to see Dr. Malachy Mood soon on Monday his oncologist. Next   INR today 2.0.  Hg 8.9. Results for orders placed or performed in visit on 09/16/15  POCT INR  Result Value Ref Range   INR 2.0   POCT hemoglobin  Result Value Ref Range   Hemoglobin 8.9 (A) 14.1 - 18.1 g/dL   1  Review of Systems Notes some shortness of breath with excessive exertion no chest pain no back pain some change bowel habits no gross blood in stool    Objective:   Physical Exam alert vital stable blood pressure good on repeat lungs clear heart rare rhythm abdomen some epigastric tenderness right arm pulses good sensation good no obvious edema no palpable tenderness      Assessment & Plan:  Impression arm DVT ideal duration of anticoagulation discussed. Very challenging because of ongoing blood loss see #2. I called and spoke with Dr. Oneida Alar, patient's vascular surgeon. Patient is due to see Dr. Oneida Alar later this week we discussed the potential for going ahead and stopping  anticoagulation. #2 anemia hemoglobin is dropped somewhat. Dr. Malachy Mood will be better position to give transfusions if need be discussed #3 duodenal stenosis discussed #4 gallbladder cancer discussed #5 end-of-life issues discussed at length plan 45 minutes spent most in discussion. I also communicated with Dr. Carlean Purl, spoke with Dr. Oneida Alar, and plan this week with Dr. Malachy Mood 2 help transitions. May need blood early next week WSL

## 2015-09-16 NOTE — Telephone Encounter (Signed)
Will not plan on Lovenox bridge per discussion with PCP

## 2015-09-16 NOTE — Telephone Encounter (Signed)
Patient's wife notified of instructions and appt details.  She verbalized understanding to hold coumadin starting 09/21/15 and no need for lovenox bridging.

## 2015-09-18 ENCOUNTER — Ambulatory Visit (INDEPENDENT_AMBULATORY_CARE_PROVIDER_SITE_OTHER): Payer: Medicare Other | Admitting: Vascular Surgery

## 2015-09-18 ENCOUNTER — Ambulatory Visit (HOSPITAL_COMMUNITY)
Admission: RE | Admit: 2015-09-18 | Discharge: 2015-09-18 | Disposition: A | Discharge status: Home / Self Care | Payer: Medicare Other | Source: Ambulatory Visit | Attending: Vascular Surgery | Admitting: Vascular Surgery

## 2015-09-18 ENCOUNTER — Telehealth: Payer: Self-pay | Admitting: *Deleted

## 2015-09-18 ENCOUNTER — Encounter: Payer: Self-pay | Admitting: Vascular Surgery

## 2015-09-18 VITALS — BP 118/65 | HR 69 | Ht 73.0 in | Wt 176.6 lb

## 2015-09-18 DIAGNOSIS — I6523 Occlusion and stenosis of bilateral carotid arteries: Secondary | ICD-10-CM | POA: Insufficient documentation

## 2015-09-18 DIAGNOSIS — I82621 Acute embolism and thrombosis of deep veins of right upper extremity: Secondary | ICD-10-CM | POA: Diagnosis not present

## 2015-09-18 DIAGNOSIS — I10 Essential (primary) hypertension: Secondary | ICD-10-CM | POA: Insufficient documentation

## 2015-09-18 DIAGNOSIS — I6522 Occlusion and stenosis of left carotid artery: Secondary | ICD-10-CM

## 2015-09-18 DIAGNOSIS — E785 Hyperlipidemia, unspecified: Secondary | ICD-10-CM | POA: Diagnosis not present

## 2015-09-18 NOTE — Progress Notes (Signed)
Referred by:   Mikey Kirschner, MD [address] [place], [place] [address]  Reason for referral: left carotid stenosis, right arm DVT  History of Present Illness  JULIA KULZER is a 75 y.o. (23-Dec-1940) male who presents for evaluation of his asymptomatic left carotid stenosis. His carotid disease has been followed for the past two years by his cardiologist, Dr. Bronson Ing.  Patient has no prior history of TIA or stroke symptom.  The patient has never had amaurosis fugax or monocular blindness.  The patient has never had facial drooping or hemiplegia.  The patient has not had receptive or expressive aphasia.  The patient's risks factors for carotid disease include: hyperlipidemia and former smoker.  Unfortunately he has also been recently diagnosed with metastatic gallbladder cancer. He recently developed a DVT in the right arm on the same side as a Port-A-Cath. He has not decided currently whether or not he wishes to undergo further chemotherapy. He has been on anticoagulation therapy in the form of Coumadin for 6 weeks. The last on his Port-A-Cath was used to September 2016. The patient has apparently been having some GI bleeding from his cancer. Dr. Wolfgang Phoenix asked if I could review the patient's need for anticoagulation in light of this.  He is s/p cholecystectomy and partial liver resection.  He has known aortic stenosis. His hyperlipidemia is managed on zetia. He no longer smokes. He is not diabetic. He does not have hypertension.     Past Medical History   Diagnosis  Date   .  Arthritis     .  Hyperlipidemia     .  History of colon polyps     .  Symptomatic PVCs     .  COPD (chronic obstructive pulmonary disease) ([place])     .  Left hydrocele     .  Bilateral carotid artery stenosis         PER DUPLEX 11-17-2012---  RICA 2-42%/  LICA  68-34%   .  Mild aortic valve stenosis     .  Wears glasses     .  Skin abrasion         right temple, left hand areas removed by  dermatology last week, areas healing   .  Family history of colon cancer     .  Family history of breast cancer     .  Hypertension     .  Heart murmur     .  HOH (hard of hearing)         wears hearing aids   .  Allergy     .  Skin cancer         basal cell carcinoma   .  Cancer ([place])  06/24/14       gallbladder cancer   .  BRCA1 positive         Past Surgical History   Procedure  Laterality  Date   .  Stapedectomy  Bilateral  1965 left, 1981 right   .  Shoulder surgery  Right  2004       rotator cuff   .  Transthoracic echocardiogram    03-20-2013  DR WALL       GRADE I DIASTOLIC DYSFUNCTION/  EF 65-70%/  MILD AORTIC STENOSIS WITH STABLE GRADIENTS   .  Tonsillectomy    1962   .  Hydrocele excision  Left  05/10/2013       Procedure: HYDROCELECTOMY ADULT;  Surgeon:  Franchot Gallo, MD;  Location: [facility];  Service: Urology;  Laterality: Left;   .  Colonoscopy       .  Cholecystectomy  N/A  06/24/2014       Procedure: LAPAROSCOPIC CHOLECYSTECTOMY;  Surgeon: Coralie Keens, MD;  Location: [place];  Service: General;  Laterality: N/A;   .  Open partial hepatectomy [83]    08/01/2014       Procedure: OPEN PARTIAL HEPATECTOMY, INTRA- OPERATIVE ULTRASOUND, COMMON BILE DUCT RESECTION, ROUX-EN-Y HEPATICOJEJUNOSTOMY;  Surgeon: [name], MD;  Location: [facility];  Service: General;;   .  Portacath placement  N/A  08/26/2014       Procedure: INSERTION PORT-A-CATH;  Surgeon: [name], MD;  Location: [place];  Service: General;  Laterality: N/A;       Social History      Social History   .  Marital Status:  Married       Spouse Name:  Thayer Headings   .  Number of Children:  1   .  Years of Education:  N/A      Occupational History   .  retired         08-2003      Social History Main Topics   .  Smoking status:  Former Smoker -- 1.00 packs/day for 10 years       Types:  Cigarettes       Start date:  06/07/1954       Quit date:  06/18/1983   .   Smokeless tobacco:  Never Used   .  Alcohol Use:  No   .  Drug Use:  No   .  Sexual Activity:  Not on file      Other Topics  Concern   .  Not on file      Social History Narrative     Married, wife Thayer Headings       Family History   Problem  Relation  Age of Onset   .  Colon cancer  Father  76   .  Colon cancer  Brother  37   .  Colon cancer  Brother     .  Breast cancer  Sister  33   .  Cancer  Maternal Uncle         NOS   .  Breast cancer  Paternal Aunt         dx <50   .  Cancer  Paternal Uncle         NOS   .  Cancer  Brother  50       cancer in a gland in his neck   .  Brain cancer  Brother  57   .  Melanoma  Brother  68   .  Breast cancer  Cousin         6 paternal first cousins        Current Outpatient Prescriptions on File Prior to Visit  Medication Sig Dispense Refill  . ferrous gluconate (FERGON) 324 MG tablet Take 324 mg by mouth 2 (two) times daily with a meal.    . lidocaine-prilocaine (EMLA) cream Apply small amount of cream over port area 1-2 hours prior to treatment and cover with plastic wrap.  DO NOT RUB IN (Patient taking differently: Apply 1 application topically See admin instructions. Apply small amount of cream over port area 1-2 hours prior to treatment and cover with plastic wrap.  DO NOT  RUB IN) 30 g PRN  . LORazepam (ATIVAN) 0.5 MG tablet Take 1 tablet (0.5 mg total) by mouth at bedtime as needed for anxiety. 30 tablet 1  . metoprolol tartrate (LOPRESSOR) 25 MG tablet Take 1 tablet (25 mg total) by mouth daily. 90 tablet 3  . Multiple Vitamins-Minerals (MULTIVITAMIN WITH MINERALS) tablet Take 1 tablet by mouth daily.    . pantoprazole (PROTONIX) 40 MG tablet Take 1 tablet (40 mg total) by mouth daily. 30 tablet 5  . prochlorperazine (COMPAZINE) 10 MG tablet Take 1 tablet (10 mg total) by mouth every 6 (six) hours as needed for nausea. 60 tablet 1  . warfarin (COUMADIN) 5 MG tablet TAKE 1 TABLET ( 5MG TOTAL ) BY MOUTH DAILY 30 tablet 5  . enoxaparin  (LOVENOX) 80 MG/0.8ML injection Take 80 mg subq BID (Patient not taking: Reported on 09/16/2015) 10 Syringe 0  . ezetimibe (ZETIA) 10 MG tablet Take 1 tablet (10 mg total) by mouth daily. (Patient not taking: Reported on 09/16/2015) 90 tablet 3   No current facility-administered medications on file prior to visit.      Allergies   Allergen  Reactions   .  Lodine [Etodolac]  Other (See Comments)       URINARY RETENTION   .  Flexeril [Cyclobenzaprine Hcl]  Rash   .  Tetracyclines & Related  Rash     REVIEW OF SYSTEMS:  (Positives checked otherwise negative)  CARDIOVASCULAR:  _0  chest pain, _1  chest pressure, _2  palpitations, _3  shortness of breath when laying flat, _4  shortness of breath with exertion,  _5  pain in feet when walking, _6  pain in feet when laying flat, _7  history of blood clot in veins (DVT), _8  history of phlebitis, _9  swelling in legs, _10  varicose veins  PULMONARY:  _11  productive cough, _12  asthma, _13  wheezing  NEUROLOGIC:  _14  weakness in arms or legs, _15  numbness in arms or legs, _16  difficulty speaking or slurred speech, _17  temporary loss of vision in one eye, _18  dizziness  HEMATOLOGIC:  _19  bleeding problems, _20  problems with blood clotting too easily  MUSCULOSKEL:  _21  joint pain, _22  joint swelling  GASTROINTEST:  _23  vomiting blood, _24  blood in stool              GENITOURINARY:  _25  burning with urination, _26  blood in urine  PSYCHIATRIC:  _27  history of major depression  INTEGUMENTARY:  _28  rashes, _29  ulcers  CONSTITUTIONAL:  _30  fever, _31  chills  For VQI Use Only  PRE-ADM LIVING: Home  AMB STATUS: Ambulatory  CAD Sx: None  PRIOR CHF: None  STRESS TEST: _32  No, _33  Normal, _34  + ischemia, _35  + MI, _36  Both  Physical Examination    Filed Vitals:   09/18/15 1116 09/18/15 1118  BP: 118/70 118/65  Pulse: 69   Height: _37  (1.854 m)   Weight: 176 lb 9.6 oz (80.105 kg)   SpO2: 99%     General: A&O x 3, WDWN male in NAD  Head: [place]/AT  Neck:  Supple  Pulmonary: Sym exp, good air movt, CTAB, no rales, rhonchi, & wheezing  Cardiac: RRR, systolic murmur best heard at right upper sternal border, bilateral carotid bruits.    Vascular: radial pulses equal and symmetric. Palpable femoral pulses. Palpable left pedal pulse. Trace palpable right pedal pulse. No real swelling in the right upper extremity Port-A-Cath palpable on the right chest  Gastrointestinal: soft, NTND, non palpable abdominal aorta  Musculoskeletal: M/S 5/5 throughout  Neurologic: CN 2-12 grossly intact  Psychiatric: Judgment intact, Mood & affect appropriate for pt's clinical situation  Dermatologic: See M/S exam for extremity exam, no rashes otherwise noted  Non-Invasive Vascular Imaging   CAROTID DUPLEX TODAY 40-60% right internal carotid artery stenosis, 60-80% left internal carotid artery stenosis    The patient has a 60-80% left internal carotid artery stenosis which probably leans more towards the mid range of this. He is currently asymptomatic. He is currently on Coumadin and lipid-lowering therapy. I believe the best option at this point is continued carotid surveillance with a repeat duplex exam in 6 months. If he develops symptoms or progresses to greater than 80% stenosis we would consider carotid endarterectomy at that time. I also discussed with the patient discontinuing carotid surveillance therapy at this point in light of his gallbladder cancer. He wishes to have a repeat duplex in 6 months.  However, his bigger issue at this point is his metastatic gallbladder cancer. I did discuss with him the risks versus benefits of being on anticoagulation for his right upper extremity DVT. He has now been on anticoagulation for 6 months. Most likely this is fairly low risk for pulmonary embolus. However he has metastatic cancer which will make him hypercoagulable. There are certainly advantages and disadvantages risks and benefits with continuing this versus  stopping it. If he has continued to have ongoing GI bleeding I would stop the anticoagulation. We had a lengthy discussion regarding stopping or continuing. We also discussed some whether he wishes to pursue further chemotherapy.  He is going to have further discussions with his wife and after [place] as well as Dr. Wolfgang Phoenix. I have no strong feelings either way with his anticoagulation but certainly if he is having GI bleeding that is more life-threatening then I would definitely consider stopping the anticoagulation. I would not place a superior vena cava filter as it is not worth the risk of this.   Ruta Hinds, MD [facility] Office: [phone] Pager: [phone]

## 2015-09-18 NOTE — Telephone Encounter (Signed)
Return call to wife that MD confirmed what this RN had told her. Will check counts on 4/17-transfuse if necessary. Dr. Benay Spice can manage his INR if necessary.

## 2015-09-18 NOTE — Telephone Encounter (Signed)
Oncology Nurse Navigator Documentation  Oncology Nurse Navigator Flowsheets 09/18/2015  Navigator Location [facility]  Navigator Encounter Type Telephone  Telephone Incoming Call;Patient Update  Patient Visit Type -  Treatment Phase -  Barriers/Navigation Needs Coordination of Care--wife has concern over his Hgb drop (was 8.9 on fingerstick w/Dr. Wolfgang Phoenix this week) Asking if this would interfere w/EGD stent placement?  Education  Symptom Management-informed her anemia would not prevent procedure, low wbc or platelet would or high INR (INR 2.0 this week). Will hold Coumadin starting 4/16 per Dr. Carlean Purl till after procedure.   Interventions Coordination of Care--lab   Coordination of Care Other--added T & H to labs on 4/17  Education Method -Verbal  Acuity Level 2  Time Spent with Patient 15  Time Spent with Patient (Retired) -  Wife reports Ronnie Nicholson is having no dizziness or shortness of breath at this time. Only complaint is extreme fatigue. Informed her we can also order a T & H on 4/17 in case his Hgb requires transfusion. She reports that Dr. Wolfgang Phoenix is going to have Dr. 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 Dr. Benay Spice notified.

## 2015-09-22 ENCOUNTER — Other Ambulatory Visit (HOSPITAL_BASED_OUTPATIENT_CLINIC_OR_DEPARTMENT_OTHER): Payer: Medicare Other

## 2015-09-22 ENCOUNTER — Encounter: Payer: Self-pay | Admitting: *Deleted

## 2015-09-22 ENCOUNTER — Ambulatory Visit (HOSPITAL_BASED_OUTPATIENT_CLINIC_OR_DEPARTMENT_OTHER): Payer: Medicare Other | Admitting: Nurse Practitioner

## 2015-09-22 VITALS — BP 126/56 | HR 58 | Temp 97.4°F | Resp 18 | Ht 73.0 in | Wt 176.5 lb

## 2015-09-22 DIAGNOSIS — I6523 Occlusion and stenosis of bilateral carotid arteries: Secondary | ICD-10-CM

## 2015-09-22 DIAGNOSIS — D649 Anemia, unspecified: Secondary | ICD-10-CM | POA: Diagnosis not present

## 2015-09-22 DIAGNOSIS — C23 Malignant neoplasm of gallbladder: Secondary | ICD-10-CM

## 2015-09-22 LAB — CBC WITH DIFFERENTIAL/PLATELET
BASO%: 0.5 % (ref 0.0–2.0)
BASOS ABS: 0 10*3/uL (ref 0.0–0.1)
EOS%: 0.7 % (ref 0.0–7.0)
Eosinophils Absolute: 0.1 10*3/uL (ref 0.0–0.5)
HEMATOCRIT: 31 % — AB (ref 38.4–49.9)
HEMOGLOBIN: 10.3 g/dL — AB (ref 13.0–17.1)
LYMPH#: 0.5 10*3/uL — AB (ref 0.9–3.3)
LYMPH%: 5.9 % — ABNORMAL LOW (ref 14.0–49.0)
MCH: 31.3 pg (ref 27.2–33.4)
MCHC: 33.4 g/dL (ref 32.0–36.0)
MCV: 93.8 fL (ref 79.3–98.0)
MONO#: 0.7 10*3/uL (ref 0.1–0.9)
MONO%: 7.9 % (ref 0.0–14.0)
NEUT%: 85 % — ABNORMAL HIGH (ref 39.0–75.0)
NEUTROS ABS: 7.4 10*3/uL — AB (ref 1.5–6.5)
Platelets: 192 10*3/uL (ref 140–400)
RBC: 3.3 10*6/uL — ABNORMAL LOW (ref 4.20–5.82)
RDW: 15 % — ABNORMAL HIGH (ref 11.0–14.6)
WBC: 8.8 10*3/uL (ref 4.0–10.3)

## 2015-09-22 MED ORDER — PROCHLORPERAZINE MALEATE 10 MG PO TABS: 10.0000 mg | ORAL_TABLET | Freq: Four times a day (QID) | ORAL | Status: AC | PRN

## 2015-09-22 MED ORDER — HYDROCODONE-ACETAMINOPHEN 5-325 MG PO TABS: 1.0000 | ORAL_TABLET | Freq: Four times a day (QID) | ORAL | Status: DC | PRN

## 2015-09-22 NOTE — Progress Notes (Addendum)
[place] OFFICE PROGRESS NOTE   Diagnosis:  Gallbladder carcinoma  INTERVAL HISTORY:   Ronnie Nicholson returns as scheduled. He had an episode of pain extending from the abdomen to the mid chest yesterday. The pain lasted about 8 hours. He took a hydrocodone tablet. He has no pain at present. No dysphagia. He is tolerating a soft diet. He is not aware of any bleeding. He reports Coumadin is currently on hold due to planned placement of a duodenal stent later this week.  Objective:  Vital signs in last 24 hours:  Blood pressure 126/56, pulse 58, temperature 97.4 F (36.3 C), temperature source Oral, resp. rate 18, height 6' 1"  (1.854 m), weight 176 lb 8 oz (80.06 kg), SpO2 100 %.    HEENT: No thrush or ulcers. Resp: Lungs clear bilaterally. Cardio: Regular rate and rhythm. GI: Abdomen soft and nontender. No hepatomegaly. Vascular: No leg edema. Port-A-Cath without erythema.  Lab Results:  Lab Results  Component Value Date   WBC 8.8 09/22/2015   HGB 10.3* 09/22/2015   HCT 31.0* 09/22/2015   MCV 93.8 09/22/2015   PLT 192 09/22/2015   NEUTROABS 7.4* 09/22/2015    Imaging:  No results found.  Medications: I have reviewed the patient's current medications.  Assessment/Plan: 1. 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, M0), status post a cholecystectomy 06/24/2014 with a 2.5 cm gallbladder mucosal mass with extension into attached hepatic parenchyma, positive hepatic parenchymal and cystic duct margins, diffuse lymphovascular invasion  Elevated CA 19-9  Partial liver resection, bile duct resection, lymph node resection on 08/01/2014 with the pathology confirming residual invasive adenocarcinoma in the liver-negative margins, metastatic adenocarcinoma with extranodal extension involving one lymph node  Final pathologic stage IIIB (pT3,pN1)  Initiation of adjuvant gemcitabine 09/05/2014 (09/05/2014, 09/12/2014,  09/19/2014)  Initiation of adjuvant radiation/Xeloda 10/07/2014; completed 11/11/2014  Resumption of adjuvant gemcitabine 11/28/2014, completed 02/27/2015  Elevated CA 19-9 05/05/2015  CT 05/22/2015 with a vague hypoattenuating lesion in segment 4 of the liver  Ultrasound 06/03/2015 revealed a hypoechoic focus in the central liver, possibly corresponding to the CT finding from 05/22/2015  CT 07/31/2015 confirmed enlargement of 2 liver lesions  Upper endoscopy 08/13/2015 revealed a stricture in the second portion of the duodenum, biopsy confirmed adenocarcinoma  2. Intermittent right upper abdominal pain beginning in the summer of 2015-likely related to symptomatic cholelithiasis. Improved.  3. History of colon polyps  4. Family history of multiple cancers including colon cancer, status post a genetics evaluation. Positive for the BRCA1 mutation.  5. Elevated liver enzymes 09/07/2014; persistent  6. Upper abdomen/mid back pain-improved with Protonix, likely related to upper abdominal tumor/duodenal obstruction  7. DVT right arm 07/09/2015. Now on Coumadin  8. Anemia-likely secondary to chronic disease and chronic GI blood loss    Disposition: Ronnie Nicholson appears unchanged. He has decided against palliative chemotherapy. He is scheduled for placement of a duodenal stent later this week.  He is currently holding Coumadin due to the upcoming stent. We discussed resuming Coumadin at the current dose following stent placement, resuming at a prophylactic dose of 1 mg daily, discontinuing Coumadin with the understanding he is at risk for a another blood clot due to the Port-A-Cath. He is in favor of having the Port-A-Cath removed and discontinuing Coumadin. He understands that he will still be at risk for having another blood clot though the risk should be lower once the Port-A-Cath is removed. We made a referral to Dr. Barry Dienes for removal of  the Port-A-Cath.  He will  return for a follow-up visit in approximately 2 weeks. He will contact the office in the interim with any problems.  Patient seen with Dr. Benay Spice. 25 minutes were spent face-to-face at today's visit with the majority of that time involved in counseling/coordination of care.    Ned Card ANP/GNP-BC   09/22/2015  11:51 AM  This was a shared visit with Ned Card.  Ronnie Nicholson has decided against chemotherapy.  We will refer him for removal of the portacath and coumadin will be discontinued.  Julieanne Manson, MD

## 2015-09-22 NOTE — Progress Notes (Signed)
Oncology Nurse Navigator Documentation  Oncology Nurse Navigator Flowsheets 09/22/2015  Navigator Location [facility]  Navigator Encounter Type Follow-up Appt  Telephone -  Patient Visit Type MedOnc  Treatment Phase -  Barriers/Navigation Needs Coordination of Care--PAC removal  Education -  Interventions Coordination of Care--sent message to Dr. Barry Dienes and her scheduler w/request--made aware that he is at [facility] on 4/19 for a procedure w/[facility]  Coordination of Care Other  Education Method -  Acuity Level 1  Time Spent with Patient 15  Time Spent with Patient (Retired) -

## 2015-09-24 ENCOUNTER — Ambulatory Visit (HOSPITAL_COMMUNITY)
Admission: RE | Admit: 2015-09-24 | Discharge: 2015-09-24 | Disposition: A | Discharge status: Home / Self Care | Payer: Medicare Other | Source: Ambulatory Visit | Attending: Internal Medicine | Admitting: Internal Medicine

## 2015-09-24 ENCOUNTER — Ambulatory Visit (HOSPITAL_COMMUNITY): Payer: Medicare Other | Admitting: Anesthesiology

## 2015-09-24 ENCOUNTER — Ambulatory Visit (HOSPITAL_COMMUNITY): Payer: Medicare Other

## 2015-09-24 ENCOUNTER — Encounter (HOSPITAL_COMMUNITY)
Admission: RE | Disposition: A | Discharge status: Home / Self Care | Payer: Self-pay | Source: Ambulatory Visit | Attending: Internal Medicine

## 2015-09-24 ENCOUNTER — Other Ambulatory Visit: Payer: Self-pay | Admitting: General Surgery

## 2015-09-24 ENCOUNTER — Encounter (HOSPITAL_COMMUNITY): Payer: Self-pay | Admitting: Anesthesiology

## 2015-09-24 ENCOUNTER — Telehealth: Payer: Self-pay

## 2015-09-24 DIAGNOSIS — Z79899 Other long term (current) drug therapy: Secondary | ICD-10-CM | POA: Diagnosis not present

## 2015-09-24 DIAGNOSIS — I350 Nonrheumatic aortic (valve) stenosis: Secondary | ICD-10-CM | POA: Insufficient documentation

## 2015-09-24 DIAGNOSIS — I739 Peripheral vascular disease, unspecified: Secondary | ICD-10-CM | POA: Insufficient documentation

## 2015-09-24 DIAGNOSIS — I10 Essential (primary) hypertension: Secondary | ICD-10-CM | POA: Diagnosis not present

## 2015-09-24 DIAGNOSIS — C787 Secondary malignant neoplasm of liver and intrahepatic bile duct: Secondary | ICD-10-CM | POA: Insufficient documentation

## 2015-09-24 DIAGNOSIS — K315 Obstruction of duodenum: Secondary | ICD-10-CM

## 2015-09-24 DIAGNOSIS — Z8509 Personal history of malignant neoplasm of other digestive organs: Secondary | ICD-10-CM | POA: Diagnosis not present

## 2015-09-24 DIAGNOSIS — J449 Chronic obstructive pulmonary disease, unspecified: Secondary | ICD-10-CM | POA: Diagnosis not present

## 2015-09-24 DIAGNOSIS — Z923 Personal history of irradiation: Secondary | ICD-10-CM | POA: Diagnosis not present

## 2015-09-24 DIAGNOSIS — Z800 Family history of malignant neoplasm of digestive organs: Secondary | ICD-10-CM | POA: Insufficient documentation

## 2015-09-24 DIAGNOSIS — D649 Anemia, unspecified: Secondary | ICD-10-CM | POA: Diagnosis not present

## 2015-09-24 DIAGNOSIS — Z7901 Long term (current) use of anticoagulants: Secondary | ICD-10-CM | POA: Insufficient documentation

## 2015-09-24 DIAGNOSIS — I071 Rheumatic tricuspid insufficiency: Secondary | ICD-10-CM | POA: Insufficient documentation

## 2015-09-24 DIAGNOSIS — Z9221 Personal history of antineoplastic chemotherapy: Secondary | ICD-10-CM | POA: Insufficient documentation

## 2015-09-24 DIAGNOSIS — M1990 Unspecified osteoarthritis, unspecified site: Secondary | ICD-10-CM | POA: Insufficient documentation

## 2015-09-24 DIAGNOSIS — Z87891 Personal history of nicotine dependence: Secondary | ICD-10-CM | POA: Diagnosis not present

## 2015-09-24 HISTORY — DX: Acute embolism and thrombosis of unspecified vein: I82.90

## 2015-09-24 HISTORY — PX: ESOPHAGOGASTRODUODENOSCOPY (EGD) WITH PROPOFOL: SHX5813

## 2015-09-24 HISTORY — PX: DUODENAL STENT PLACEMENT: SHX5541

## 2015-09-24 HISTORY — DX: Anemia, unspecified: D64.9

## 2015-09-24 LAB — PROTIME-INR
INR: 1.44 (ref 0.00–1.49)
Prothrombin Time: 17.1 seconds — ABNORMAL HIGH (ref 11.6–15.2)

## 2015-09-24 SURGERY — ESOPHAGOGASTRODUODENOSCOPY (EGD) WITH PROPOFOL
Anesthesia: Monitor Anesthesia Care

## 2015-09-24 MED ORDER — LIDOCAINE-PRILOCAINE 2.5-2.5 % EX CREA: 1.0000 "application " | TOPICAL_CREAM | CUTANEOUS | Status: DC

## 2015-09-24 MED ORDER — WARFARIN SODIUM 5 MG PO TABS: ORAL_TABLET | ORAL | Status: DC

## 2015-09-24 MED ORDER — PROPOFOL 10 MG/ML IV BOLUS
INTRAVENOUS | Status: AC
  Filled 2015-09-24: qty 20

## 2015-09-24 MED ORDER — PROPOFOL 500 MG/50ML IV EMUL
INTRAVENOUS | Status: DC | PRN
  Administered 2015-09-24: 75 ug/kg/min via INTRAVENOUS

## 2015-09-24 MED ORDER — ONDANSETRON HCL 4 MG/2ML IJ SOLN
INTRAMUSCULAR | Status: DC | PRN
  Administered 2015-09-24: 4 mg via INTRAVENOUS

## 2015-09-24 MED ORDER — PROPOFOL 10 MG/ML IV BOLUS
INTRAVENOUS | Status: DC | PRN
  Administered 2015-09-24 (×3): 20 mg via INTRAVENOUS
  Administered 2015-09-24: 40 mg via INTRAVENOUS

## 2015-09-24 MED ORDER — LACTATED RINGERS IV SOLN
INTRAVENOUS | Status: DC
  Administered 2015-09-24: 08:00:00 via INTRAVENOUS

## 2015-09-24 MED ORDER — SODIUM CHLORIDE 0.9 % IV SOLN
INTRAVENOUS | Status: DC
Start: 2015-09-24 — End: 2015-09-24

## 2015-09-24 SURGICAL SUPPLY — 14 items

## 2015-09-24 NOTE — Discharge Instructions (Addendum)
° °  The stent moved past the stricture so this was not successful.  I will contact you soon about trying another stent as we discussed.  Stay off warfarin for now.  I do want you to get an xray tomorrow - we will arrange for [facility] - to see where stent is to make sure it is not moving. You should be able to go to the xray department there in AM.  I appreciate the opportunity to care for you. Gatha Mayer, MD, FACG   YOU HAD AN ENDOSCOPIC PROCEDURE TODAY: Refer to the procedure report and other information in the discharge instructions given to you for any specific questions about what was found during the examination. If this information does not answer your questions, please call Dr. Celesta Aver office at [phone] to clarify.   YOU SHOULD EXPECT: Some feelings of bloating in the abdomen. Passage of more gas than usual. Walking can help get rid of the air that was put into your GI tract during the procedure and reduce the bloating. If you had a lower endoscopy (such as a colonoscopy or flexible sigmoidoscopy) you may notice spotting of blood in your stool or on the toilet paper. Some abdominal soreness may be present for a day or two, also.  DIET: Stay on liquids today. Clears until noon then full liquids. Stent diet (as written) tomorrow.   ACTIVITY: Your care partner should take you home directly after the procedure. You should plan to take it easy, moving slowly for the rest of the day. You can resume normal activity the day after the procedure however YOU SHOULD NOT DRIVE, use power tools, machinery or perform tasks that involve climbing or major physical exertion for 24 hours (because of the sedation medicines used during the test).   SYMPTOMS TO REPORT IMMEDIATELY: A gastroenterologist can be reached at any hour. Please call [phone]  for any of the following symptoms:  Following lower endoscopy (colonoscopy, flexible sigmoidoscopy) Excessive amounts of blood in the  stool  Significant tenderness, worsening of abdominal pains  Swelling of the abdomen that is new, acute  Fever of 100 or higher  Following upper endoscopy (EGD, EUS, ERCP, esophageal dilation) Vomiting of blood or coffee ground material  New, significant abdominal pain  New, significant chest pain or pain under the shoulder blades  Painful or persistently difficult swallowing  New shortness of breath  Black, tarry-looking or red, bloody stools

## 2015-09-24 NOTE — H&P (Signed)
[facility] History and Physical   Primary Care Physician:  Mickie Hillier, MD   Reason for Procedure:   place duodenal stent  Plan:    EGD and duodenal stent placement. The risks and benefits as well as alternatives of endoscopic procedure(s) have been discussed and reviewed. All questions answered. The patient agrees to proceed.   HPI: Ronnie Nicholson is a 75 y.o. 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   Past Medical History  Diagnosis Date  . Arthritis   . Hyperlipidemia   . History of colon polyps   . Symptomatic PVCs   . COPD (chronic obstructive pulmonary disease) ([place])   . Left hydrocele   . Mild aortic valve stenosis   . Wears glasses   . Skin abrasion     right temple, left hand areas removed by dermatology last week, areas healing  . Family history of colon cancer   . Family history of breast cancer   . Hypertension   . Heart murmur   . HOH (hard of hearing)     wears hearing aids  . Allergy   . BRCA1 positive   . Blood clot in vein     Right arm treating with coumadin  . Anemia   . Bilateral carotid artery stenosis     PER DUPLEX 11-17-2012---  RICA 9-02%/  LICA  40-97%. being followed by Dr. Claudia Pollock  . Skin cancer     basal cell carcinoma  . Cancer ([place]) 06/24/14    gallbladder cancer-surgery, Chemo-last 9'16, Radiation- last 6'16.Now with some reoccuring cancer ? liver metastasis    Past Surgical History  Procedure Laterality Date  . Stapedectomy Bilateral 1965 left, 1981 right  . Shoulder surgery Right 2004    rotator cuff  . Transthoracic echocardiogram  03-20-2013  DR WALL    GRADE I DIASTOLIC DYSFUNCTION/  EF 65-70%/  MILD AORTIC STENOSIS WITH STABLE GRADIENTS  . Tonsillectomy  1962  . Hydrocele excision Left 05/10/2013    Procedure: HYDROCELECTOMY  ADULT;  Surgeon: Franchot Gallo, MD;  Location: [facility];  Service: Urology;  Laterality: Left;  . Colonoscopy    . Cholecystectomy N/A 06/24/2014    Procedure: LAPAROSCOPIC CHOLECYSTECTOMY;  Surgeon: Coralie Keens, MD;  Location: [facility];  Service: General;  Laterality: N/A;  . Open partial hepatectomy [83]  08/01/2014    Procedure: OPEN PARTIAL HEPATECTOMY, INTRA- OPERATIVE ULTRASOUND, COMMON BILE DUCT RESECTION, ROUX-EN-Y HEPATICOJEJUNOSTOMY;  Surgeon: [name], MD;  Location: [facility];  Service: General;;  . Portacath placement N/A 08/26/2014    Procedure: INSERTION PORT-A-CATH;  Surgeon: [name], MD;  Location: [facility];  Service: General;  Laterality: N/A;  . Esophagogastroduodenoscopy N/A 08/13/2015    Procedure: ESOPHAGOGASTRODUODENOSCOPY (EGD);  Surgeon: Gatha Mayer, MD;  Location: [facility];  Service: Endoscopy;  Laterality: N/A;    Prior to Admission medications   Medication Sig Start Date End Date Taking? Authorizing Provider  ferrous gluconate (FERGON) 324 MG tablet Take 324 mg by mouth 2 (two) times daily with a meal. Reported on 09/22/2015   Yes Historical Provider, MD  HYDROcodone-acetaminophen (NORCO) 5-325 MG tablet Take 1-2 tablets by mouth every 6 (six) hours as needed for moderate pain. 09/22/15  Yes Owens Shark, NP  metoprolol tartrate (LOPRESSOR) 25 MG tablet Take 1 tablet (25 mg total)  by mouth daily. 03/13/15  Yes Herminio Commons, MD  Multiple Vitamins-Minerals (MULTIVITAMIN WITH MINERALS) tablet Take 1 tablet by mouth daily.   Yes Historical Provider, MD  pantoprazole (PROTONIX) 40 MG tablet Take 1 tablet (40 mg total) by mouth daily. 08/05/15  Yes Ladell Pier, MD  prochlorperazine (COMPAZINE) 10 MG tablet Take 1 tablet (10 mg total) by mouth every 6 (six) hours as needed for nausea. 09/22/15  Yes Owens Shark, NP  warfarin (COUMADIN) 5 MG tablet TAKE 1 TABLET ( 5MG TOTAL ) BY MOUTH DAILY 07/31/15  Yes Mikey Kirschner,  MD  enoxaparin (LOVENOX) 80 MG/0.8ML injection Take 80 mg subq BID Patient not taking: Reported on 09/16/2015 09/04/15   Mikey Kirschner, MD  ezetimibe (ZETIA) 10 MG tablet Take 1 tablet (10 mg total) by mouth daily. Patient not taking: Reported on 09/16/2015 02/12/15   Herminio Commons, MD  lidocaine-prilocaine (EMLA) cream Apply small amount of cream over port area 1-2 hours prior to treatment and cover with plastic wrap.  DO NOT RUB IN Patient taking differently: Apply 1 application topically See admin instructions. Apply small amount of cream over port area 1-2 hours prior to treatment and cover with plastic wrap.  DO NOT RUB IN 09/02/14   Ladell Pier, MD  LORazepam (ATIVAN) 0.5 MG tablet Take 1 tablet (0.5 mg total) by mouth at bedtime as needed for anxiety. 09/12/15   Ladell Pier, MD    Current Facility-Administered Medications  Medication Dose Route Frequency Provider Last Rate Last Dose  . 0.9 %  sodium chloride infusion   Intravenous Continuous Gatha Mayer, MD      . lactated ringers infusion   Intravenous Continuous Gatha Mayer, MD        Allergies as of 09/16/2015 - Review Complete 09/16/2015  Allergen Reaction Noted  . Lodine [etodolac] Other (See Comments) 05/07/2013  . Flexeril [cyclobenzaprine hcl] Rash 05/07/2013  . Tetracyclines & related Rash 05/07/2013    Family History  Problem Relation Age of Onset  . Colon cancer Father 39  . Colon cancer Brother 14  . Colon cancer Brother   . Breast cancer Sister 44  . Cancer Maternal Uncle     NOS  . Breast cancer Paternal Aunt     dx <50  . Cancer Paternal Uncle     NOS  . Cancer Brother 35    cancer in a gland in his neck  . Brain cancer Brother 64  . Melanoma Brother 45  . Breast cancer Cousin     6 paternal first cousins     Social History   Social History  . Marital Status: Married    Spouse Name: Thayer Headings  . Number of Children: 1  . Years of Education: N/A   Occupational History  . retired      08-2003   Social History Main Topics  . Smoking status: Former Smoker -- 1.00 packs/day for 10 years    Types: Cigarettes    Start date: 06/07/1954    Quit date: 06/18/1983  . Smokeless tobacco: Never Used  . Alcohol Use: No  . Drug Use: No  . Sexual Activity: Not on file   Other Topics Concern  . Not on file   Social History Narrative   Married, wife Thayer Headings    Review of Systems:  All other review of systems negative except as mentioned in the HPI.  Physical Exam: Vital signs in last 24 hours: Temp:  [  97.8 F (36.6 C)] 97.8 F (36.6 C) (04/19 0751) Pulse Rate:  [62] 62 (04/19 0751) Resp:  [62] 62 (04/19 0751) BP: (139)/(65) 139/65 mmHg (04/19 0751) SpO2:  [98 %] 98 % (04/19 0751) Weight:  [177 lb (80.287 kg)] 177 lb (80.287 kg) (04/19 0751)   General:   Alert,  Well-developed, well-nourished, pleasant and cooperative in NAD Lungs:  Clear throughout to auscultation.   Heart:  Regular rate and rhythm; no murmurs, clicks, rubs,  or gallops. Abdomen:  Soft, nontender and mildly distended. Normal bowel sounds.   Neuro/Psych:  Alert and cooperative. Normal mood and affect. A and O x 3   @Carl  Simonne Maffucci, MD, [facility] [phone] (pager) 09/24/2015 8:35 AM@

## 2015-09-24 NOTE — Telephone Encounter (Signed)
-----   Message from Gatha Mayer, MD sent at 09/24/2015 10:41 AM EDT ----- Regarding: needs [facility] at [facility] tomorrow Needs DG abd 1 v tomorrow re: check duodenal stent location  Use duodenal stricture as dx   Order for [facility]  I told them but if you could leave a message re: once order is in - he can go in AM

## 2015-09-24 NOTE — Op Note (Signed)
[facility] Patient Name: Ronnie Heinlein Procedure Date: 09/24/2015 MRN: [id number] Attending MD: Gatha Mayer , MD Date of Birth: [birth year] CSN:  Age: 75 Admit Type: Outpatient Procedure:                Upper GI endoscopy Indications:              Stent insertion Providers:                Gatha Mayer, MD, Hilma Favors, RN, Despina Pole, Technician, Delena Bali, CRNA Referring MD:              Medicines:                Monitored Anesthesia Care Complications:            No immediate complications. Estimated Blood Loss:     Estimated blood loss was minimal. Procedure:                Pre-Anesthesia Assessment:                           - Prior to the procedure, a History and Physical                            was performed, and patient medications and                            allergies were reviewed. The patient's tolerance of                            previous anesthesia was also reviewed. The risks                            and benefits of the procedure and the sedation                            options and risks were discussed with the patient.                            All questions were answered, and informed consent                            was obtained. Prior Anticoagulants: The patient                            last took Coumadin (warfarin) 4 days prior to the                            procedure. ASA Grade Assessment: III - A patient                            with severe systemic disease. After reviewing the  risks and benefits, the patient was deemed in                            satisfactory condition to undergo the procedure.                           The [id number]) scope was introduced through                            the mouth, and advanced to the duodenal bulb. The                            [id number] scope was introduced through the    and advanced to the. The [id number]) scope                            was introduced through the and advanced to the. The                            [id number]) scope was introduced through the                            and advanced to the. The upper GI endoscopy was                            technically difficult and complex due to a J-shaped                            stomach which made pyloric intubation difficult.                            The patient tolerated the procedure well. Scope In: Scope Out: Findings:      An acquired malignant-appearing, intrinsic severe stenosis was found in       the second portion of the duodenum and was non-traversed. This was       stented with a 22 mm x 6 cm WallFlex stent under fluoroscopic guidance.       Estimated blood loss was minimal. Stent migtrated past the stricture and       is not crossing the stricture - it is in place just past the stricture.       Pediatric gastroscope used first - unable to cross the stricture - then       used gastroscope - looping an issue due to J shaped stomach so used       colonoscope. Stent placed but migrated during deployment. Impression:               - Acquired duodenal stenosis. Prosthesis placed.                           - No specimens collected.                           - STENT MIGRTATED DURING DEPLOYMENT AND IS NOT  TREATING THE STRRICTURE/OBSTRUCTION Moderate Sedation:      Please see anesthesia notes, moderate sedation not given Recommendation:           - Patient has a contact number available for                            emergencies. The signs and symptoms of potential                            delayed complications were discussed with the                            patient. Return to normal activities tomorrow.                            Written discharge instructions were provided to the                            patient.                            - Clear liquid diet for 2 hours.                           - Full liquid diet STARTING AFTER CLEAR LIQUIDS                            THEN TRY STENT DIET TOMORROW.                           - Continue present medications.                           - BUT STAY OFF WARFARIN FOR NOW                           - Would place patient supine if another stent is                            tried think would get better visualization during                            deployment- discussed with family re: trying to                            repeat stent - will pursue a tertiary center for                            next attempt if we can get that done. Procedure Code(s):        --- Professional ---                           [phone], Esophagogastroduodenoscopy, flexible,  transoral; with placement of endoscopic stent                            (includes pre- and post-dilation and guide wire                            passage, when performed) Diagnosis Code(s):        --- Professional ---                           K31.5, Obstruction of duodenum                           Z46.59, Encounter for fitting and adjustment of                            other gastrointestinal appliance and device CPT copyright 2016 American Medical Association. All rights reserved. The codes documented in this report are preliminary and upon coder review may  be revised to meet current compliance requirements. Gatha Mayer, MD 09/24/2015 10:35:32 AM This report has been signed electronically. Number of Addenda: 0

## 2015-09-24 NOTE — Telephone Encounter (Signed)
I left a detailed message for the patient No need for an appt.  Patient can go at his convenience tomorrow to [facility]

## 2015-09-24 NOTE — Anesthesia Postprocedure Evaluation (Signed)
Anesthesia Post Note  Patient: Ronnie Nicholson  Procedure(s) Performed: Procedure(s) (LRB): ESOPHAGOGASTRODUODENOSCOPY (EGD) WITH PROPOFOL (N/A) DUODENAL STENT PLACEMENT (N/A)  Patient location during evaluation: PACU Anesthesia Type: MAC Level of consciousness: awake and alert Pain management: pain level controlled Vital Signs Assessment: post-procedure vital signs reviewed and stable Respiratory status: spontaneous breathing, nonlabored ventilation, respiratory function stable and patient connected to nasal cannula oxygen Cardiovascular status: stable and blood pressure returned to baseline Anesthetic complications: no    Last Vitals:  Filed Vitals:   09/24/15 1100 09/24/15 1109  BP: 190/81 188/80  Pulse: 62 62  Temp:    Resp: 16 15    Last Pain: There were no vitals filed for this visit.               Bralin Garry J

## 2015-09-24 NOTE — Transfer of Care (Signed)
Immediate Anesthesia Transfer of Care Note  Patient: Ronnie Nicholson  Procedure(s) Performed: Procedure(s): ESOPHAGOGASTRODUODENOSCOPY (EGD) WITH PROPOFOL (N/A) DUODENAL STENT PLACEMENT (N/A)  Patient Location: PACU  Anesthesia Type:MAC  Level of Consciousness:  sedated, patient cooperative and responds to stimulation  Airway & Oxygen Therapy:Patient Spontanous Breathing and Patient connected to face mask oxgen  Post-op Assessment:  Report given to PACU RN and Post -op Vital signs reviewed and stable  Post vital signs:  Reviewed and stable  Last Vitals:  Filed Vitals:   09/24/15 1010 09/24/15 1013  BP: 144/50   Pulse:  62  Temp:    Resp:  20    Complications: No apparent anesthesia complications

## 2015-09-24 NOTE — Anesthesia Preprocedure Evaluation (Addendum)
Anesthesia Evaluation  Patient identified by MRN, date of birth, ID band Patient awake    Reviewed: Allergy & Precautions, NPO status , Patient's Chart, lab work & pertinent test results  Airway Mallampati: II  TM Distance: >3 FB Neck ROM: Full    Dental no notable dental hx.    Pulmonary COPD, former smoker,    Pulmonary exam normal breath sounds clear to auscultation       Cardiovascular hypertension, Pt. on medications and Pt. on home beta blockers + Peripheral Vascular Disease  Normal cardiovascular exam+ Valvular Problems/Murmurs  Rhythm:Regular Rate:Normal  ECHO: 03/20/13: Study Conclusions  - Left ventricle: The cavity size was normal. Wall thickness was at the upper limits of normal. Systolic function was vigorous. The estimated ejection fraction was in the range of 65% to 70%. Wall motion was normal; there were no regional wall motion abnormalities. Doppler parameters are consistent with abnormal left ventricular relaxation (grade 1 diastolic dysfunction). - Aortic valve: Mildly calcified annulus. Probably trileaflet; moderately calcified leaflets. There was mild stenosis. No significant regurgitation. Mean gradient: 68mm Hg (S). Peak gradient: 69mm Hg (S). Valve area: 2.26cm^2(VTI). - Mitral valve: Mildly thickened leaflets . - Right atrium: Central venous pressure: 25mm Hg (est). - Atrial septum: No defect or patent foramen ovale was identified. - Tricuspid valve: Trivial regurgitation. - Pulmonary arteries: PA peak pressure: 20mm Hg (S). - Pericardium, extracardiac: There was no pericardial effusion. Impressions:    Neuro/Psych negative neurological ROS  negative psych ROS   GI/Hepatic negative GI ROS, Neg liver ROS,   Endo/Other  negative endocrine ROS  Renal/GU negative Renal ROS  negative genitourinary   Musculoskeletal  (+) Arthritis ,   Abdominal   Peds negative  pediatric ROS (+)  Hematology  (+) anemia ,   Anesthesia Other Findings   Reproductive/Obstetrics negative OB ROS                            Anesthesia Physical Anesthesia Plan  ASA: III  Anesthesia Plan: MAC   Post-op Pain Management:    Induction: Intravenous  Airway Management Planned: Natural Airway  Additional Equipment:   Intra-op Plan:   Post-operative Plan: Extubation in OR  Informed Consent: I have reviewed the patients History and Physical, chart, labs and discussed the procedure including the risks, benefits and alternatives for the proposed anesthesia with the patient or authorized representative who has indicated his/her understanding and acceptance.   Dental advisory given  Plan Discussed with: CRNA  Anesthesia Plan Comments:        Anesthesia Quick Evaluation

## 2015-09-25 ENCOUNTER — Ambulatory Visit (HOSPITAL_COMMUNITY)
Admission: RE | Admit: 2015-09-25 | Discharge: 2015-09-25 | Disposition: A | Discharge status: Home / Self Care | Payer: Medicare Other | Source: Ambulatory Visit | Attending: Internal Medicine | Admitting: Internal Medicine

## 2015-09-25 ENCOUNTER — Telehealth: Payer: Self-pay | Admitting: Internal Medicine

## 2015-09-25 DIAGNOSIS — K315 Obstruction of duodenum: Secondary | ICD-10-CM | POA: Diagnosis not present

## 2015-09-25 DIAGNOSIS — Z09 Encounter for follow-up examination after completed treatment for conditions other than malignant neoplasm: Secondary | ICD-10-CM | POA: Diagnosis not present

## 2015-09-25 NOTE — Telephone Encounter (Signed)
Patient's wife notified of recommendations and results

## 2015-09-25 NOTE — Telephone Encounter (Signed)
Please advise if patient should be taking iron supplements.

## 2015-09-25 NOTE — Telephone Encounter (Signed)
Stay on ferrous sulfate for now Let them know xray shows stent stable in position where I put it They will be hearing from [facility] about repeat stenting - arranged.

## 2015-09-25 NOTE — Telephone Encounter (Signed)
Left message for patient to call back  

## 2015-09-26 ENCOUNTER — Encounter (HOSPITAL_COMMUNITY): Payer: Self-pay | Admitting: *Deleted

## 2015-09-29 ENCOUNTER — Telehealth: Payer: Self-pay | Admitting: Internal Medicine

## 2015-09-29 NOTE — Telephone Encounter (Signed)
I spoke with Beth at Dr. Nelly Laurence office at [facility].  They have the referral information and she will contact the patient and schedule stent replacement.  Wife notified they should hear from them today

## 2015-09-29 NOTE — Telephone Encounter (Signed)
Beth from Dr. Nelly Laurence office states that she needs a copy of the EGD that Dr. Carlean Purl did recently on this patient. P: [phone] F: [phone]

## 2015-09-29 NOTE — Telephone Encounter (Signed)
Notes faxed.

## 2015-09-30 ENCOUNTER — Encounter: Payer: Self-pay | Admitting: Family Medicine

## 2015-09-30 ENCOUNTER — Ambulatory Visit (INDEPENDENT_AMBULATORY_CARE_PROVIDER_SITE_OTHER): Payer: Medicare Other | Admitting: Family Medicine

## 2015-09-30 ENCOUNTER — Other Ambulatory Visit: Payer: Self-pay

## 2015-09-30 ENCOUNTER — Ambulatory Visit (HOSPITAL_COMMUNITY)
Admission: RE | Admit: 2015-09-30 | Discharge: 2015-09-30 | Disposition: A | Discharge status: Home / Self Care | Payer: Medicare Other | Source: Ambulatory Visit | Attending: Family Medicine | Admitting: Family Medicine

## 2015-09-30 ENCOUNTER — Ambulatory Visit (INDEPENDENT_AMBULATORY_CARE_PROVIDER_SITE_OTHER): Payer: Medicare Other

## 2015-09-30 ENCOUNTER — Other Ambulatory Visit (HOSPITAL_COMMUNITY)
Admission: RE | Admit: 2015-09-30 | Discharge: 2015-09-30 | Disposition: A | Discharge status: Home / Self Care | Payer: Medicare Other | Source: Ambulatory Visit | Attending: Family Medicine | Admitting: Family Medicine

## 2015-09-30 VITALS — BP 144/74 | Temp 98.6°F | Ht 73.0 in | Wt 176.0 lb

## 2015-09-30 DIAGNOSIS — R5383 Other fatigue: Secondary | ICD-10-CM

## 2015-09-30 DIAGNOSIS — R195 Other fecal abnormalities: Secondary | ICD-10-CM

## 2015-09-30 DIAGNOSIS — M25511 Pain in right shoulder: Secondary | ICD-10-CM

## 2015-09-30 DIAGNOSIS — D509 Iron deficiency anemia, unspecified: Secondary | ICD-10-CM

## 2015-09-30 DIAGNOSIS — I10 Essential (primary) hypertension: Secondary | ICD-10-CM

## 2015-09-30 DIAGNOSIS — C801 Malignant (primary) neoplasm, unspecified: Secondary | ICD-10-CM | POA: Diagnosis not present

## 2015-09-30 DIAGNOSIS — M79601 Pain in right arm: Secondary | ICD-10-CM | POA: Diagnosis present

## 2015-09-30 DIAGNOSIS — I6523 Occlusion and stenosis of bilateral carotid arteries: Secondary | ICD-10-CM

## 2015-09-30 DIAGNOSIS — D649 Anemia, unspecified: Secondary | ICD-10-CM

## 2015-09-30 DIAGNOSIS — Z7901 Long term (current) use of anticoagulants: Secondary | ICD-10-CM | POA: Diagnosis not present

## 2015-09-30 DIAGNOSIS — Z86718 Personal history of other venous thrombosis and embolism: Secondary | ICD-10-CM | POA: Diagnosis present

## 2015-09-30 DIAGNOSIS — R109 Unspecified abdominal pain: Secondary | ICD-10-CM | POA: Diagnosis not present

## 2015-09-30 LAB — HEPATIC FUNCTION PANEL
ALBUMIN: 2.8 g/dL — AB (ref 3.5–5.0)
ALT: 43 U/L (ref 17–63)
AST: 57 U/L — AB (ref 15–41)
Alkaline Phosphatase: 465 U/L — ABNORMAL HIGH (ref 38–126)
Bilirubin, Direct: 0.2 mg/dL (ref 0.1–0.5)
Indirect Bilirubin: 0.5 mg/dL (ref 0.3–0.9)
Total Bilirubin: 0.7 mg/dL (ref 0.3–1.2)
Total Protein: 6.4 g/dL — ABNORMAL LOW (ref 6.5–8.1)

## 2015-09-30 LAB — CBC WITH DIFFERENTIAL/PLATELET
Basophils Absolute: 0 10*3/uL (ref 0.0–0.1)
Basophils Relative: 0 %
EOS ABS: 0.1 10*3/uL (ref 0.0–0.7)
EOS PCT: 1 %
HCT: 30.6 % — ABNORMAL LOW (ref 39.0–52.0)
HEMOGLOBIN: 10.2 g/dL — AB (ref 13.0–17.0)
LYMPHS ABS: 0.7 10*3/uL (ref 0.7–4.0)
LYMPHS PCT: 8 %
MCH: 31.1 pg (ref 26.0–34.0)
MCHC: 33.3 g/dL (ref 30.0–36.0)
MCV: 93.3 fL (ref 78.0–100.0)
MONOS PCT: 3 %
Monocytes Absolute: 0.3 10*3/uL (ref 0.1–1.0)
NEUTROS PCT: 88 %
Neutro Abs: 8.6 10*3/uL — ABNORMAL HIGH (ref 1.7–7.7)
Platelets: 201 10*3/uL (ref 150–400)
RBC: 3.28 MIL/uL — ABNORMAL LOW (ref 4.22–5.81)
RDW: 14 % (ref 11.5–15.5)
WBC: 9.7 10*3/uL (ref 4.0–10.5)

## 2015-09-30 LAB — BASIC METABOLIC PANEL
Anion gap: 8 (ref 5–15)
BUN: 18 mg/dL (ref 6–20)
CHLORIDE: 99 mmol/L — AB (ref 101–111)
CO2: 27 mmol/L (ref 22–32)
Calcium: 8.4 mg/dL — ABNORMAL LOW (ref 8.9–10.3)
Creatinine, Ser: 0.85 mg/dL (ref 0.61–1.24)
GFR calc Af Amer: >60 mL/min (ref >60)
GFR calc non Af Amer: >60 mL/min (ref >60)
Glucose, Bld: 142 mg/dL — ABNORMAL HIGH (ref 65–99)
POTASSIUM: 3.9 mmol/L (ref 3.5–5.1)
Sodium: 134 mmol/L — ABNORMAL LOW (ref 135–145)

## 2015-09-30 LAB — POCT INR: INR: 1

## 2015-09-30 NOTE — Progress Notes (Signed)
   Subjective:    Patient ID: Ronnie Nicholson, male    DOB: 05-Nov-1940, 75 y.o.   MRN: [id number] Patient arrives office for very protracted discussion regarding all of his concerns. HPI Patient in today for right shoulder pain (See Ultrasound results). Describes it as a deep ache. Worse in the evening. Difficult getting comfortable place. Was concerned that he had another clot in his right arm. Next  See prior notes had a DVT of couple months ago.  Notes substantial diminishment of appetite in the last couple weeks. Along with this substantial fatigue. Patient does have a duodenal stricture did secondary to the metastatic cancer. Unfortunately developing early satiety and also diminished appetite  Expressing constipation with pain medicine. Only using approximately 1 pain medicine per day.  Has come off the Coumadin after further discussion with vascular surgeon and oncology folks.  First attempted stent bypass last week did not work due to have a second attempt down at [facility] early next week  Also has c/o severe fatigue. Onset of fatigue 10 days ago.   Also has concerns of INR being 1.0.  Hemoglobin today stable next  Family was concerned about potential anemia.   Does not like insurance and therefore not eating Review of Systems No headache no shortness of breath no change in bowel habits with ongoing consultation no noticeable blood in stool    Objective:   Physical Exam  Alert vital stable pleasant moderate malaise HEENT normal lungs clear heart rare rhythm shoulder good range of motion positive epigastric tenderness  Hospital records scanned records specialist notes all reviewed in presence of patient      Assessment & Plan:  Patient arrives impression 1 shoulder pain unfortunately likely referred pain from liver cancer involvement discussed at length #2 pain control discussed including family's concerns about addiction which of course we immediately said was not  initiated. Also control constipation proper use discussed #3 suboptimal nutrition with now low protein and weight loss. Discussed. Try [employer] Breakfast #4 DVT concerns none present her 5 anticoagulation concerns advised I think a good idea to have stopped #6 anemia and discuss #7 signs and symptoms of dehydration plan 40 minutes spent most in discussion many questions answered. Follow-up with specialist. Recheck here in 6 weeks hospice also discuss with questions answered

## 2015-10-03 ENCOUNTER — Other Ambulatory Visit: Payer: Self-pay | Admitting: Cardiovascular Disease

## 2015-10-08 ENCOUNTER — Encounter: Payer: Self-pay | Admitting: *Deleted

## 2015-10-08 ENCOUNTER — Telehealth: Payer: Self-pay | Admitting: Oncology

## 2015-10-08 ENCOUNTER — Other Ambulatory Visit (HOSPITAL_BASED_OUTPATIENT_CLINIC_OR_DEPARTMENT_OTHER): Payer: Medicare Other

## 2015-10-08 ENCOUNTER — Ambulatory Visit (HOSPITAL_BASED_OUTPATIENT_CLINIC_OR_DEPARTMENT_OTHER): Payer: Medicare Other | Admitting: Oncology

## 2015-10-08 VITALS — BP 132/74 | HR 79 | Temp 98.1°F | Resp 18 | Ht 73.0 in | Wt 174.0 lb

## 2015-10-08 DIAGNOSIS — C23 Malignant neoplasm of gallbladder: Secondary | ICD-10-CM

## 2015-10-08 DIAGNOSIS — D649 Anemia, unspecified: Secondary | ICD-10-CM | POA: Diagnosis not present

## 2015-10-08 DIAGNOSIS — I6523 Occlusion and stenosis of bilateral carotid arteries: Secondary | ICD-10-CM | POA: Diagnosis not present

## 2015-10-08 LAB — CBC WITH DIFFERENTIAL/PLATELET
BASO%: 0.3 % (ref 0.0–2.0)
BASOS ABS: 0 10*3/uL (ref 0.0–0.1)
EOS%: 0.8 % (ref 0.0–7.0)
Eosinophils Absolute: 0.1 10*3/uL (ref 0.0–0.5)
HEMATOCRIT: 31.9 % — AB (ref 38.4–49.9)
HGB: 10.5 g/dL — ABNORMAL LOW (ref 13.0–17.1)
LYMPH#: 0.8 10*3/uL — AB (ref 0.9–3.3)
LYMPH%: 6.5 % — ABNORMAL LOW (ref 14.0–49.0)
MCH: 30.7 pg (ref 27.2–33.4)
MCHC: 32.9 g/dL (ref 32.0–36.0)
MCV: 93.3 fL (ref 79.3–98.0)
MONO#: 0.4 10*3/uL (ref 0.1–0.9)
MONO%: 3.4 % (ref 0.0–14.0)
NEUT#: 10.6 10*3/uL — ABNORMAL HIGH (ref 1.5–6.5)
NEUT%: 89 % — AB (ref 39.0–75.0)
PLATELETS: 216 10*3/uL (ref 140–400)
RBC: 3.42 10*6/uL — ABNORMAL LOW (ref 4.20–5.82)
RDW: 13.7 % (ref 11.0–14.6)
WBC: 11.9 10*3/uL — ABNORMAL HIGH (ref 4.0–10.3)

## 2015-10-08 MED ORDER — OXYCODONE HCL 5 MG PO TABS: 5.0000 mg | ORAL_TABLET | ORAL | Status: AC | PRN

## 2015-10-08 NOTE — Progress Notes (Signed)
[place] OFFICE PROGRESS NOTE   Diagnosis: Gallbladder carcinoma  INTERVAL HISTORY:   Mr. Ponds returns as scheduled. He underwent placement of a duodenal stent by Dr. Carlean Purl on 09/24/2015. The stent migrated beyond the stricture during deployment. He was referred to Dr. Harl Bowie at [facility] and underwent placement of a second stent on 10/06/2015.  He reports increased abdominal pain. He has anorexia. No nausea/vomiting. He is tolerating a liquid and soft diet. He had a small bowel movement since placement of the stent. He is passing gas. The abdomen is distended.  Objective:  Vital signs in last 24 hours:  Blood pressure 132/74, pulse 79, temperature 98.1 F (36.7 C), temperature source Oral, resp. rate 18, height 6' 1"  (1.854 m), weight 174 lb (78.926 kg), SpO2 98 %.     Resp: Distant breath sounds, no respiratory distress Cardio: Regular rate and rhythm GI: No hepatomegaly, mildly distended, active bowel sounds, no mass, nontender Vascular: No leg edema   Portacath/PICC-without erythema  Lab Results:  Lab Results  Component Value Date   WBC 11.9* 10/08/2015   HGB 10.5* 10/08/2015   HCT 31.9* 10/08/2015   MCV 93.3 10/08/2015   PLT 216 10/08/2015   NEUTROABS 10.6* 10/08/2015    Medications: I have reviewed the patient's current medications.  Assessment/Plan: 1. 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, M0), status post a cholecystectomy 06/24/2014 with a 2.5 cm gallbladder mucosal mass with extension into attached hepatic parenchyma, positive hepatic parenchymal and cystic duct margins, diffuse lymphovascular invasion  Elevated CA 19-9  Partial liver resection, bile duct resection, lymph node resection on 08/01/2014 with the pathology confirming residual invasive adenocarcinoma in the liver-negative margins, metastatic adenocarcinoma with extranodal extension involving one lymph node  Final pathologic stage IIIB  (pT3,pN1)  Initiation of adjuvant gemcitabine 09/05/2014 (09/05/2014, 09/12/2014, 09/19/2014)  Initiation of adjuvant radiation/Xeloda 10/07/2014; completed 11/11/2014  Resumption of adjuvant gemcitabine 11/28/2014, completed 02/27/2015  Elevated CA 19-9 05/05/2015  CT 05/22/2015 with a vague hypoattenuating lesion in segment 4 of the liver  Ultrasound 06/03/2015 revealed a hypoechoic focus in the central liver, possibly corresponding to the CT finding from 05/22/2015  CT 07/31/2015 confirmed enlargement of 2 liver lesions  Upper endoscopy 08/13/2015 revealed a stricture in the second portion of the duodenum, biopsy confirmed adenocarcinoma  Placement of a duodenal stricture 09/24/2015, stent migrated beyond the stricture  Placement of a second stent at [facility] on 10/06/2015  2. Intermittent right upper abdominal pain beginning in the summer of 2015-likely related to symptomatic cholelithiasis. Improved.  3. History of colon polyps  4. Family history of multiple cancers including colon cancer, status post a genetics evaluation. Positive for the BRCA1 mutation.  5. Elevated liver enzymes 09/07/2014; persistent  6. Upper abdomen/mid back pain-improved with Protonix, likely related to upper abdominal tumor/duodenal obstruction  7. DVT right arm 07/09/2015.   8. 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 I suspect the increased pain in the upper abdomen is related to progression of the metastatic tumor burden. He underwent placement of a duodenal stent 10/06/2015.  Mr. Ladouceur would like to have the Port-A-Cath removed. He is no longer taking Coumadin. The hemoglobin is stable.  He agrees to Hospice care. We will make a referral to the [facility] program. We discussed CPR and ACLS issues. He will be placed on a no CODE BLUE status.  Mr. Shenberger will try  oxycodone for  pain. He will return for an office visit in 2 weeks.    Betsy Coder, MD  10/08/2015  10:51 AM

## 2015-10-08 NOTE — Telephone Encounter (Signed)
Gave pt Avs

## 2015-10-08 NOTE — Progress Notes (Signed)
Oncology Nurse Navigator Documentation  Oncology Nurse Navigator Flowsheets 10/08/2015  Navigator Location [facility]  Navigator Encounter Type Follow-up Appt  Telephone Outgoing Call to [facility]  Patient Visit Type MedOnc  Treatment Phase Follow-up--supportive care  Barriers/Navigation Needs Family concerns;Coordination of Care  Education Coping with Diagnosis/ Prognosis;Pain/ Symptom Management--start oxyir and d/c vicodin; no need to scan him if no intervention is planned.  Interventions Referrals;Coordination of Care;Education Method  Coordination of Care Hospice/Palliative Care--referral called to intake nurse and collaborative nurse will fax records  Education Method Verbal;Teach-back  Acuity Level 2  Time Spent with Patient 45  Time Spent with Patient (Retired) -  Patient says he is ready to go to sleep and wake up in [place]. Reminded him that it is God's timing and not his. He needs to make the most of the time he has now for family relationships and impart good memories for his grandson to remember him. Even if he does not feel like it, get up and go to his ball games and interact with family. Provided samples of Ensure Clear and Boost Breeze to try since he does not like standard Boost or Ensure. He discussed code status w/MD and wants to be DNR. Dr. Benay Spice will remain his attending MD w/Hospice, but Hospice MD may assist in symptom management.

## 2015-10-09 NOTE — Progress Notes (Signed)
Per Burnie( Triage) at [place] per Dr Barry Dienes continue to call with preop instructions because patient does want portacath removed.

## 2015-10-09 NOTE — Progress Notes (Signed)
[facility] ( spoke with Burnie- Triage at office ) and made her aware of office visit note with Dr Ammie Dalton on 10/08/2015.  She will let Dr Barry Dienes be aware of Hospice and get back with me if port removal will still take place on 10/15/15.

## 2015-10-10 ENCOUNTER — Encounter (HOSPITAL_COMMUNITY): Payer: Self-pay | Admitting: *Deleted

## 2015-10-10 NOTE — Progress Notes (Signed)
Wife of patient came in to PST and picked up hibiclens soap for preop.  Reviewed preop instructions with wife and wife given copies of instructions.  Wife verbalized understanding.

## 2015-10-13 ENCOUNTER — Encounter: Payer: Self-pay | Admitting: *Deleted

## 2015-10-13 ENCOUNTER — Other Ambulatory Visit: Payer: Self-pay | Admitting: *Deleted

## 2015-10-13 ENCOUNTER — Telehealth: Payer: Self-pay | Admitting: *Deleted

## 2015-10-13 DIAGNOSIS — I6523 Occlusion and stenosis of bilateral carotid arteries: Secondary | ICD-10-CM

## 2015-10-13 NOTE — Progress Notes (Signed)
Robin RN at Dr. Marlowe Aschoff office notified of cancellation of port removal on 10/15/15.

## 2015-10-13 NOTE — Progress Notes (Signed)
Verbal order given to [facility] to flush pt.'s port PRN.

## 2015-10-13 NOTE — Progress Notes (Signed)
Return call placed to wife regarding her concerns.  Per Dr. Benay Spice, pt.'s wife instructed that it is OK to keep port in and that no blood thinners are needed at this time.  Pt.'s wife informed that we will contact Dr. Marlowe Aschoff office to cancel port removal. [facility] to flush port if needed. Pt.'s wife verbalized an understanding of information and has no further questions at this time.

## 2015-10-13 NOTE — Telephone Encounter (Signed)
  Oncology Nurse Navigator Documentation  Navigator Location: [facility] (10/13/15 0959) Navigator Encounter Type: Telephone (10/13/15 0959) Telephone: Incoming Call;Patient Update (10/13/15 [id number])  Wife left message that Sriansh is not up to going to surgery for port removal on 5/10. He may get up from bed and go to his chair only. Sometimes dresses himself and sometimes not. Only oral intake is sips and some yogurt. Has lost another 8 lb in last week (164 lb today). Asking if he should go on a low dose blood thinner since they may not be able to remove it? Hospice will need orders for his port care if it stays in. Forwarded message to MD.

## 2015-10-15 ENCOUNTER — Encounter (HOSPITAL_COMMUNITY): Admission: RE | Payer: Self-pay | Source: Ambulatory Visit

## 2015-10-15 ENCOUNTER — Ambulatory Visit (HOSPITAL_COMMUNITY): Admission: RE | Admit: 2015-10-15 | Payer: Medicare Other | Source: Ambulatory Visit | Admitting: General Surgery

## 2015-10-15 ENCOUNTER — Ambulatory Visit (INDEPENDENT_AMBULATORY_CARE_PROVIDER_SITE_OTHER): Payer: Medicare Other | Admitting: Family Medicine

## 2015-10-15 ENCOUNTER — Encounter: Payer: Self-pay | Admitting: Family Medicine

## 2015-10-15 VITALS — BP 122/74 | Temp 98.9°F | Ht 73.0 in | Wt 170.0 lb

## 2015-10-15 DIAGNOSIS — R300 Dysuria: Secondary | ICD-10-CM | POA: Diagnosis not present

## 2015-10-15 DIAGNOSIS — N410 Acute prostatitis: Secondary | ICD-10-CM

## 2015-10-15 DIAGNOSIS — K5909 Other constipation: Secondary | ICD-10-CM

## 2015-10-15 DIAGNOSIS — I6523 Occlusion and stenosis of bilateral carotid arteries: Secondary | ICD-10-CM

## 2015-10-15 DIAGNOSIS — R109 Unspecified abdominal pain: Secondary | ICD-10-CM | POA: Diagnosis not present

## 2015-10-15 DIAGNOSIS — C801 Malignant (primary) neoplasm, unspecified: Secondary | ICD-10-CM

## 2015-10-15 LAB — POCT URINALYSIS DIPSTICK
PH UA: 5
Spec Grav, UA: 1.015

## 2015-10-15 SURGERY — REMOVAL PORT-A-CATH
Anesthesia: Monitor Anesthesia Care

## 2015-10-15 MED ORDER — TAMSULOSIN HCL 0.4 MG PO CAPS: 0.4000 mg | ORAL_CAPSULE | Freq: Every day | ORAL | Status: AC

## 2015-10-15 MED ORDER — CIPROFLOXACIN HCL 500 MG PO TABS: 500.0000 mg | ORAL_TABLET | Freq: Two times a day (BID) | ORAL | Status: AC

## 2015-10-15 NOTE — Progress Notes (Signed)
   Subjective:    Patient ID: Ronnie Nicholson, male    DOB: May 14, 1941, 75 y.o.   MRN: [id number] Patient arrives office with numerous concerns Dysuria  This is a new problem. Episode onset: 3 -4 days. He has tried nothing for the symptoms.   Once or twice per night, patient has been getting up to urinate.  prostate infxn hx Remotely mild dysuria. Increase SNC trouble urinating in morning. Next  Notes progressive right upper quadrant pain fairly severe times. Oxycodone now handling it well.  Notes progressive constipation. This despite marrow wax each morning and [place] he evening.  Tremendous diminishment of appetite  Results for orders placed or performed in visit on 10/15/15  POCT urinalysis dipstick  Result Value Ref Range   Color, UA     Clarity, UA     Glucose, UA     Bilirubin, UA ++    Ketones, UA     Spec Grav, UA 1.015    Blood, UA     pH, UA 5.0    Protein, UA     Urobilinogen, UA     Nitrite, UA     Leukocytes, UA  Negative     Review of Systems  Constitutional: Negative for fever and activity change.  HENT: Negative for ear pain.   Eyes: Negative for discharge.  Respiratory: Negative for wheezing.   Cardiovascular: Negative for chest pain.       Objective:   Physical Exam Alert vitals stable HEENT normal lungs clear. Heart regular in rhythm right upper quadrant notch were region. Low abdomen minimal discomfort prostate boggy and tender       Assessment & Plan:  Impression 1 acute prostatitis with out what instruction #2 worsening pain same secondary to cancer discuss now well treated with oxycodone No. 3 constipation likely due to pain discussed quitting interventions #4 anorexia likely due to cancer discussed plan double up on marrow wax. Add Cipro twice a day 21 days. Flomax daily at bedtime encouraged to speak with specialist regarding Megace WSL

## 2015-10-15 NOTE — Patient Instructions (Signed)
Megace question if this will be helpful

## 2015-10-22 ENCOUNTER — Ambulatory Visit (HOSPITAL_BASED_OUTPATIENT_CLINIC_OR_DEPARTMENT_OTHER): Admitting: Nurse Practitioner

## 2015-10-22 ENCOUNTER — Telehealth: Payer: Self-pay | Admitting: Oncology

## 2015-10-22 VITALS — BP 96/43 | HR 98 | Temp 98.5°F | Resp 18 | Ht 72.0 in | Wt 172.9 lb

## 2015-10-22 DIAGNOSIS — C787 Secondary malignant neoplasm of liver and intrahepatic bile duct: Secondary | ICD-10-CM

## 2015-10-22 DIAGNOSIS — B370 Candidal stomatitis: Secondary | ICD-10-CM | POA: Diagnosis not present

## 2015-10-22 DIAGNOSIS — C23 Malignant neoplasm of gallbladder: Secondary | ICD-10-CM

## 2015-10-22 DIAGNOSIS — I959 Hypotension, unspecified: Secondary | ICD-10-CM

## 2015-10-22 MED ORDER — PREDNISONE 10 MG PO TABS: 10.0000 mg | ORAL_TABLET | Freq: Every day | ORAL | Status: AC

## 2015-10-22 MED ORDER — FLUCONAZOLE 100 MG PO TABS: 100.0000 mg | ORAL_TABLET | Freq: Every day | ORAL | Status: AC

## 2015-10-22 NOTE — Progress Notes (Addendum)
[place] OFFICE PROGRESS NOTE   Diagnosis:  Gallbladder carcinoma  INTERVAL HISTORY:   Ronnie Nicholson returns as scheduled. He is enrolled in the hospice program. An aide is helping with his bath 3 days a week. He reports his pain is controlled with oxycodone 10 mg every 4 hours. He does not require pain medication during the night. Abdomen is intermittently distended. He has had a single episode of nausea recently. Appetite remains poor. Bowels are moving. He continues a laxative regimen. He fell yesterday.  Objective:  Vital signs in last 24 hours:  Blood pressure 96/43, pulse 98, temperature 98.5 F (36.9 C), temperature source Oral, resp. rate 18, height 6' (1.829 m), weight 172 lb 14.4 oz (78.427 kg), SpO2 98 %.    HEENT: White coating over tongue. Resp: Lungs clear bilaterally. Breath sounds are distant. Cardio: Regular rate and rhythm. GI: Abdomen mildly distended consistent with ascites. No hepatomegaly. Vascular: No leg edema. Port-A-Cath without erythema. Lab Results:  Lab Results  Component Value Date   WBC 11.9* 10/08/2015   HGB 10.5* 10/08/2015   HCT 31.9* 10/08/2015   MCV 93.3 10/08/2015   PLT 216 10/08/2015   NEUTROABS 10.6* 10/08/2015    Imaging:  No results found.  Medications: I have reviewed the patient's current medications.  Assessment/Plan: 1. 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, M0), status post a cholecystectomy 06/24/2014 with a 2.5 cm gallbladder mucosal mass with extension into attached hepatic parenchyma, positive hepatic parenchymal and cystic duct margins, diffuse lymphovascular invasion  Elevated CA 19-9  Partial liver resection, bile duct resection, lymph node resection on 08/01/2014 with the pathology confirming residual invasive adenocarcinoma in the liver-negative margins, metastatic adenocarcinoma with extranodal extension involving one lymph node  Final pathologic stage IIIB  (pT3,pN1)  Initiation of adjuvant gemcitabine 09/05/2014 (09/05/2014, 09/12/2014, 09/19/2014)  Initiation of adjuvant radiation/Xeloda 10/07/2014; completed 11/11/2014  Resumption of adjuvant gemcitabine 11/28/2014, completed 02/27/2015  Elevated CA 19-9 05/05/2015  CT 05/22/2015 with a vague hypoattenuating lesion in segment 4 of the liver  Ultrasound 06/03/2015 revealed a hypoechoic focus in the central liver, possibly corresponding to the CT finding from 05/22/2015  CT 07/31/2015 confirmed enlargement of 2 liver lesions  Upper endoscopy 08/13/2015 revealed a stricture in the second portion of the duodenum, biopsy confirmed adenocarcinoma  Placement of a duodenal stricture 09/24/2015, stent migrated beyond the stricture  Placement of a second stent at [facility] on 10/06/2015  2. Intermittent right upper abdominal pain beginning in the summer of 2015-likely related to symptomatic cholelithiasis. Improved.  3. History of colon polyps  4. Family history of multiple cancers including colon cancer, status post a genetics evaluation. Positive for the BRCA1 mutation.  5. Elevated liver enzymes 09/07/2014; persistent  6. Upper abdomen/mid back pain-improved with Protonix, likely related to upper abdominal tumor/duodenal obstruction  7. DVT right arm 07/09/2015.   8. Anemia-likely secondary to chronic disease and chronic GI blood loss   Disposition: Ronnie Nicholson continues to slowly decline. He is now enrolled in the hospice program. He reports adequate pain control with the current regimen of oxycodone 10 mg every 4 hours as needed. We can consider a long-acting pain medication if requirements for pain medication increase.  For the anorexia his wife inquired about Megace. We discussed the increased risk of blood clots with Megace and decided against this. He will begin prednisone 10 mg daily.  He is mildly hypotensive in the office today. He will decreased metoprolol to  12.5 mg daily.  On exam today he has abdominal distention consistent with ascites. At present he appears asymptomatic from the ascites. We discussed a therapeutic paracentesis if the ascites increases.  He appears to have oral candidiasis. He will complete a course of Diflucan.  We scheduled a return visit in 3 weeks. We will see him sooner if needed.  Patient seen with Dr. Benay Spice. 25 minutes were spent face-to-face at today's visit with the majority of that time involved in counseling/coordination of care.    Ned Card ANP/GNP-BC   10/22/2015  12:08 PM  This was a shared visit with Ned Card. Ronnie Nicholson has a declining performance status in the setting of metastatic gallbladder carcinoma. He is enrolled in the [facility] program. He will return for an office visit in 3 weeks. We referred him to the symptom management clinic to discuss the possibility of an electronic visit next week to follow-up on his pain and appetite. I think he would benefit from the electronic visit.  Julieanne Manson, M.D.

## 2015-10-22 NOTE — Telephone Encounter (Signed)
per pof to sch pt appt-*gave spouse time & date of appt-req no avs

## 2015-11-05 ENCOUNTER — Telehealth: Payer: Self-pay | Admitting: Oncology

## 2015-11-05 NOTE — Telephone Encounter (Signed)
pt wife cld stating to CX all appts pt was in Hospice. CX appts

## 2015-11-10 ENCOUNTER — Telehealth: Payer: Self-pay | Admitting: *Deleted

## 2015-11-12 ENCOUNTER — Ambulatory Visit: Payer: Medicare Other | Admitting: Oncology

## 2015-11-12 ENCOUNTER — Telehealth: Payer: Self-pay | Admitting: Internal Medicine

## 2015-11-12 ENCOUNTER — Ambulatory Visit: Payer: Medicare Other | Admitting: Family Medicine

## 2015-11-12 NOTE — Telephone Encounter (Signed)
FYI Dr, Carlean Purl

## 2015-11-14 ENCOUNTER — Ambulatory Visit: Payer: Medicare Other | Admitting: Family Medicine

## 2015-12-06 NOTE — Telephone Encounter (Signed)
Wife called to report Ronnie Nicholson died today at 74 in hospice home. Wanted to thank Dr. Benay Spice and his staff for all their love, support and excellent care.

## 2015-12-06 DEATH — deceased

## 2016-03-03 IMAGING — CR DG CHEST 1V PORT
1 series · 1 of 1 positions shown · non-contrast
Comparison: Study obtained earlier in the day

CLINICAL DATA: Port-A-Cath placement

EXAM:
PORTABLE CHEST - 1 VIEW

[AP]
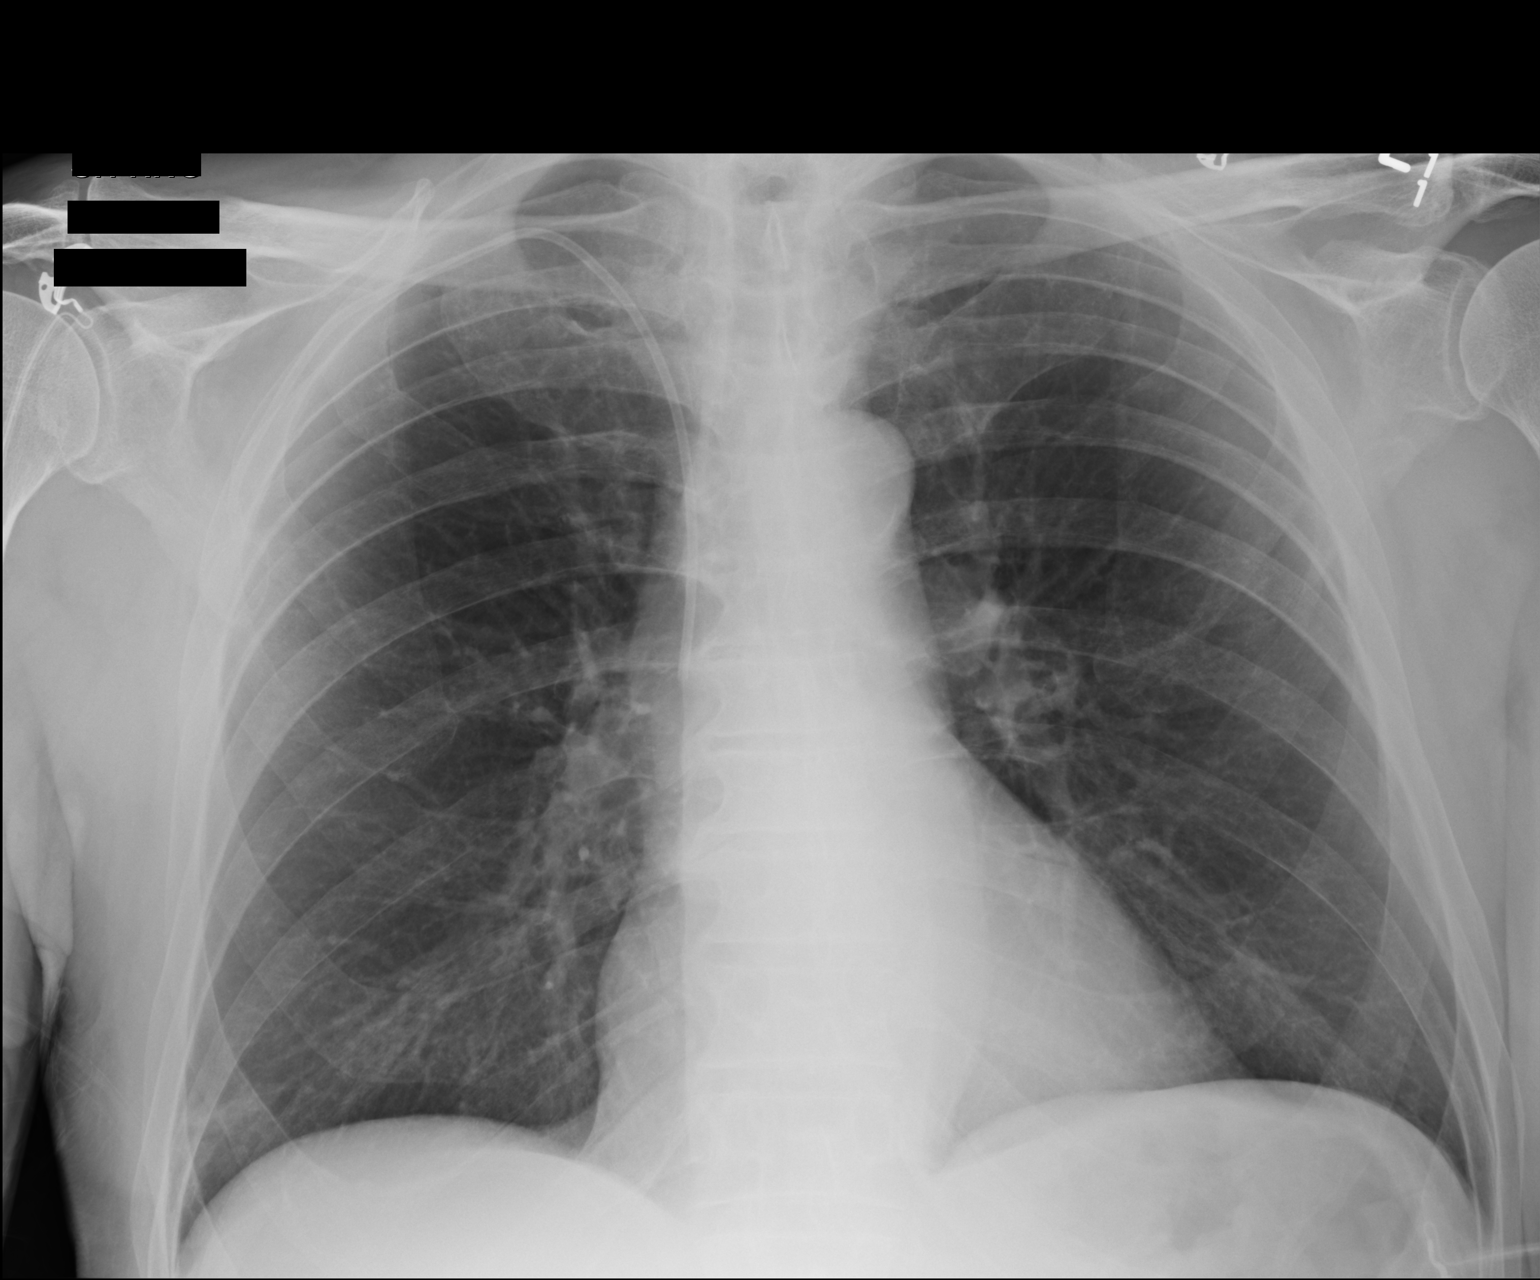

[1 of 1 positions shown; findings below may reference images not displayed]

FINDINGS: Port-A-Cath tip is in the superior vena cava, essentially unchanged
compared to study obtained earlier in the day. No pneumothorax.
There is subsegmental atelectasis in the right base laterally. Lungs
are otherwise clear. Heart size and pulmonary vascularity are
normal. No adenopathy. No bone lesions.
IMPRESSION: Port-A-Cath tip in superior vena cava. No pneumothorax. Lungs are
clear except for mild right base atelectasis laterally. No change in
cardiac silhouette. Note that the skin fold is no longer appreciable
on the left side.

## 2016-03-18 IMAGING — CT CT ABD-PELV W/ CM
3 of 5 series · 12 of 36 positions shown, 18 images · IV contrast (READICAT/WATER & [ID] ISOVUE 300)
Comparison: Abdominal pelvic CT 08/19/2014, 07/17/2014 and
05/24/2014.

CLINICAL DATA: Gallbladder cancer. Cholecystectomy 06/24/2014 with
subsequent surgery in [REDACTED] including partial hepatectomy. Mid
abdominal and back pain. Weight loss.

EXAM:
CT ABDOMEN AND PELVIS WITH CONTRAST
TECHNIQUE: Multidetector CT imaging of the abdomen and pelvis was performed
using the standard protocol following bolus administration of
intravenous contrast.
CONTRAST:  100 ml Hsovue-VYY.

[Series 3: abd/pelvis with · axial · 0.74mm/px · z∈[-400,-100]mm · 6 of 84 slices shown, 11 images]
[im 12/84  soft-tissue]
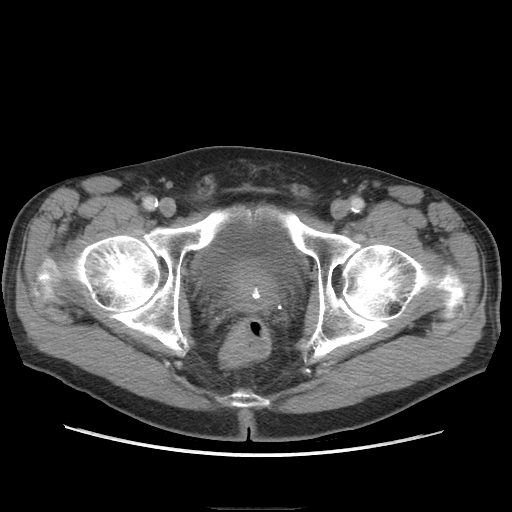
[im 12/84  bone]
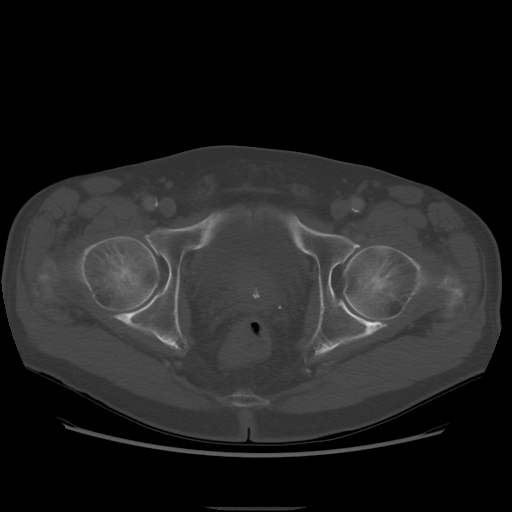
[im 24/84  soft-tissue]
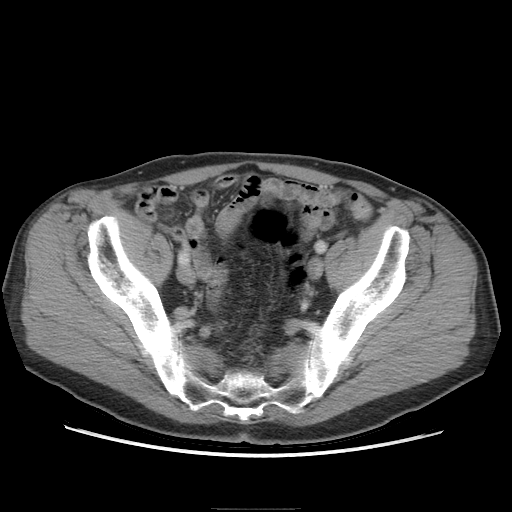
[im 36/84  soft-tissue]
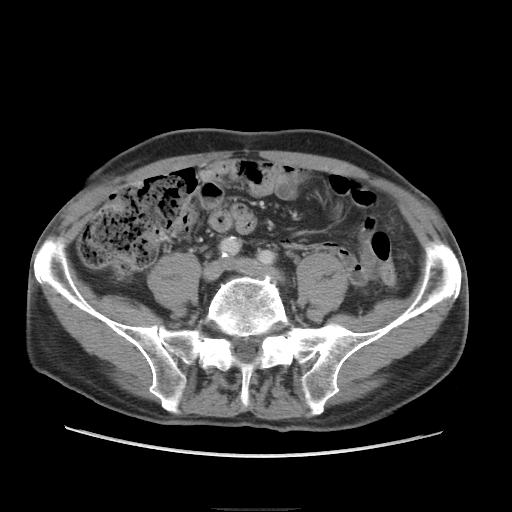
[im 36/84  lung]
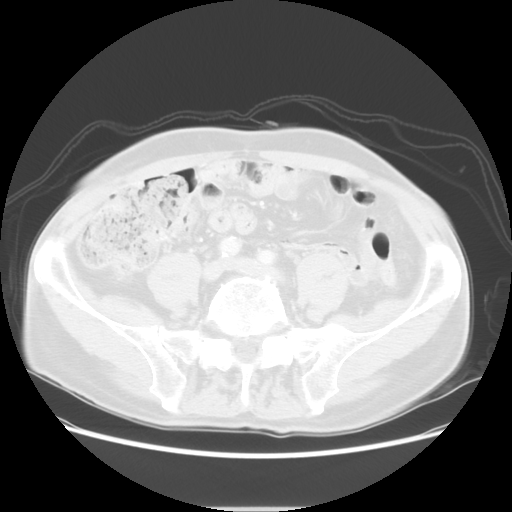
[im 48/84  soft-tissue]
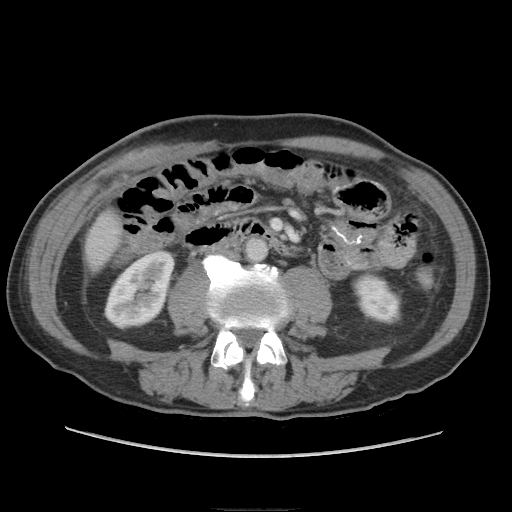
[im 48/84  lung]
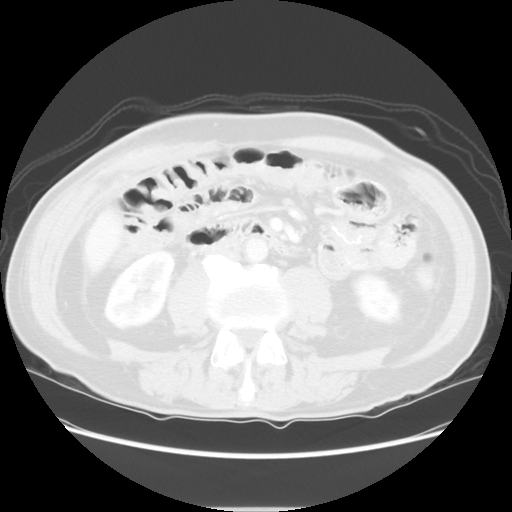
[im 60/84  soft-tissue]
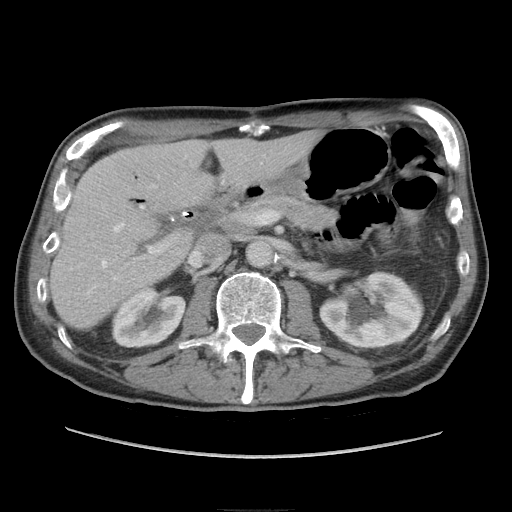
[im 60/84  lung]
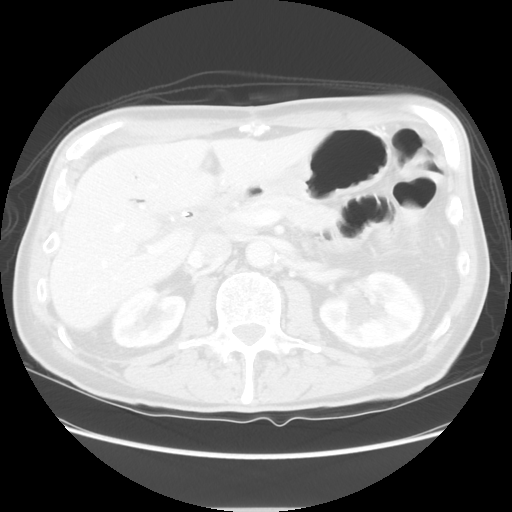
[im 72/84  soft-tissue]
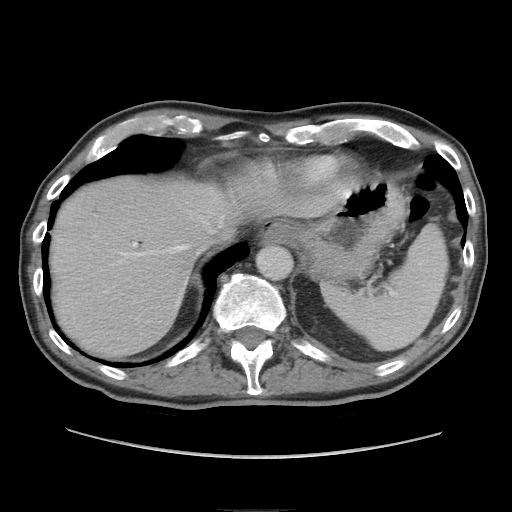
[im 72/84  lung]
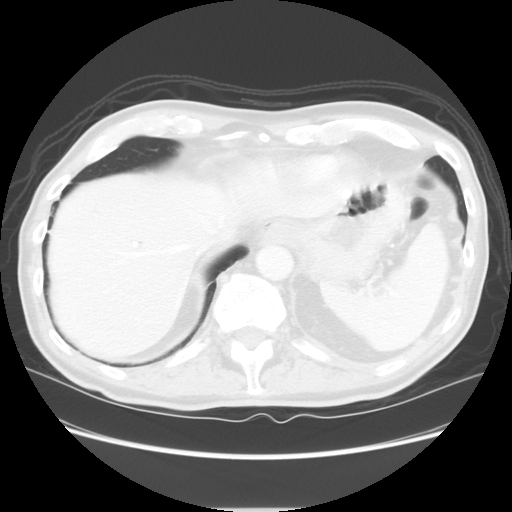

[Series 601: coronal body · coronal · 0.89mm/px · 1 of 111 slices shown, 2 images]
[im 37/111  soft-tissue]
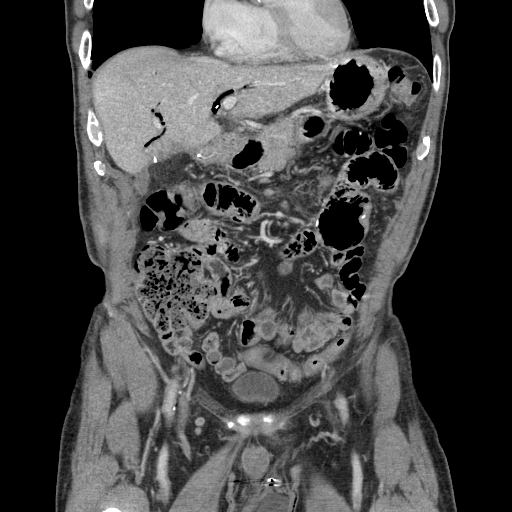
[im 37/111  bone]
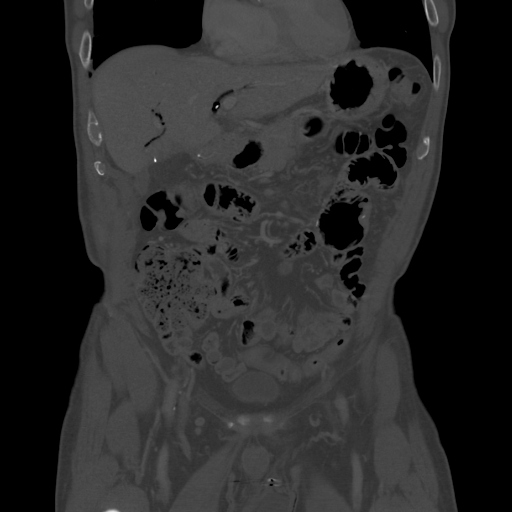

[Series 602: sagittal body · sagittal · 0.89mm/px · 5 of 152 slices shown]
[im 10/152  soft-tissue]
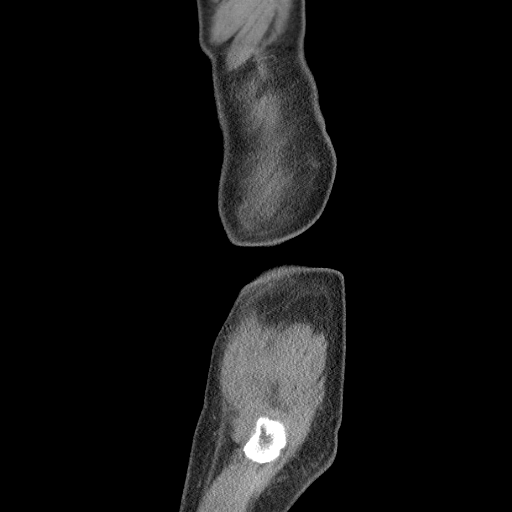
[im 29/152  soft-tissue]
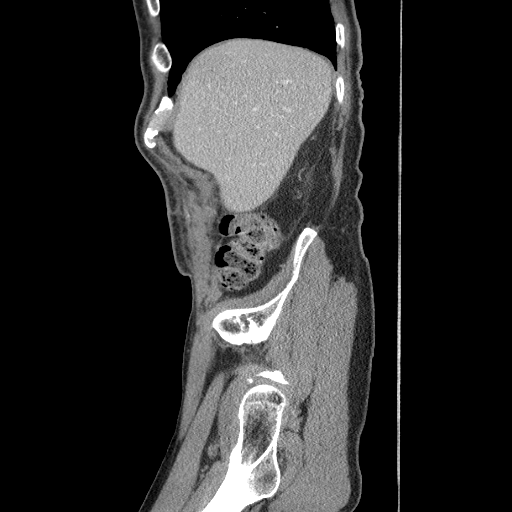
[im 48/152  soft-tissue]
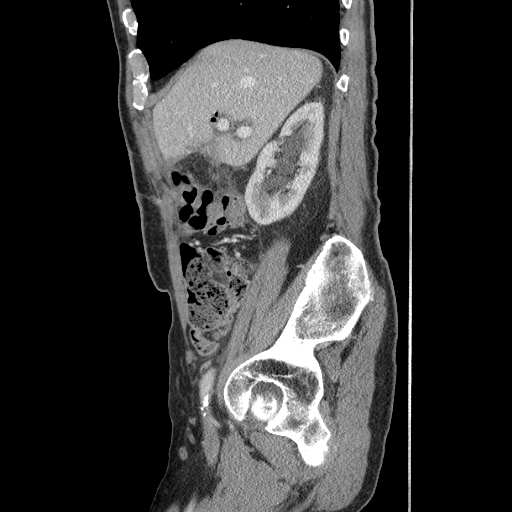
[im 67/152  soft-tissue]
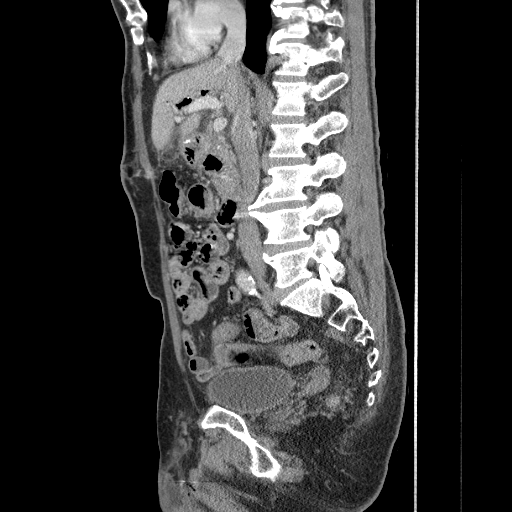
[im 85/152  soft-tissue]
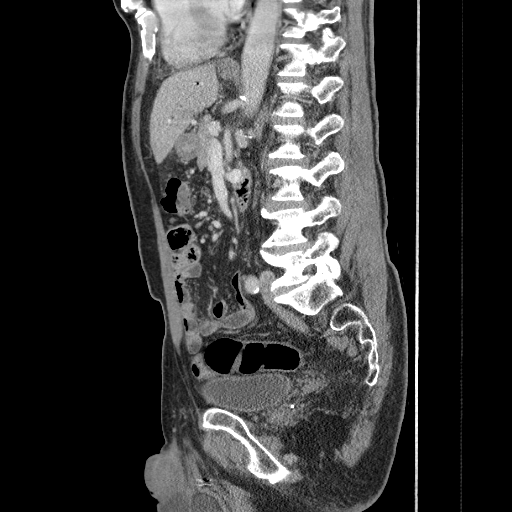

[12 of 36 positions shown; findings below may reference images not displayed]

FINDINGS: Lower chest: Irregular density anteriorly in the right lower lobe on
image number 7 is unchanged from the baseline examination and likely
represents scarring. Small right lower lobe nodule on image number 6
and lingular nodule on image number 7 are stable.

Hepatobiliary: Fluid collection within the cholecystectomy/partial
hepatectomy bed has nearly completely resolved. There is no residual
extraluminal air in this area or definite residual mass. Plastic
biliary stent remains in place, extending into the left hepatic
lobe. There is stable pneumobilia. Mild periportal edema is noted
within the liver. No focal hepatic lesion identified.

Pancreas: Prominence of the pancreatic head noted on the most recent
examination has resolved. No definite focal abnormality or
surrounding inflammation.

Spleen: Normal in size without focal abnormality.

Adrenals/Urinary Tract: Stable small right adrenal nodule, likely an
adenoma. The left adrenal gland appears normal.Stable bilateral
renal sinus cysts. No evidence of urinary tract calculus or
hydronephrosis. The bladder demonstrates no significant findings.

Stomach/Bowel: No evidence of bowel wall thickening, distention or
surrounding inflammatory change. Postsurgical changes are noted
within the small bowel in the left abdomen. No ascites or focal
extraluminal fluid collection.

Vascular/Lymphatic: There are no enlarged abdominal or pelvic lymph
nodes. Stable mild aortoiliac atherosclerosis.

Reproductive: Stable mild enlargement of the prostate gland with
associated dystrophic calcifications. There are probable bilateral
scrotal hydroceles.

Other: Resolving postsurgical changes within the anterior abdominal
wall. Ossification inferior to the xiphoid process appears
unchanged.

Musculoskeletal: No acute or significant osseous findings.
IMPRESSION: 1. Resolving postsurgical changes status post cholecystectomy and
partial hepatectomy. No significant residual fluid collection
demonstrated.
2. Persistent pneumobilia status post biliary stenting.
3. No evidence of metastatic disease.
4. Stable findings at the lung bases from baseline examination of 4
months ago. Attention on follow-up recommended.
5. Stable small right adrenal nodule.

## 2016-04-15 ENCOUNTER — Encounter (HOSPITAL_COMMUNITY): Payer: Medicare Other

## 2016-04-15 ENCOUNTER — Ambulatory Visit: Payer: Medicare Other | Admitting: Vascular Surgery

## 2016-05-15 ENCOUNTER — Other Ambulatory Visit: Payer: Self-pay | Admitting: Nurse Practitioner

## 2016-10-11 IMAGING — US US EXTREM  UP VENOUS*R*
1 series · 13 of 24 positions shown · non-contrast
Comparison: None.

CLINICAL DATA: 75-year-old male with a history of upper extremity
swelling and DVT.



[Series 1: us extrem up venous*right* · 0.07mm/px · 47 acquisitions, 13 frames shown]
[im 1/47]
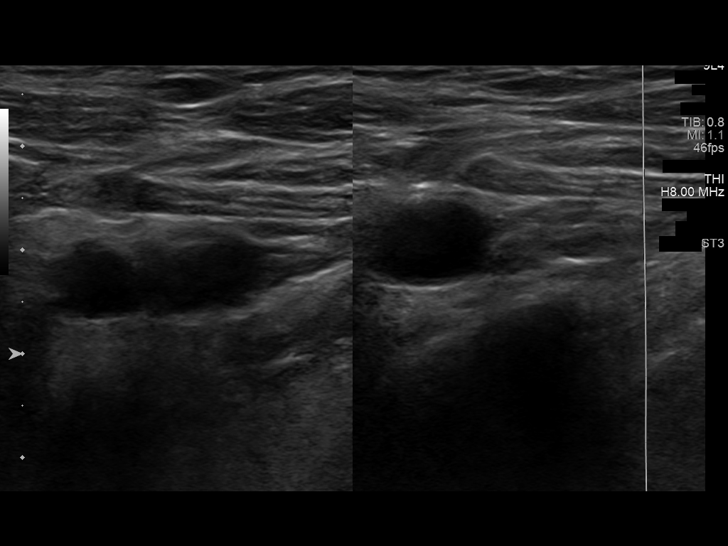
[im 5/47]
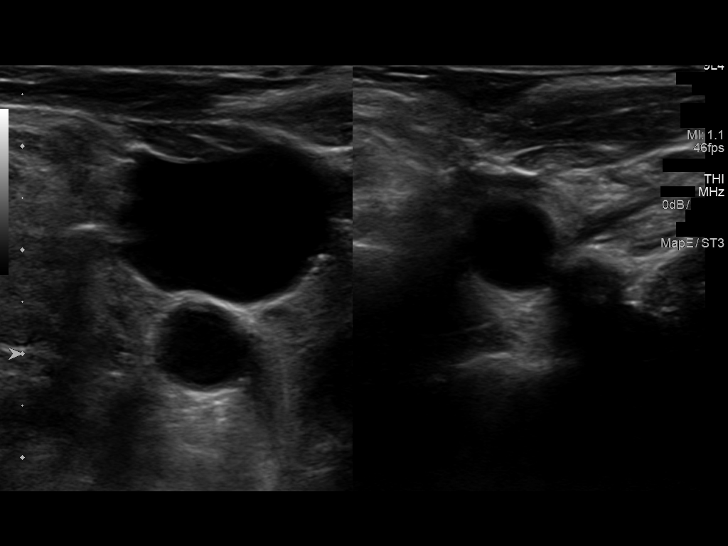
[im 9/47]
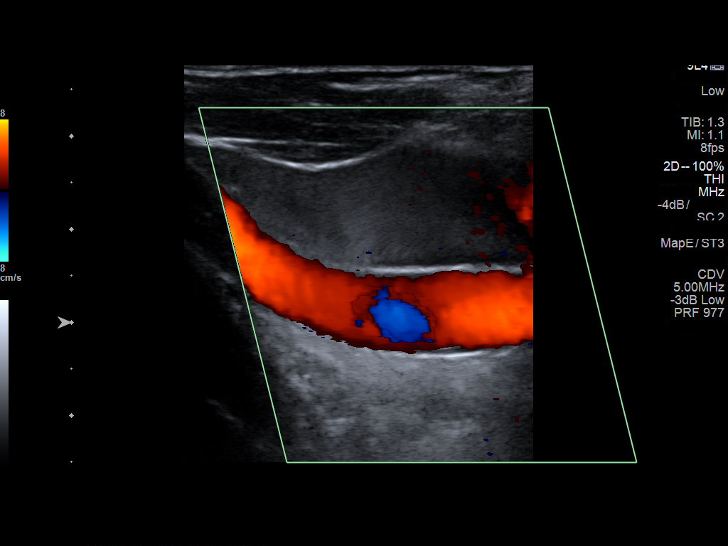
[im 11/47]
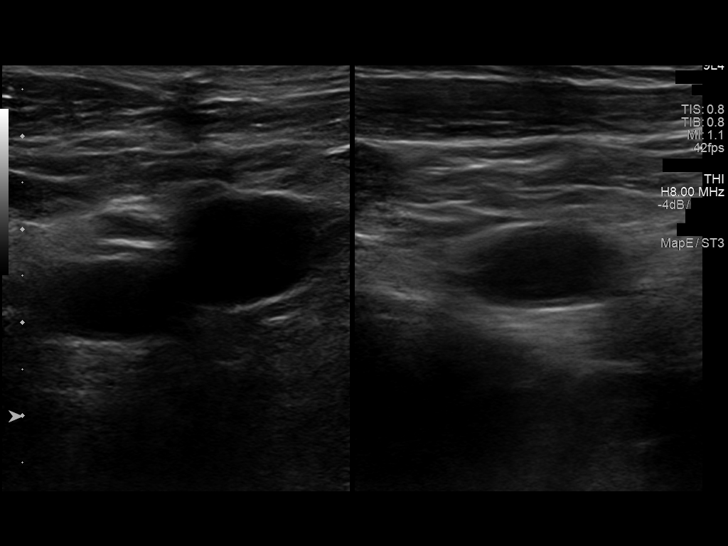
[im 15/47]
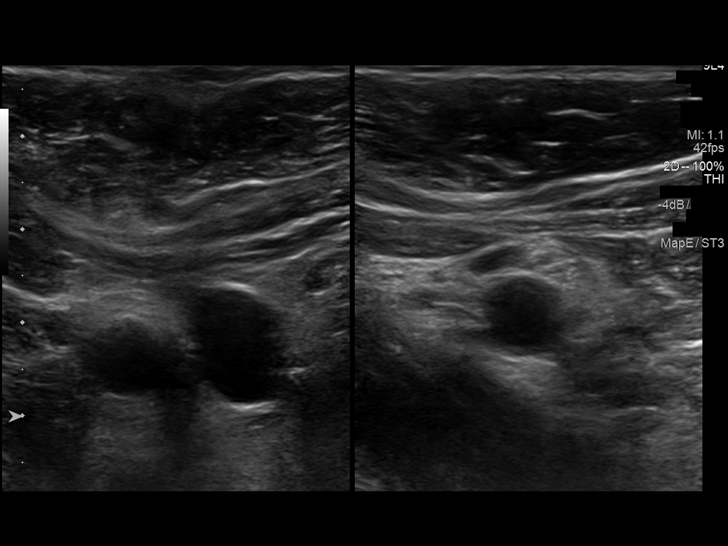
[im 19/47]
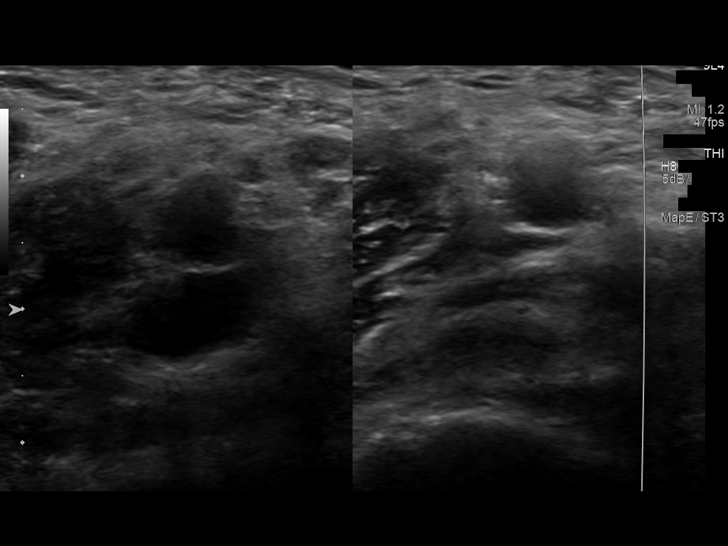
[im 25/47]
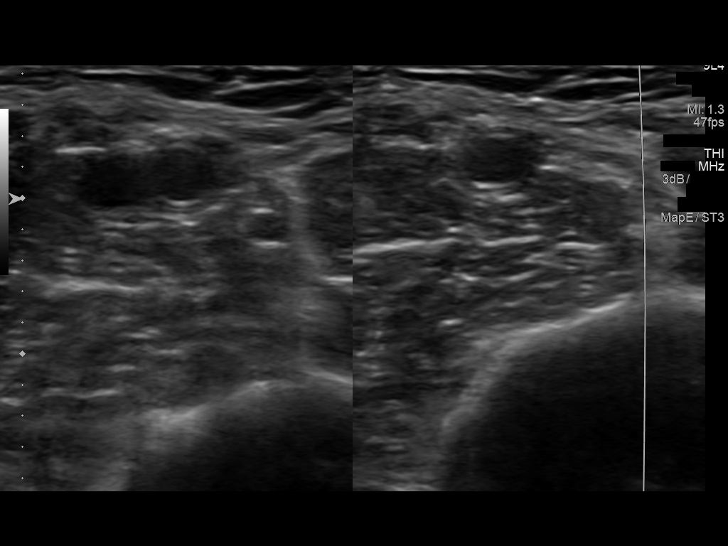
[im 27/47]
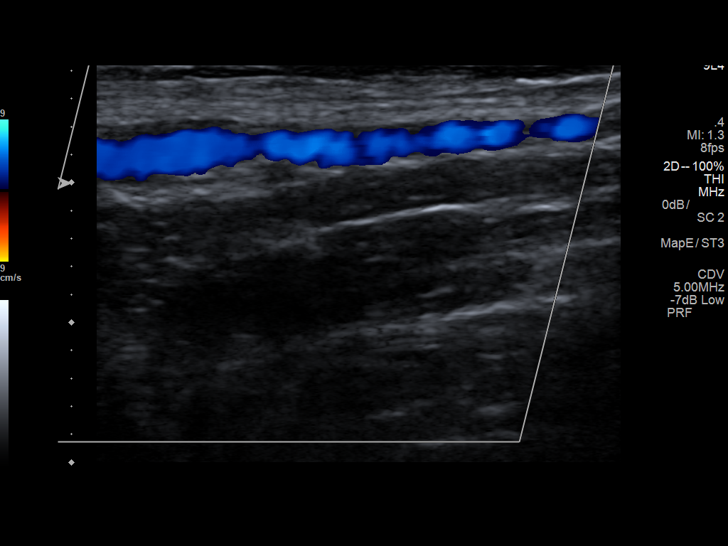
[im 31/47]
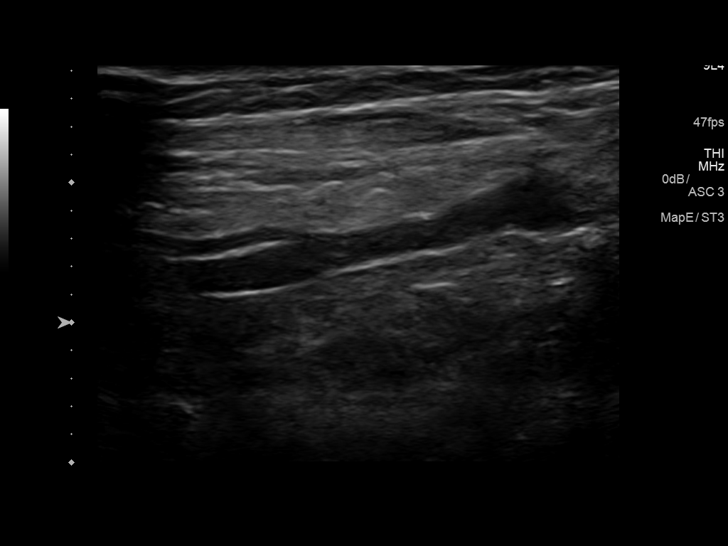
[im 35/47]
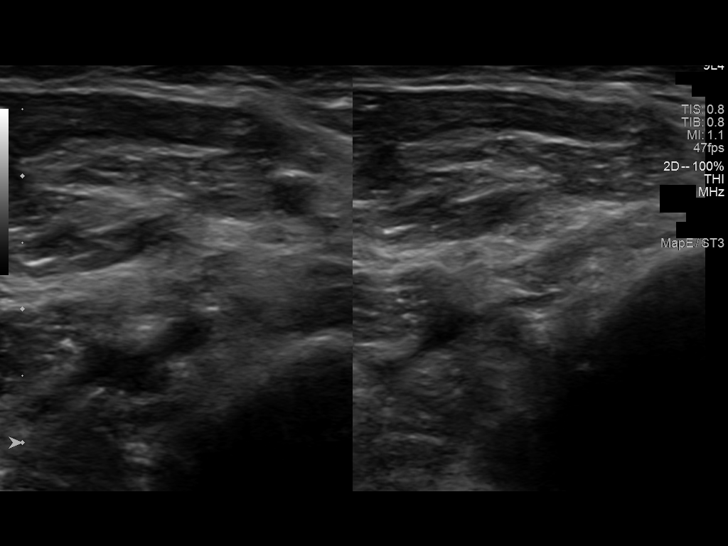
[im 39/47]
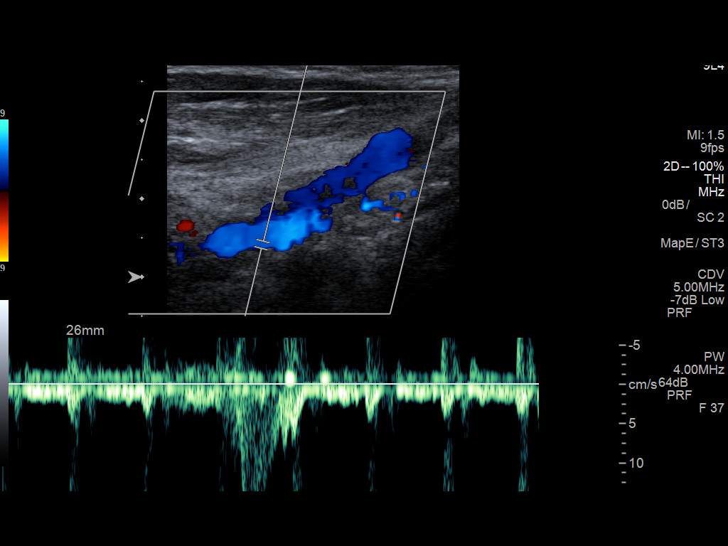
[im 43/47]
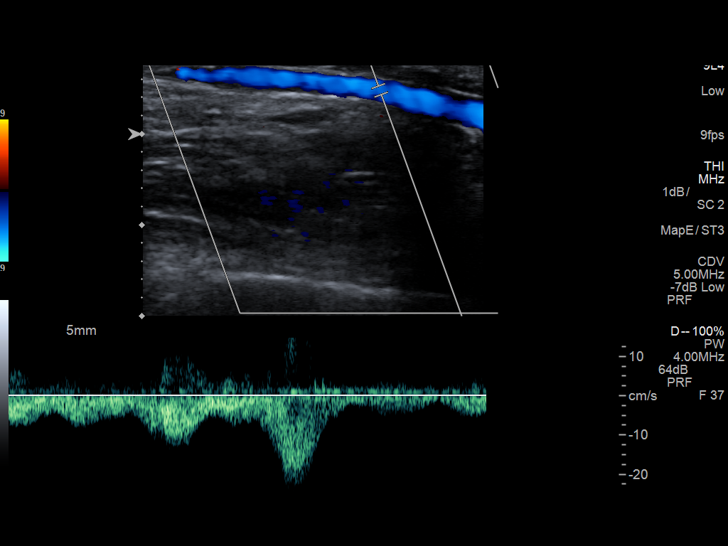
[im 47/47]
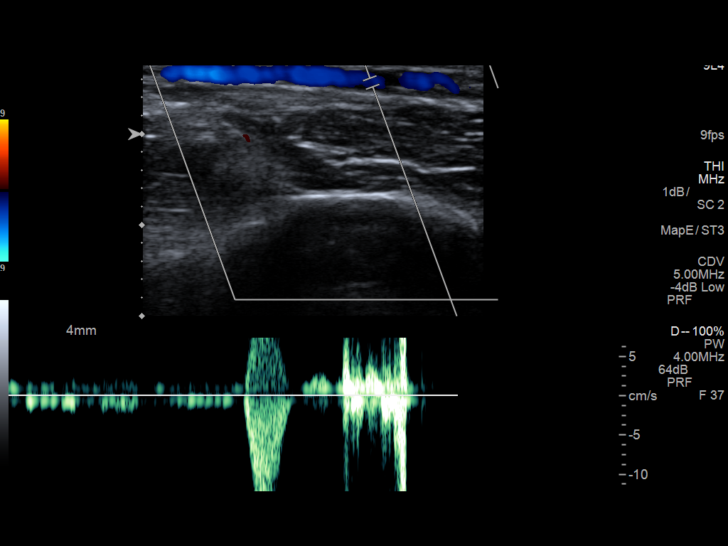

[13 of 24 positions shown; findings below may reference images not displayed]

FINDINGS: Contralateral Subclavian Vein: Respiratory phasicity is normal and
symmetric with the symptomatic side. No evidence of thrombus. Normal
compressibility.

Internal Jugular Vein: No evidence of thrombus. Normal
compressibility, respiratory phasicity and response to augmentation.

Subclavian Vein: No evidence of thrombus. Normal compressibility,
respiratory phasicity and response to augmentation.

Axillary Vein: No evidence of thrombus. Normal compressibility,
respiratory phasicity and response to augmentation.

Cephalic Vein: No evidence of thrombus. Normal compressibility,
respiratory phasicity and response to augmentation.

Basilic Vein: No evidence of thrombus. Normal compressibility,
respiratory phasicity and response to augmentation.

Brachial Veins: No evidence of thrombus. Normal compressibility,
respiratory phasicity and response to augmentation.

Radial Veins: No evidence of thrombus. Normal compressibility,
respiratory phasicity and response to augmentation.

Ulnar Veins: No evidence of thrombus. Normal compressibility,
respiratory phasicity and response to augmentation.

Other Findings:  None visualized.
IMPRESSION: Sonographic survey of the right upper extremity negative for DVT.

## 2018-10-22 ENCOUNTER — Encounter
